# Patient Record
Sex: Male | Born: 1945
Health system: Southern US, Community
[De-identification: ages and names within clinical notes are randomized; demographics above are authoritative.]

## PROBLEM LIST (undated history)

## (undated) DIAGNOSIS — E785 Hyperlipidemia, unspecified: Secondary | ICD-10-CM

## (undated) DIAGNOSIS — I2510 Atherosclerotic heart disease of native coronary artery without angina pectoris: Secondary | ICD-10-CM

## (undated) DIAGNOSIS — I219 Acute myocardial infarction, unspecified: Secondary | ICD-10-CM

## (undated) DIAGNOSIS — I10 Essential (primary) hypertension: Secondary | ICD-10-CM

## (undated) HISTORY — PX: CORONARY ARTERY BYPASS GRAFT: SHX141

---

## 2000-09-10 ENCOUNTER — Encounter: Payer: Self-pay | Admitting: General Surgery

## 2000-09-12 ENCOUNTER — Ambulatory Visit (HOSPITAL_COMMUNITY): Admission: RE | Admit: 2000-09-12 | Discharge: 2000-09-12 | Payer: Self-pay | Admitting: General Surgery

## 2014-06-13 ENCOUNTER — Other Ambulatory Visit: Payer: Self-pay

## 2014-06-13 ENCOUNTER — Other Ambulatory Visit: Payer: Self-pay | Admitting: Surgery

## 2014-06-13 DIAGNOSIS — R222 Localized swelling, mass and lump, trunk: Secondary | ICD-10-CM

## 2014-06-13 DIAGNOSIS — M549 Dorsalgia, unspecified: Secondary | ICD-10-CM

## 2014-06-13 DIAGNOSIS — R1012 Left upper quadrant pain: Secondary | ICD-10-CM

## 2014-06-17 ENCOUNTER — Ambulatory Visit
Admission: RE | Admit: 2014-06-17 | Discharge: 2014-06-17 | Disposition: A | Discharge status: Home / Self Care | Payer: Medicare Other | Source: Ambulatory Visit | Attending: Surgery | Admitting: Surgery

## 2014-06-17 MED ORDER — IOPAMIDOL (ISOVUE-300) INJECTION 61%
100.0000 mL | Freq: Once | INTRAVENOUS | Status: AC | PRN
  Administered 2014-06-17: 100 mL via INTRAVENOUS

## 2014-07-20 ENCOUNTER — Telehealth: Payer: Self-pay

## 2014-07-20 NOTE — Telephone Encounter (Signed)
Rec'd from [place] 6 pages to Historical Provider

## 2014-07-21 ENCOUNTER — Telehealth: Payer: Self-pay | Admitting: Internal Medicine

## 2014-07-21 NOTE — Telephone Encounter (Signed)
Records received from [facility] and placed on Dr. Celesta Aver desk for review

## 2014-08-08 ENCOUNTER — Encounter: Payer: Self-pay | Admitting: Internal Medicine

## 2014-10-13 ENCOUNTER — Ambulatory Visit: Payer: Medicare Other | Admitting: Internal Medicine

## 2015-06-23 DIAGNOSIS — H02823 Cysts of right eye, unspecified eyelid: Secondary | ICD-10-CM | POA: Diagnosis not present

## 2016-08-08 DIAGNOSIS — D2261 Melanocytic nevi of right upper limb, including shoulder: Secondary | ICD-10-CM | POA: Diagnosis not present

## 2016-08-08 DIAGNOSIS — D485 Neoplasm of uncertain behavior of skin: Secondary | ICD-10-CM | POA: Diagnosis not present

## 2016-08-08 DIAGNOSIS — L720 Epidermal cyst: Secondary | ICD-10-CM | POA: Diagnosis not present

## 2016-08-08 DIAGNOSIS — L570 Actinic keratosis: Secondary | ICD-10-CM | POA: Diagnosis not present

## 2016-08-08 DIAGNOSIS — C44319 Basal cell carcinoma of skin of other parts of face: Secondary | ICD-10-CM | POA: Diagnosis not present

## 2016-08-08 DIAGNOSIS — L821 Other seborrheic keratosis: Secondary | ICD-10-CM | POA: Diagnosis not present

## 2016-09-05 DIAGNOSIS — Z85828 Personal history of other malignant neoplasm of skin: Secondary | ICD-10-CM | POA: Diagnosis not present

## 2016-09-05 DIAGNOSIS — C44319 Basal cell carcinoma of skin of other parts of face: Secondary | ICD-10-CM | POA: Diagnosis not present

## 2016-12-16 ENCOUNTER — Inpatient Hospital Stay (HOSPITAL_COMMUNITY)
Admission: EM | Admit: 2016-12-16 | Discharge: 2016-12-19 | DRG: 280 | Disposition: A | Discharge status: Home Health | Payer: Medicare Other | Attending: Cardiology | Admitting: Cardiology

## 2016-12-16 ENCOUNTER — Emergency Department (HOSPITAL_COMMUNITY): Payer: Medicare Other

## 2016-12-16 ENCOUNTER — Encounter (HOSPITAL_COMMUNITY): Payer: Self-pay | Admitting: Pharmacy Technician

## 2016-12-16 ENCOUNTER — Inpatient Hospital Stay (HOSPITAL_COMMUNITY)
Admission: EM | Disposition: A | Discharge status: Home Health | Payer: Self-pay | Source: Home / Self Care | Attending: Cardiology

## 2016-12-16 DIAGNOSIS — I214 Non-ST elevation (NSTEMI) myocardial infarction: Secondary | ICD-10-CM | POA: Diagnosis not present

## 2016-12-16 DIAGNOSIS — R079 Chest pain, unspecified: Secondary | ICD-10-CM | POA: Diagnosis not present

## 2016-12-16 DIAGNOSIS — E782 Mixed hyperlipidemia: Secondary | ICD-10-CM | POA: Diagnosis not present

## 2016-12-16 DIAGNOSIS — I5021 Acute systolic (congestive) heart failure: Secondary | ICD-10-CM | POA: Diagnosis not present

## 2016-12-16 DIAGNOSIS — Z8249 Family history of ischemic heart disease and other diseases of the circulatory system: Secondary | ICD-10-CM | POA: Diagnosis not present

## 2016-12-16 DIAGNOSIS — E785 Hyperlipidemia, unspecified: Secondary | ICD-10-CM | POA: Diagnosis present

## 2016-12-16 DIAGNOSIS — N179 Acute kidney failure, unspecified: Secondary | ICD-10-CM | POA: Diagnosis not present

## 2016-12-16 DIAGNOSIS — I161 Hypertensive emergency: Secondary | ICD-10-CM | POA: Diagnosis not present

## 2016-12-16 DIAGNOSIS — I4729 Other ventricular tachycardia: Secondary | ICD-10-CM

## 2016-12-16 DIAGNOSIS — I160 Hypertensive urgency: Secondary | ICD-10-CM | POA: Diagnosis present

## 2016-12-16 DIAGNOSIS — Z951 Presence of aortocoronary bypass graft: Secondary | ICD-10-CM | POA: Diagnosis not present

## 2016-12-16 DIAGNOSIS — I25110 Atherosclerotic heart disease of native coronary artery with unstable angina pectoris: Secondary | ICD-10-CM | POA: Diagnosis not present

## 2016-12-16 DIAGNOSIS — I340 Nonrheumatic mitral (valve) insufficiency: Secondary | ICD-10-CM | POA: Diagnosis present

## 2016-12-16 DIAGNOSIS — G92 Toxic encephalopathy: Secondary | ICD-10-CM | POA: Diagnosis not present

## 2016-12-16 DIAGNOSIS — I2510 Atherosclerotic heart disease of native coronary artery without angina pectoris: Secondary | ICD-10-CM | POA: Diagnosis not present

## 2016-12-16 DIAGNOSIS — F17210 Nicotine dependence, cigarettes, uncomplicated: Secondary | ICD-10-CM | POA: Diagnosis present

## 2016-12-16 DIAGNOSIS — T4275XA Adverse effect of unspecified antiepileptic and sedative-hypnotic drugs, initial encounter: Secondary | ICD-10-CM | POA: Diagnosis not present

## 2016-12-16 DIAGNOSIS — E1165 Type 2 diabetes mellitus with hyperglycemia: Secondary | ICD-10-CM | POA: Diagnosis present

## 2016-12-16 DIAGNOSIS — I249 Acute ischemic heart disease, unspecified: Secondary | ICD-10-CM

## 2016-12-16 DIAGNOSIS — I252 Old myocardial infarction: Secondary | ICD-10-CM

## 2016-12-16 DIAGNOSIS — I440 Atrioventricular block, first degree: Secondary | ICD-10-CM | POA: Diagnosis present

## 2016-12-16 DIAGNOSIS — Z720 Tobacco use: Secondary | ICD-10-CM | POA: Diagnosis not present

## 2016-12-16 DIAGNOSIS — I472 Ventricular tachycardia, unspecified: Secondary | ICD-10-CM

## 2016-12-16 DIAGNOSIS — I213 ST elevation (STEMI) myocardial infarction of unspecified site: Secondary | ICD-10-CM

## 2016-12-16 DIAGNOSIS — E876 Hypokalemia: Secondary | ICD-10-CM | POA: Diagnosis not present

## 2016-12-16 DIAGNOSIS — N183 Chronic kidney disease, stage 3 (moderate): Secondary | ICD-10-CM | POA: Diagnosis present

## 2016-12-16 DIAGNOSIS — I130 Hypertensive heart and chronic kidney disease with heart failure and stage 1 through stage 4 chronic kidney disease, or unspecified chronic kidney disease: Principal | ICD-10-CM | POA: Diagnosis present

## 2016-12-16 DIAGNOSIS — I25810 Atherosclerosis of coronary artery bypass graft(s) without angina pectoris: Secondary | ICD-10-CM | POA: Diagnosis present

## 2016-12-16 DIAGNOSIS — I493 Ventricular premature depolarization: Secondary | ICD-10-CM | POA: Diagnosis present

## 2016-12-16 DIAGNOSIS — Z978 Presence of other specified devices: Secondary | ICD-10-CM

## 2016-12-16 DIAGNOSIS — Z9119 Patient's noncompliance with other medical treatment and regimen: Secondary | ICD-10-CM

## 2016-12-16 DIAGNOSIS — Z4682 Encounter for fitting and adjustment of non-vascular catheter: Secondary | ICD-10-CM | POA: Diagnosis not present

## 2016-12-16 DIAGNOSIS — J9690 Respiratory failure, unspecified, unspecified whether with hypoxia or hypercapnia: Secondary | ICD-10-CM | POA: Diagnosis present

## 2016-12-16 DIAGNOSIS — E1122 Type 2 diabetes mellitus with diabetic chronic kidney disease: Secondary | ICD-10-CM | POA: Diagnosis present

## 2016-12-16 DIAGNOSIS — J9601 Acute respiratory failure with hypoxia: Secondary | ICD-10-CM | POA: Diagnosis present

## 2016-12-16 DIAGNOSIS — D72829 Elevated white blood cell count, unspecified: Secondary | ICD-10-CM | POA: Diagnosis present

## 2016-12-16 HISTORY — DX: Essential (primary) hypertension: I10

## 2016-12-16 HISTORY — DX: Atherosclerotic heart disease of native coronary artery without angina pectoris: I25.10

## 2016-12-16 HISTORY — DX: Acute myocardial infarction, unspecified: I21.9

## 2016-12-16 HISTORY — DX: Hyperlipidemia, unspecified: E78.5

## 2016-12-16 HISTORY — PX: LEFT HEART CATH AND CORONARY ANGIOGRAPHY: CATH118249

## 2016-12-16 LAB — POCT I-STAT 3, ART BLOOD GAS (G3+)
ACID-BASE DEFICIT: 3 mmol/L — AB (ref 0.0–2.0)
BICARBONATE: 23.3 mmol/L (ref 20.0–28.0)
O2 Saturation: 100 %
PH ART: 7.342 — AB (ref 7.350–7.450)
PO2 ART: 179 mmHg — AB (ref 83.0–108.0)
Patient temperature: 98.1
TCO2: 25 mmol/L (ref 22–32)
pCO2 arterial: 42.9 mmHg (ref 32.0–48.0)

## 2016-12-16 LAB — URINALYSIS, ROUTINE W REFLEX MICROSCOPIC
Bilirubin Urine: NEGATIVE
GLUCOSE, UA: NEGATIVE mg/dL
Hgb urine dipstick: NEGATIVE
KETONES UR: NEGATIVE mg/dL
LEUKOCYTES UA: NEGATIVE
NITRITE: NEGATIVE
PROTEIN: NEGATIVE mg/dL
Specific Gravity, Urine: 1.019 (ref 1.005–1.030)
pH: 5 (ref 5.0–8.0)

## 2016-12-16 LAB — POCT ACTIVATED CLOTTING TIME: Activated Clotting Time: 109 seconds

## 2016-12-16 LAB — LIPID PANEL
Cholesterol: 232 mg/dL — ABNORMAL HIGH (ref 0–200)
HDL: 35 mg/dL — ABNORMAL LOW (ref >40)
LDL Cholesterol: 176 mg/dL — ABNORMAL HIGH (ref 0–99)
TRIGLYCERIDES: 106 mg/dL (ref <150)
Total CHOL/HDL Ratio: 6.6 RATIO
VLDL: 21 mg/dL (ref 0–40)

## 2016-12-16 LAB — CBC WITH DIFFERENTIAL/PLATELET
Basophils Absolute: 0.1 10*3/uL (ref 0.0–0.1)
Basophils Relative: 1 %
EOS ABS: 0.1 10*3/uL (ref 0.0–0.7)
Eosinophils Relative: 0 %
HEMATOCRIT: 55.1 % — AB (ref 39.0–52.0)
HEMOGLOBIN: 18.4 g/dL — AB (ref 13.0–17.0)
LYMPHS ABS: 1.4 10*3/uL (ref 0.7–4.0)
LYMPHS PCT: 12 %
MCH: 32 pg (ref 26.0–34.0)
MCHC: 33.4 g/dL (ref 30.0–36.0)
MCV: 95.8 fL (ref 78.0–100.0)
MONOS PCT: 6 %
Monocytes Absolute: 0.7 10*3/uL (ref 0.1–1.0)
NEUTROS PCT: 81 %
Neutro Abs: 9.4 10*3/uL — ABNORMAL HIGH (ref 1.7–7.7)
Platelets: 153 10*3/uL (ref 150–400)
RBC: 5.75 MIL/uL (ref 4.22–5.81)
RDW: 13.5 % (ref 11.5–15.5)
WBC: 11.6 10*3/uL — AB (ref 4.0–10.5)

## 2016-12-16 LAB — TRIGLYCERIDES: Triglycerides: 105 mg/dL (ref <150)

## 2016-12-16 LAB — GLUCOSE, CAPILLARY
GLUCOSE-CAPILLARY: 219 mg/dL — AB (ref 65–99)
GLUCOSE-CAPILLARY: 150 mg/dL — AB (ref 65–99)

## 2016-12-16 LAB — COMPREHENSIVE METABOLIC PANEL
ALBUMIN: 4.4 g/dL (ref 3.5–5.0)
ALK PHOS: 126 U/L (ref 38–126)
ALT: 33 U/L (ref 17–63)
ANION GAP: 15 (ref 5–15)
AST: 31 U/L (ref 15–41)
BILIRUBIN TOTAL: 1.2 mg/dL (ref 0.3–1.2)
BUN: 9 mg/dL (ref 6–20)
CALCIUM: 9.1 mg/dL (ref 8.9–10.3)
CO2: 19 mmol/L — AB (ref 22–32)
CREATININE: 1.43 mg/dL — AB (ref 0.61–1.24)
Chloride: 101 mmol/L (ref 101–111)
GFR calc Af Amer: 55 mL/min — ABNORMAL LOW (ref >60)
GFR calc non Af Amer: 48 mL/min — ABNORMAL LOW (ref >60)
GLUCOSE: 219 mg/dL — AB (ref 65–99)
Potassium: 4.3 mmol/L (ref 3.5–5.1)
SODIUM: 135 mmol/L (ref 135–145)
TOTAL PROTEIN: 7.6 g/dL (ref 6.5–8.1)

## 2016-12-16 LAB — POCT I-STAT, CHEM 8
BUN: 13 mg/dL (ref 6–20)
CHLORIDE: 103 mmol/L (ref 101–111)
CREATININE: 1.2 mg/dL (ref 0.61–1.24)
Calcium, Ion: 1.14 mmol/L — ABNORMAL LOW (ref 1.15–1.40)
Glucose, Bld: 314 mg/dL — ABNORMAL HIGH (ref 65–99)
HEMATOCRIT: 51 % (ref 39.0–52.0)
Hemoglobin: 17.3 g/dL — ABNORMAL HIGH (ref 13.0–17.0)
Potassium: 4.5 mmol/L (ref 3.5–5.1)
Sodium: 137 mmol/L (ref 135–145)
TCO2: 23 mmol/L (ref 22–32)

## 2016-12-16 LAB — MAGNESIUM: MAGNESIUM: 2.1 mg/dL (ref 1.7–2.4)

## 2016-12-16 LAB — APTT: aPTT: 28 seconds (ref 24–36)

## 2016-12-16 LAB — PROTIME-INR
INR: 1.02
Prothrombin Time: 13.3 seconds (ref 11.4–15.2)

## 2016-12-16 LAB — HEMOGLOBIN A1C
Hgb A1c MFr Bld: 7 % — ABNORMAL HIGH (ref 4.8–5.6)
Mean Plasma Glucose: 154.2 mg/dL

## 2016-12-16 LAB — TROPONIN I: Troponin I: 0.05 ng/mL (ref <0.03)

## 2016-12-16 SURGERY — LEFT HEART CATH AND CORONARY ANGIOGRAPHY
Anesthesia: LOCAL

## 2016-12-16 MED ORDER — PROPOFOL 1000 MG/100ML IV EMUL
5.0000 ug/kg/min | INTRAVENOUS | Status: DC
  Filled 2016-12-16: qty 100

## 2016-12-16 MED ORDER — ACETAMINOPHEN 325 MG PO TABS
650.0000 mg | ORAL_TABLET | ORAL | Status: DC | PRN
  Administered 2016-12-17 – 2016-12-18 (×3): 650 mg via ORAL
  Filled 2016-12-16 (×3): qty 2

## 2016-12-16 MED ORDER — FENTANYL CITRATE (PF) 100 MCG/2ML IJ SOLN: 50.0000 ug | INTRAMUSCULAR | Status: DC | PRN

## 2016-12-16 MED ORDER — SODIUM CHLORIDE 0.9 % IV SOLN: 250.0000 mL | INTRAVENOUS | Status: DC | PRN

## 2016-12-16 MED ORDER — ASPIRIN 81 MG PO CHEW: 324.0000 mg | CHEWABLE_TABLET | Freq: Once | ORAL | Status: DC

## 2016-12-16 MED ORDER — HEPARIN SODIUM (PORCINE) 5000 UNIT/ML IJ SOLN
4000.0000 [IU] | Freq: Once | INTRAMUSCULAR | Status: AC
  Administered 2016-12-16: 4000 [IU] via INTRAVENOUS

## 2016-12-16 MED ORDER — PROPOFOL 1000 MG/100ML IV EMUL
INTRAVENOUS | Status: AC
  Administered 2016-12-16 (×2)
  Filled 2016-12-16: qty 100

## 2016-12-16 MED ORDER — PROPOFOL 1000 MG/100ML IV EMUL
5.0000 ug/kg/min | INTRAVENOUS | Status: DC
  Administered 2016-12-16: 20 ug/kg/min via INTRAVENOUS
  Administered 2016-12-16: 30 ug/kg/min via INTRAVENOUS
  Administered 2016-12-16: 40 ug/kg/min via INTRAVENOUS
  Administered 2016-12-17: 30 ug/kg/min via INTRAVENOUS
  Filled 2016-12-16 (×2): qty 100

## 2016-12-16 MED ORDER — CHLORHEXIDINE GLUCONATE 0.12% ORAL RINSE (MEDLINE KIT)
15.0000 mL | Freq: Two times a day (BID) | OROMUCOSAL | Status: DC
  Administered 2016-12-16 – 2016-12-18 (×3): 15 mL via OROMUCOSAL

## 2016-12-16 MED ORDER — IOPAMIDOL (ISOVUE-370) INJECTION 76%
INTRAVENOUS | Status: DC | PRN
  Administered 2016-12-16: 135 mL via INTRA_ARTERIAL

## 2016-12-16 MED ORDER — NITROGLYCERIN 1 MG/10 ML FOR IR/CATH LAB
INTRA_ARTERIAL | Status: AC
  Filled 2016-12-16: qty 10

## 2016-12-16 MED ORDER — VERAPAMIL HCL 2.5 MG/ML IV SOLN
INTRAVENOUS | Status: AC
  Filled 2016-12-16: qty 2

## 2016-12-16 MED ORDER — IOPAMIDOL (ISOVUE-370) INJECTION 76%
INTRAVENOUS | Status: AC
Start: 2016-12-16 — End: ?
  Filled 2016-12-16: qty 100

## 2016-12-16 MED ORDER — LIDOCAINE HCL (PF) 1 % IJ SOLN
INTRAMUSCULAR | Status: DC | PRN
  Administered 2016-12-16: 15 mL via INTRADERMAL

## 2016-12-16 MED ORDER — SUCCINYLCHOLINE CHLORIDE 20 MG/ML IJ SOLN
INTRAMUSCULAR | Status: AC | PRN
  Administered 2016-12-16: 100 mg via INTRAVENOUS

## 2016-12-16 MED ORDER — SUCCINYLCHOLINE CHLORIDE 20 MG/ML IJ SOLN
100.0000 mg | Freq: Once | INTRAMUSCULAR | Status: AC
  Administered 2016-12-16: 100 mg via INTRAVENOUS

## 2016-12-16 MED ORDER — ETOMIDATE 2 MG/ML IV SOLN
20.0000 mg | Freq: Once | INTRAVENOUS | Status: AC
  Administered 2016-12-16: 20 mg via INTRAVENOUS

## 2016-12-16 MED ORDER — PANTOPRAZOLE SODIUM 40 MG IV SOLR
40.0000 mg | Freq: Every day | INTRAVENOUS | Status: DC
  Administered 2016-12-16: 40 mg via INTRAVENOUS
  Filled 2016-12-16: qty 40

## 2016-12-16 MED ORDER — SODIUM CHLORIDE 0.9 % IV SOLN
INTRAVENOUS | Status: DC
  Administered 2016-12-16 (×2): via INTRAVENOUS
  Administered 2016-12-16: 10 mL/h via INTRAVENOUS

## 2016-12-16 MED ORDER — METOPROLOL TARTRATE 5 MG/5ML IV SOLN
5.0000 mg | Freq: Once | INTRAVENOUS | Status: AC
  Administered 2016-12-16: 5 mg via INTRAVENOUS

## 2016-12-16 MED ORDER — NITROGLYCERIN IN D5W 200-5 MCG/ML-% IV SOLN
INTRAVENOUS | Status: AC
  Filled 2016-12-16: qty 250

## 2016-12-16 MED ORDER — ONDANSETRON HCL 4 MG/2ML IJ SOLN: 4.0000 mg | Freq: Four times a day (QID) | INTRAMUSCULAR | Status: DC | PRN

## 2016-12-16 MED ORDER — BISACODYL 10 MG RE SUPP: 10.0000 mg | Freq: Every day | RECTAL | Status: DC | PRN

## 2016-12-16 MED ORDER — HEPARIN (PORCINE) IN NACL 100-0.45 UNIT/ML-% IJ SOLN
1500.0000 [IU]/h | INTRAMUSCULAR | Status: DC
  Administered 2016-12-17: 1250 [IU]/h via INTRAVENOUS
  Administered 2016-12-17: 1500 [IU]/h via INTRAVENOUS
  Filled 2016-12-16 (×2): qty 250

## 2016-12-16 MED ORDER — HEPARIN (PORCINE) IN NACL 2-0.9 UNIT/ML-% IJ SOLN
INTRAMUSCULAR | Status: AC | PRN
  Administered 2016-12-16: 1500 mL

## 2016-12-16 MED ORDER — SODIUM CHLORIDE 0.9% FLUSH: 3.0000 mL | INTRAVENOUS | Status: DC | PRN

## 2016-12-16 MED ORDER — NITROGLYCERIN IN D5W 200-5 MCG/ML-% IV SOLN
10.0000 ug/min | INTRAVENOUS | Status: DC
  Administered 2016-12-16: 10 ug/min via INTRAVENOUS

## 2016-12-16 MED ORDER — HEPARIN (PORCINE) IN NACL 2-0.9 UNIT/ML-% IJ SOLN
INTRAMUSCULAR | Status: AC
  Filled 2016-12-16: qty 1000

## 2016-12-16 MED ORDER — FUROSEMIDE 10 MG/ML IJ SOLN
40.0000 mg | Freq: Once | INTRAMUSCULAR | Status: AC
  Administered 2016-12-16: 40 mg via INTRAVENOUS

## 2016-12-16 MED ORDER — IPRATROPIUM-ALBUTEROL 0.5-2.5 (3) MG/3ML IN SOLN: 3.0000 mL | RESPIRATORY_TRACT | Status: DC | PRN

## 2016-12-16 MED ORDER — AMIODARONE HCL IN DEXTROSE 360-4.14 MG/200ML-% IV SOLN
30.0000 mg/h | INTRAVENOUS | Status: DC
  Administered 2016-12-17: 30 mg/h via INTRAVENOUS
  Filled 2016-12-16: qty 200

## 2016-12-16 MED ORDER — LIDOCAINE HCL (PF) 1 % IJ SOLN
INTRAMUSCULAR | Status: AC
  Filled 2016-12-16: qty 30

## 2016-12-16 MED ORDER — ATORVASTATIN CALCIUM 80 MG PO TABS
80.0000 mg | ORAL_TABLET | Freq: Every day | ORAL | Status: DC
  Administered 2016-12-17 – 2016-12-18 (×2): 80 mg via ORAL
  Filled 2016-12-16 (×2): qty 1

## 2016-12-16 MED ORDER — AMIODARONE HCL IN DEXTROSE 360-4.14 MG/200ML-% IV SOLN
60.0000 mg/h | INTRAVENOUS | Status: AC
  Administered 2016-12-16 (×2): 60 mg/h via INTRAVENOUS
  Filled 2016-12-16: qty 200

## 2016-12-16 MED ORDER — AMIODARONE LOAD VIA INFUSION
150.0000 mg | Freq: Once | INTRAVENOUS | Status: AC
  Administered 2016-12-16: 150 mg via INTRAVENOUS
  Filled 2016-12-16: qty 83.34

## 2016-12-16 MED ORDER — ASPIRIN EC 81 MG PO TBEC
81.0000 mg | DELAYED_RELEASE_TABLET | Freq: Every day | ORAL | Status: DC
  Administered 2016-12-17 – 2016-12-19 (×3): 81 mg via ORAL
  Filled 2016-12-16 (×3): qty 1

## 2016-12-16 MED ORDER — IOPAMIDOL (ISOVUE-370) INJECTION 76%
INTRAVENOUS | Status: AC
  Filled 2016-12-16: qty 125

## 2016-12-16 MED ORDER — DOCUSATE SODIUM 50 MG/5ML PO LIQD
100.0000 mg | Freq: Two times a day (BID) | ORAL | Status: DC | PRN
  Filled 2016-12-16: qty 10

## 2016-12-16 MED ORDER — FUROSEMIDE 10 MG/ML IJ SOLN
40.0000 mg | Freq: Two times a day (BID) | INTRAMUSCULAR | Status: DC
  Administered 2016-12-16 – 2016-12-17 (×2): 40 mg via INTRAVENOUS
  Filled 2016-12-16 (×2): qty 4

## 2016-12-16 MED ORDER — NITROGLYCERIN 0.4 MG SL SUBL: 0.4000 mg | SUBLINGUAL_TABLET | SUBLINGUAL | Status: DC | PRN

## 2016-12-16 MED ORDER — FUROSEMIDE 10 MG/ML IJ SOLN
INTRAMUSCULAR | Status: AC
  Administered 2016-12-16: 18:00:00
  Filled 2016-12-16: qty 4

## 2016-12-16 MED ORDER — INSULIN ASPART 100 UNIT/ML ~~LOC~~ SOLN
1.0000 [IU] | SUBCUTANEOUS | Status: DC
  Administered 2016-12-16: 3 [IU] via SUBCUTANEOUS
  Administered 2016-12-16: 1 [IU] via SUBCUTANEOUS

## 2016-12-16 MED ORDER — SODIUM CHLORIDE 0.9% FLUSH
3.0000 mL | Freq: Two times a day (BID) | INTRAVENOUS | Status: DC
  Administered 2016-12-16 – 2016-12-19 (×6): 3 mL via INTRAVENOUS

## 2016-12-16 MED ORDER — ORAL CARE MOUTH RINSE
15.0000 mL | Freq: Four times a day (QID) | OROMUCOSAL | Status: DC
  Administered 2016-12-16 – 2016-12-18 (×3): 15 mL via OROMUCOSAL

## 2016-12-16 MED ORDER — ETOMIDATE 2 MG/ML IV SOLN
INTRAVENOUS | Status: AC | PRN
  Administered 2016-12-16: 20 mg via INTRAVENOUS

## 2016-12-16 SURGICAL SUPPLY — 15 items
CATH INFINITI 5 FR RCB (CATHETERS) ×1 IMPLANT
CATH INFINITI 5FR AL1 (CATHETERS) ×1 IMPLANT
CATH INFINITI 5FR ANG PIGTAIL (CATHETERS) ×1 IMPLANT
CATH INFINITI 5FR JL4 (CATHETERS) ×1 IMPLANT
CATH INFINITI JR4 5F (CATHETERS) ×1 IMPLANT
ELECT DEFIB PAD ADLT CADENCE (PAD) ×1 IMPLANT
HOVERMATT SINGLE USE (MISCELLANEOUS) ×1 IMPLANT
KIT ENCORE 26 ADVANTAGE (KITS) ×1 IMPLANT
KIT HEART LEFT (KITS) ×2 IMPLANT
PACK CARDIAC CATHETERIZATION (CUSTOM PROCEDURE TRAY) ×2 IMPLANT
SHEATH PINNACLE 6F 10CM (SHEATH) ×1 IMPLANT
SYR MEDRAD MARK V 150ML (SYRINGE) ×2 IMPLANT
TRANSDUCER W/STOPCOCK (MISCELLANEOUS) ×2 IMPLANT
TUBING CIL FLEX 10 FLL-RA (TUBING) ×2 IMPLANT
WIRE EMERALD 3MM-J .035X150CM (WIRE) ×1 IMPLANT

## 2016-12-16 NOTE — Progress Notes (Signed)
Patient transported from cath lab to 2H01 without any complications.

## 2016-12-16 NOTE — Plan of Care (Signed)
  Progressing Cardiovascular: Ability to achieve and maintain adequate cardiovascular perfusion will improve 12/16/2016 2353 - Progressing by Reinaldo Berber, RN   Not Progressing Activity: Ability to return to baseline activity level will improve 12/16/2016 2353 - Not Progressing by Reinaldo Berber, RN Note Pt is on bedrest as he is intubated tonight   Completed/Met Cardiovascular: Vascular access site(s) Level 0-1 will be maintained 12/16/2016 2353 - Completed/Met by Reinaldo Berber, RN

## 2016-12-16 NOTE — ED Provider Notes (Addendum)
[facility] CATH LAB Provider Note   CSN: [id number] Arrival date & time: 12/16/16  1712     History   Chief Complaint Chief Complaint  Patient presents with  . Abnormal ECG  . Chest Pain    HPI Luis Donaldson is a 71 y.o. male.  Level 5 caveat for urgent need for intervention and acuity of condition.  Patient presents from home with complaints of chest pain and dyspnea.  According to EMS, he was pale, dyspneic and diaphoretic on initial exam.  Additionally, runs of V. tach were noted.  Patient was urgently transported to the emergency department.  Past medical history includes significant cardiovascular disease including MI x4, CABG, CHF, respiratory failure, nonsustained ventricular tachycardia, many others.      Past Medical History:  Diagnosis Date  . Coronary artery disease   . Hyperlipidemia   . Hypertension   . MI (myocardial infarction) [facility])     Patient Active Problem List   Diagnosis Date Noted  . STEMI (ST elevation myocardial infarction) ([place]) 12/16/2016  . NSVT (nonsustained ventricular tachycardia) ([place]) 12/16/2016  . Acute systolic heart failure ([place]) 12/16/2016  . Respiratory failure ([place]) 12/16/2016  . CAD (coronary artery disease) 12/16/2016  . Hx of CABG 12/16/2016  . HLD (hyperlipidemia) 12/16/2016  . Tobacco use 12/16/2016  . Acute systolic CHF (congestive heart failure) ([place]) 12/16/2016    Past Surgical History:  Procedure Laterality Date  . CORONARY ARTERY BYPASS GRAFT         Home Medications    Prior to Admission medications   [name]    Family History Family History  Problem Relation Age of Onset  . Heart disease Mother     Social History Social History   Tobacco Use  . Smoking status: Current Every Day Smoker    Types: Cigarettes  . Smokeless tobacco: Never Used  Substance Use Topics  . Alcohol use: [name]  . Drug use: [name]     Allergies   Patient has no known  allergies.   Review of Systems Review of Systems  Unable to perform ROS: Acuity of condition     Physical Exam Updated Vital Signs BP 129/86   Pulse (!) 57   Temp 97.9 F (36.6 C) (Oral)   Resp 16   Ht 5\' 9"  (1.753 m)   Wt 95.3 kg (210 lb)   SpO2 98%   BMI 31.01 kg/m   Physical Exam  Constitutional: He is oriented to person, place, and time.  Dyspneic, diaphoretic  HENT:  Head: Normocephalic and atraumatic.  Eyes: Conjunctivae are normal.  Neck: Neck supple.  Cardiovascular:  No obvious ST elevation; intermittent runs of ventricular tachycardia.  Pulmonary/Chest: Effort normal and breath sounds normal.  Abdominal: Soft. Bowel sounds are normal.  Musculoskeletal: Normal range of motion.  Neurological: He is alert and oriented to person, place, and time.  Skin: Skin is warm and dry.  Psychiatric: He has a normal mood and affect. His behavior is normal.  Nursing note and vitals reviewed.    ED Treatments / Results  Labs (all labs ordered are listed, but only abnormal results are displayed) Labs Reviewed  CBC WITH DIFFERENTIAL/PLATELET - Abnormal; Notable for the following components:      Result Value   WBC 11.6 (*)    Hemoglobin 18.4 (*)    HCT 55.1 (*)    Neutro Abs 9.4 (*)    All other components within normal limits  COMPREHENSIVE METABOLIC PANEL - Abnormal; Notable for the following components:   CO2 19 (*)    Glucose, Bld 219 (*)    Creatinine, Ser 1.43 (*)    GFR calc non Af Amer 48 (*)    GFR calc Af Amer 55 (*)    All other components within normal limits  TROPONIN I - Abnormal; Notable for the following components:   Troponin I 0.05 (*)    All other components within normal limits  LIPID PANEL - Abnormal; Notable for the following components:   Cholesterol 232 (*)    HDL 35 (*)    LDL Cholesterol 176 (*)    All other components within normal limits  POCT I-STAT, CHEM 8 - Abnormal; Notable for the following components:   Glucose, Bld 314 (*)      Calcium, Ion 1.14 (*)    Hemoglobin 17.3 (*)    All other components within normal limits  PROTIME-INR  APTT  TRIGLYCERIDES  TRIGLYCERIDES  MAGNESIUM  POCT ACTIVATED CLOTTING TIME    EKG  EKG Interpretation None       Radiology Dg Chest Port 1 View  Result Date: 12/16/2016 CLINICAL DATA:  Endotracheal tube placement. EXAM: PORTABLE CHEST 1 VIEW COMPARISON:  None. FINDINGS: Mild cardiomegaly is noted. Endotracheal tube is seen projected over tracheal air shadow with distal tip 7 cm above the carina. Nasogastric tube is seen entering the stomach. No pneumothorax or pleural effusion is noted. Mild central pulmonary vascular congestion and possible perihilar edema is noted. Bony thorax is unremarkable. IMPRESSION: Endotracheal and nasogastric tubes in grossly good position. Mild cardiomegaly with central pulmonary vascular congestion is noted. Possible bilateral perihilar edema. Electronically Signed   By: Marijo Conception, M.D.   On: 12/16/2016 18:24    Procedures......[1] rapid sequence intubation with pre-medication of etomidate 20 mg followed by succinylcholine 100 mg.  This was repeated x1.    [2]    Intubation performed by Dr Tanna Furry Procedure Name: Intubation Date/Time: 12/16/2016 8:13 PM Performed by: Nat Christen, MD Pre-anesthesia Checklist: Patient identified, Patient being monitored, Emergency Drugs available, Timeout performed and Suction available Oxygen Delivery Method: Non-rebreather mask Preoxygenation: Pre-oxygenation with 100% oxygen Induction Type: Rapid sequence Ventilation: Mask ventilation without difficulty Laryngoscope Size: 3 Grade View: Grade III Tube size: 7.5 mm Number of attempts: 2 Airway Equipment and Method: Video-laryngoscopy Placement Confirmation: ETT inserted through vocal cords under direct vision,  CO2 detector and Breath sounds checked- equal and bilateral Tube secured with: ETT holder Dental Injury: Teeth and Oropharynx as per  pre-operative assessment  Difficulty Due To: Difficulty was unanticipated and Difficult Airway- due to anterior larynx      (including critical care time)  Medications Ordered in ED Medications  0.9 %  sodium chloride infusion (10 mL/hr Intravenous New Bag/Given 12/16/16 1748)  aspirin chewable tablet 324 mg ( Oral MAR Hold 12/16/16 1845)  nitroGLYCERIN 50 mg in dextrose 5 % 250 mL (0.2 mg/mL) infusion (5 mcg/min Intravenous Restarted 12/16/16 1927)  metoprolol tartrate (LOPRESSOR) injection 5 mg ( Intravenous MAR Hold 12/16/16 1845)  amiodarone (NEXTERONE PREMIX) 360-4.14 MG/200ML-% (1.8 mg/mL) IV infusion (60 mg/hr Intravenous New Bag/Given 12/16/16 1746)  amiodarone (NEXTERONE PREMIX) 360-4.14 MG/200ML-% (1.8 mg/mL) IV infusion (not administered)  furosemide (LASIX) 10 MG/ML injection (not administered)  propofol (DIPRIVAN) 1000 MG/100ML infusion (30 mcg/kg/min  95.3 kg Intravenous New Bag/Given 12/16/16 1929)  iopamidol (ISOVUE-370) 76 % injection (135 mLs Intra-arterial Given 12/16/16 1922)  lidocaine (PF) (XYLOCAINE) 1 % injection (15 mLs  Intradermal Given 12/16/16 1843)  heparin infusion 2 units/mL in 0.9 % sodium chloride (1,500 mLs Other New Bag/Given 12/16/16 1923)  heparin injection 4,000 Units (4,000 Units Intravenous Given 12/16/16 1741)  furosemide (LASIX) injection 40 mg (40 mg Intravenous Given 12/16/16 1746)  amiodarone (NEXTERONE) 1.8 mg/mL load via infusion 150 mg (150 mg Intravenous Bolus from Bag 12/16/16 1747)  etomidate (AMIDATE) injection 20 mg (20 mg Intravenous Given 12/16/16 1805)  succinylcholine (ANECTINE) injection 100 mg (100 mg Intravenous Given 12/16/16 1806)  etomidate (AMIDATE) injection (20 mg Intravenous Given 12/16/16 1755)  succinylcholine (ANECTINE) injection (100 mg Intravenous Given 12/16/16 1755)  propofol (DIPRIVAN) 1000 MG/100ML infusion (  Bolus from Bag 12/16/16 1909)     Initial Impression / Assessment and Plan / ED Course  I have  reviewed the triage vital signs and the nursing notes.  Pertinent labs & imaging results that were available during my care of the patient were reviewed by me and considered in my medical decision making (see chart for details).  Clinical Course as of Dec 17 2024  Mon Dec 16, 2016  2020 DG Chest [place] 1 View [MJ]    Clinical Course User Index [MJ] Tanna Furry, MD   Patient presents with chest pain and dyspnea.  EKG shows intermittent runs of ventricular tachycardia.  I immediately called a code STEMI.  Cardiologist was in attendance at the bedside.  Aspirin was given prior to arrival.  IV amiodarone, IV heparin and IV Lasix given.  Patient was intubated by Dr. Tanna Furry.  NG tube inserted.  Transfer to Cath Lab.   CRITICAL CARE Performed by: Nat Christen Total critical care time: 50 minutes Critical care time was exclusive of separately billable procedures and treating other patients. Critical care was necessary to treat or prevent imminent or life-threatening deterioration. Critical care was time spent personally by me on the following activities: development of treatment plan with patient and/or surrogate as well as nursing, discussions with consultants, evaluation of patient's response to treatment, examination of patient, obtaining history from patient or surrogate, ordering and performing treatments and interventions, ordering and review of laboratory studies, ordering and review of radiographic studies, pulse oximetry and re-evaluation of patient's condition.   Final Clinical Impressions(s) / ED Diagnoses   Final diagnoses:  Chest pain, unspecified type  Paroxysmal ventricular tachycardia [facility])    ED Discharge Orders    None       Nat Christen, MD 12/16/16 2010    Nat Christen, MD 12/16/16 2012    Nat Christen, MD 12/16/16 2017    Nat Christen, MD 12/17/16 2028

## 2016-12-16 NOTE — ED Provider Notes (Signed)
Procedure note:  Intubation. Indication respiratory failure, unstable cardiac rhythms.  Patient premedicated with succinylcholine 100 mg, etomidate 20 mg. Undergoing bag assisted ventilations by Dr. Lacinda Axon. 92% saturations.  Able to visualize cords using a glide scope. 7.5 ET tube passed on first attempt. Good fogging of the tube. Some blood and secretions were suctioned from the upper airway before placement of the tube. Good fogging the tube. Positive entitle. Saturations go to 97%. Chest x-ray pending.   Tanna Furry, MD 12/16/16 2019

## 2016-12-16 NOTE — Progress Notes (Addendum)
ANTICOAGULATION CONSULT NOTE - Initial Consult  Pharmacy Consult for heparin  Indication: chest pain/ACS  No Known Allergies  Patient Measurements: Height: 5\' 9"  (175.3 cm) Weight: 210 lb (95.3 kg) IBW/kg (Calculated) : 70.7 Heparin Dosing Weight: 90kg  Vital Signs: Temp: 97.9 F (36.6 C) (11/26 1737) Temp Source: Oral (11/26 1737) BP: 129/86 (11/26 1919) Pulse Rate: 57 (11/26 1919)  Labs: Recent Labs    12/16/16 1734 12/16/16 1848  HGB 18.4* 17.3*  HCT 55.1* 51.0  PLT 153  --   APTT 28  --   LABPROT 13.3  --   INR 1.02  --   CREATININE 1.43* 1.20  TROPONINI 0.05*  --     Estimated Creatinine Clearance: 64.3 mL/min (by C-G formula based on SCr of 1.2 mg/dL).   Medical History: Past Medical History:  Diagnosis Date  . Coronary artery disease   . Hyperlipidemia   . Hypertension   . MI (myocardial infarction) ([place])     Medications:  No medications prior to admission.   Scheduled:  . [MAR Hold] aspirin  324 mg Oral Once  . furosemide      . [MAR Hold] metoprolol tartrate  5 mg Intravenous Once    Assessment: 71 yo male s/p STEMI and cath to start heparin 8 hours after sheath removal (removed ~ 7:30pm). -hg= 17.3, plt= 153  Goal of Therapy:  Heparin level 0.3-0.7 units/ml Monitor platelets by anticoagulation protocol: Yes   Plan:  -No heparin bolus -Begin heparin at 1250 units/hr 8 hours after sheath removal -Heparin level in 6 hours and daily wth CBC daily  Hildred Laser, Pharm D 12/16/2016 7:34 PM

## 2016-12-16 NOTE — Consult Note (Signed)
PULMONARY / CRITICAL CARE MEDICINE   Name: Luis Donaldson MRN: [id number] DOB: 1945-08-25    ADMISSION DATE:  12/16/2016 CONSULTATION DATE:  12/16/2016  REFERRING MD:  Dr. [place]  CHIEF COMPLAINT:  STEMI  HISTORY OF PRESENT ILLNESS:  HPI obtained from medical chart review as patient is intubated and sedated.   71 year old male with past medical history of tobacco abuse, CAD, prior MI, and CABG in 2000 and non-compliance with poor medical follow up who presented 11/26 with chest pain.  Patient reported onset of chest pain around 0900 today, intermittent, unrelieved with zantac at home.  His neighbor called 911 this evening, found by EMS in [place] and hypertensive 190/102 with respiratory distress. Treated with lidocaine, aspirin, and lasix for pulmonary edema.    In ER, EKG noted ST elevation in anterior leads and ongoing NSVT.  Placed on amiodarone gtt.  Intubated in the ER and taken emergently for cardiac catheterization.  PCCM consulted for ventilator management.   PAST MEDICAL HISTORY :  He  has a past medical history of Coronary artery disease, Hyperlipidemia, Hypertension, and MI (myocardial infarction) ([place]).  PAST SURGICAL HISTORY: He  has a past surgical history that includes Coronary artery bypass graft.  No Known Allergies  No current facility-administered medications on file prior to encounter.    No current outpatient medications on file prior to encounter.    FAMILY HISTORY:  His indicated that his mother is deceased. He indicated that his father is deceased.   SOCIAL HISTORY: He  reports that he has been smoking cigarettes.  he has never used smokeless tobacco.  REVIEW OF SYSTEMS:   Unable to assess as patient in intubated and sedated.   SUBJECTIVE:  Currently on amiodarone gtt at 60 mg/hr, propofol 30 mcg/kg/min, and nitro gtt at 5 mcg/min   VITAL SIGNS: BP (!) 151/92 (BP Location: Left Arm)   Pulse 70   Temp 98.1 F (36.7 C) (Oral)   Resp 16   Ht 5'  9" (1.753 m)   Wt 210 lb (95.3 kg)   SpO2 99%   BMI 31.01 kg/m   HEMODYNAMICS:    VENTILATOR SETTINGS: Vent Mode: PRVC FiO2 (%):  [100 %] 100 % Set Rate:  [12 bmp-14 bmp] 12 bmp Vt Set:  [570 mL-580 mL] 580 mL PEEP:  [5 cmH20] 5 cmH20 Plateau Pressure:  [21 cmH20-25 cmH20] 21 cmH20  INTAKE / OUTPUT: No intake/output data recorded.  PHYSICAL EXAMINATION: General:  Well nourished male sedated on MV in NAD HEENT: MM pink/moist, pupils 3/=/reactive, ETT at 23 cm at teeth, OGT Neuro: Sedated, opens eyes to voice, MAE CV: ir rrr, distant heart sounds, right groin site dressing CDI, + dp pulses PULM: even/non-labored on MV, lungs bilaterally rhonchi, no wheezes GI: protuberant, soft,  bs active  Extremities: cool/dry, no peripheral edema, left tibial IO Skin: no rashes   LABS:  BMET Recent Labs  Lab 12/16/16 1734 12/16/16 1848  NA 135 137  K 4.3 4.5  CL 101 103  CO2 19*  --   BUN 9 13  CREATININE 1.43* 1.20  GLUCOSE 219* 314*    Electrolytes Recent Labs  Lab 12/16/16 1734  CALCIUM 9.1    CBC Recent Labs  Lab 12/16/16 1734 12/16/16 1848  WBC 11.6*  --   HGB 18.4* 17.3*  HCT 55.1* 51.0  PLT 153  --     Coag's Recent Labs  Lab 12/16/16 1734  APTT 28  INR 1.02    Sepsis Markers  No results for input(s): LATICACIDVEN, PROCALCITON, O2SATVEN in the last 168 hours.  ABG No results for input(s): PHART, PCO2ART, PO2ART in the last 168 hours.  Liver Enzymes Recent Labs  Lab 12/16/16 1734  AST 31  ALT 33  ALKPHOS 126  BILITOT 1.2  ALBUMIN 4.4    Cardiac Enzymes Recent Labs  Lab 12/16/16 1734  TROPONINI 0.05*    Glucose No results for input(s): GLUCAP in the last 168 hours.  Imaging Dg Chest Port 1 View  Result Date: 12/16/2016 CLINICAL DATA:  Endotracheal tube placement. EXAM: PORTABLE CHEST 1 VIEW COMPARISON:  None. FINDINGS: Mild cardiomegaly is noted. Endotracheal tube is seen projected over tracheal air shadow with distal tip 7  cm above the carina. Nasogastric tube is seen entering the stomach. No pneumothorax or pleural effusion is noted. Mild central pulmonary vascular congestion and possible perihilar edema is noted. Bony thorax is unremarkable. IMPRESSION: Endotracheal and nasogastric tubes in grossly good position. Mild cardiomegaly with central pulmonary vascular congestion is noted. Possible bilateral perihilar edema. Electronically Signed   By: Marijo Conception, M.D.   On: 12/16/2016 18:24   STUDIES:  12/16/2016 LHC>> 1.  Severe 3 vessel occlusive CAD 2.  Patent LIMA to the LAD 3.  Patent free radial graft to the OM1 4.  Occluded SVG to the RCA. This appears chronic.  5.  The RCA and first diagonal are filled by collaterals 6.  Severe LV dysfunction. EF estimated at 25-30% 7.  Markedly elevated LVEDP.  CULTURES: MRSA PCR 12/16/2016 >>  ANTIBIOTICS: n/a  SIGNIFICANT EVENTS: 12/16/2016 Admit STEMI, intubated, LHC  LINES/TUBES: PIV x 3 Left tibial IO  ETT 11/26 >> OGT 11/26 >>   DISCUSSION: 89 yoM w/PMH of CAD and prior CABG in 2000 with poor medical compliance presented with onset of CP today found to have anterior STEMI, NSVT, and hypertensive with acute pulmonary edema intubated and taken for emergent cardiac cath.   ASSESSMENT / PLAN:  PULMONARY A: Acute hypoxic respiratory failure secondary to acute pulmonary edema in the setting of STEMI/ hypertensive crisis Tobacco Abuse - CXR with ETT 7 cm above carina, gastric tube in stomach, with pulmonary and bilateral perihilar edema P:   Full MV support 8 cc/kg Wean FiO2/ peep for spo2 > 92 ABG now Trend CXR Lasix as below VAP protocol duonebs prn SBT when stable/ appropiate Tobacco abuse counseling when appropriate   CARDIOVASCULAR A:  Anterior STEMI NSVT Hypertensive Crisis Hx CAD, prior MI, CABG in 2000 - cholesterol 232, LDL 176, HDL 35 - LHC diagnostic only; no culprit lesion found, good collaterals, EF 25-30% P:  Cardiology  Primary Tele monitoring Trend troponin/ EKG BNP pending Amio/ NTG/ heparin gtt per Cards Follow electrolytes  ASA daily  RENAL A:   AKI- unknown baseline sCr P:   S/p lasix 40 mg x 1, and then q 12 hr per Cards Monitor for urinary retention Trend BMP /mag/ phos/ urinary output/ daily weights Replace electrolytes as indicated  GASTROINTESTINAL A:   NPO - LFTs wnl P:   Continue OGT PPI for SUP Bowel regimen with PAD protocol  HEMATOLOGIC A:   Slight leukocytosis Elevated H/H, normal plts P:  Trend CBC for now Systemic heparin as above and SCDs for VTE ppx  INFECTIOUS A:   Leukocytosis- mild, likely reactive P:   Send UA Monitor fever curve/ WBC Consider checking PCT   ENDOCRINE A:   Hyperglycemia P:   CBG q 4 SSI Assess HgbA1c Assess TSH  NEUROLOGIC A:  Acute encephalopathy- related to sedation P:   RASS goal: -1 PAD protocol with propofol and prn fentanyl May need to d/c propofol pending triglycerides  Daily wake up assessment if stable  FAMILY  - Updates: Neighbor of 20+ years at bedside, Darrold Span [phone], reports patient not feeling well for probably a month.  Reports patient previously divorced with no known children.  Does not know of any family or emergency contacts.    - Inter-disciplinary family meet or Palliative Care meeting due by: 12/23/2016   CCT 45 mins  Kennieth Rad, AGACNP-BC [facility] Pgr: [phone] or if no answer [phone] 12/16/2016, 8:37 PM

## 2016-12-16 NOTE — ED Triage Notes (Signed)
Pt arrives via [facility] with reports of cp. Pt reports onset appro 0900, took zantac without relief. Pt given 50mg  amiodarone en route for runs of VTACH. Pt hypertensive with EMS. Pt arrives with IO in place. 40mg  Lidocane given by EMS. 324mg  aspirin given. No nitro given PTA. Hx 4 MI. 190/102, NRB at 98%, A&OX4.

## 2016-12-16 NOTE — H&P (Signed)
Cardiology Admission History and Physical:   Patient ID: Luis Donaldson; MRN: [id number]; DOB: 19-Oct-1945   Admission date: 12/16/2016  Primary Care Provider: Benito Mccreedy, MD Primary Cardiologist: No primary care provider on file. New Dr. Martinique Primary Electrophysiologist:  NA  Chief Complaint:  Chest pain   Patient Profile:   Luis Donaldson is a 71 y.o. male with a history of CABG in 2000, + tobacco doesn't take meds and developed chest pain today around 9 AM.  + STEMI   History of Present Illness:   Luis Donaldson hx of CABG in 2000 and not much follow up due to non compliance.  Today he developed chest pain around 9 AM and pain would come and go.  His neighbor came home and then called 911. He had tried zantac without relief.  EMS found [facility] also hypertensive, for continued bursts of Vtach he was also given lidocaine.  ASA given 324 mg.  BP was 190/102 .  With acute SOB lasix 40 mg given. Pt had to sit straight up to breathe.    Pt then intubated and CXR with ETT and NG tube in grossly good position.  Mild cardiomegaly with central pulmonary vascular congestion - possible bilateral perihilar edema.  Brought emergently to cath lab.  We explained need for intubation, and cardiac cath.  He was agreeable.  Pt has no family and he asked [place] to give information to his neighbor who is here with him.      Luis Donaldson  EKG SR with big couplets, ST elevation in ant leads. Second EKG with runs of NSVT.  I personally reviewed.  Pt to cath lab on amio drip.    Past Medical History:  Diagnosis Date  . Coronary artery disease   . Hyperlipidemia   . Hypertension   . MI (myocardial infarction) [facility])     Past Surgical History:  Procedure Laterality Date  . CORONARY ARTERY BYPASS GRAFT       Medications Prior to Admission: Prior to Admission medications   [name]   NONE  Allergies:   No Known Allergies  Social History:   Social History   Socioeconomic History  . Marital status:  Divorced    Spouse name: [name]  . Number of children: [name]  . Years of education: [name]  . Highest education level: [name]  Social Needs  . Financial resource strain: [name]  . Food insecurity - worry: [name]  . Food insecurity - inability: [name]  . Transportation needs - medical: [name]  . Transportation needs - non-medical: [name]  Occupational History  . [name]  Tobacco Use  . Smoking status: Current Every Day Smoker    Types: Cigarettes  . Smokeless tobacco: Never Used  Substance and Sexual Activity  . Alcohol use: [name]  . Drug use: [name]  . Sexual activity: [name]  Other Topics Concern  . [name]  Social History Narrative  . [name]    Family History:   The patient's family history includes Heart disease in his mother.    ROS:  Please see the history of present illness.  General:no colds or fevers, no weight changes Skin:no rashes or ulcers HEENT:no blurred vision, no congestion CV:see HPI PUL:see HPI GI:no diarrhea constipation or melena, no indigestion GU:no hematuria, no dysuria MS:no joint pain, no claudication Neuro:no syncope,  no lightheadedness Endo:no diabetes, no thyroid disease   Physical Exam/Data:   Vitals:   12/16/16 1744 12/16/16 1745 12/16/16 1758 12/16/16 1810  BP:  (!) 193/93    Pulse: (!) 40 (!) 42  (!) 103  Resp: (!) 32 (!) 28  13  Temp:      TempSrc:      SpO2: 94% 95% 98% 92%  Weight:      Height:    5\' 9"  (1.753 m)   No intake or output data in the 24 hours ending 12/16/16 1849 Filed Weights   12/16/16 1722  Weight: 210 lb (95.3 kg)   Body mass index is 31.01 kg/m.  General:  Well nourished, well developed, in acute distress with SOB and runs of NSVT HEENT: normal Lymph: no adenopathy Neck: no JVD sitting up right Endocrine:  No thryomegaly Vascular: No carotid bruits;difficult to feel pedal pulses Cardiac:  normal S1, S2; RRR; no murmur rub  or clicks but heart sounds muffled Lungs:  Bilateral breath sounds to auscultation bilaterally, no wheezing, rhonchi + rales  Abd: obese,soft, nontender, no hepatomegaly  Ext: no to trace lower ext edema Musculoskeletal:  No deformities, BUE and BLE strength normal and equal Skin: warm and dry to moist Neuro:  Alert and oriented X 3 MAE, follows commands aking appropriate questions, no focal abnormalities noted Psych:  Normal affect      Relevant CV Studies: None noted in Epic  Laboratory Data:  Chemistry Recent Labs  Lab 12/16/16 1734  NA 135  K 4.3  CL 101  CO2 19*  GLUCOSE 219*  BUN 9  CREATININE 1.43*  CALCIUM 9.1  GFRNONAA 48*  GFRAA 55*  ANIONGAP 15    Recent Labs  Lab 12/16/16 1734  PROT 7.6  ALBUMIN 4.4  AST 31  ALT 33  ALKPHOS 126  BILITOT 1.2   Hematology Recent Labs  Lab 12/16/16 1734  WBC 11.6*  RBC 5.75  HGB 18.4*  HCT 55.1*  MCV 95.8  MCH 32.0  MCHC 33.4  RDW 13.5  PLT 153   Cardiac Enzymes Recent Labs  Lab 12/16/16 1734  TROPONINI 0.05*   No results for input(s): TROPIPOC in the last 168 hours.  BNPNo results for input(s): BNP, PROBNP in the last 168 hours.  DDimer No results for input(s): DDIMER in the last 168 hours.  Radiology/Studies:  Dg Chest Port 1 View  Result Date: 12/16/2016 CLINICAL DATA:  Endotracheal tube placement. EXAM: PORTABLE CHEST 1 VIEW COMPARISON:  None. FINDINGS: Mild cardiomegaly is noted. Endotracheal tube is seen projected over tracheal air shadow with distal tip 7 cm above the carina. Nasogastric tube is seen entering the stomach. No pneumothorax or pleural effusion is noted. Mild central pulmonary vascular congestion and possible perihilar edema is noted. Bony thorax is unremarkable. IMPRESSION: Endotracheal and nasogastric tubes in grossly good position. Mild cardiomegaly with central pulmonary vascular congestion is noted. Possible bilateral perihilar edema. Electronically Signed   By: Marijo Conception,  M.D.   On: 12/16/2016 18:24    Assessment and Plan:   1. STEMI ant wall.  Emergently to cath lab.  Then serial troponins post cath orders, high dose statin, BB depending on BP per Dr. Martinique.    2.    Acute Respiratory failure due th heart failure and ischemia.  Intubated and CCM will follow Vent  3.     CAD with hx CABG in 2000 and not much follow up due to non compliance.   4.  HLD begin lipitor 80 check labs.   5.      CKD -3 monitor  6.      Tobacco use, brief discussion to stop. Prior to intubation  7.       NSVT on amiodarone and bolus of lidocaine.  Secondary to ischemia  Severity of Illness: The appropriate patient status for this patient is INPATIENT. Inpatient status is judged to be reasonable and necessary in order to provide the required intensity of service to ensure the patient's safety. The patient's presenting symptoms, physical exam findings, and initial radiographic and laboratory data in the context of their chronic comorbidities is felt to place them at high risk for further clinical deterioration. Furthermore, it is not anticipated that the patient will be medically stable for discharge from the hospital within 2 midnights of admission. The following factors support the patient status of inpatient.   " The patient's presenting symptoms include STEMI, chest pain, Respiratory failure and VTACH. " The worrisome physical exam findings include Heart failure severe SOB. " The initial radiographic and laboratory data are worrisome because of acute MI. " The chronic co-morbidities include CAD, tobacco use, and non compliance.   * I certify that at the point of admission it is my clinical judgment that the patient will require inpatient hospital care spanning beyond 2 midnights from the point of admission due to high intensity of service, high risk for further deterioration and high frequency of surveillance required.*    For questions or updates, please contact [place] Please consult [email or web address] for contact info under Cardiology/STEMI.    Signed, Cecilie Kicks, NP  12/16/2016 6:49 PM &p

## 2016-12-16 NOTE — Progress Notes (Signed)
eLink Physician-Brief Progress Note Patient Name: Luis Donaldson DOB: August 28, 1945 MRN: [id number]   Date of Service  12/16/2016  HPI/Events of Note  Acute respiratory failure. ABG post intubation reviewed.   eICU Interventions  1. Continuing to keep at 8 2. Weaning FiO2 for saturation greater than 94% 3. Continuing on diuresis as ordered by cardiology      Intervention Category Major Interventions: Respiratory failure - evaluation and management  Tera Partridge 12/16/2016, 9:28 PM

## 2016-12-16 NOTE — ED Notes (Signed)
Amiodarone restarted at [facility]

## 2016-12-16 NOTE — Plan of Care (Signed)
  Respiratory: Ability to maintain a clear airway and adequate ventilation will improve 12/16/2016 2356 - Progressing by Reinaldo Berber, RN

## 2016-12-17 ENCOUNTER — Inpatient Hospital Stay (HOSPITAL_COMMUNITY): Payer: Medicare Other

## 2016-12-17 ENCOUNTER — Encounter (HOSPITAL_COMMUNITY): Payer: Self-pay | Admitting: Cardiology

## 2016-12-17 DIAGNOSIS — I340 Nonrheumatic mitral (valve) insufficiency: Secondary | ICD-10-CM

## 2016-12-17 DIAGNOSIS — Z951 Presence of aortocoronary bypass graft: Secondary | ICD-10-CM

## 2016-12-17 DIAGNOSIS — I214 Non-ST elevation (NSTEMI) myocardial infarction: Secondary | ICD-10-CM

## 2016-12-17 DIAGNOSIS — I25110 Atherosclerotic heart disease of native coronary artery with unstable angina pectoris: Secondary | ICD-10-CM

## 2016-12-17 DIAGNOSIS — I161 Hypertensive emergency: Secondary | ICD-10-CM

## 2016-12-17 LAB — CBC WITH DIFFERENTIAL/PLATELET
BASOS ABS: 0 10*3/uL (ref 0.0–0.1)
Basophils Relative: 0 %
Eosinophils Absolute: 0.1 10*3/uL (ref 0.0–0.7)
Eosinophils Relative: 1 %
HCT: 47.1 % (ref 39.0–52.0)
HEMOGLOBIN: 15.9 g/dL (ref 13.0–17.0)
LYMPHS ABS: 2.1 10*3/uL (ref 0.7–4.0)
LYMPHS PCT: 24 %
MCH: 31.9 pg (ref 26.0–34.0)
MCHC: 33.8 g/dL (ref 30.0–36.0)
MCV: 94.6 fL (ref 78.0–100.0)
Monocytes Absolute: 1 10*3/uL (ref 0.1–1.0)
Monocytes Relative: 11 %
NEUTROS ABS: 5.6 10*3/uL (ref 1.7–7.7)
NEUTROS PCT: 64 %
Platelets: 131 10*3/uL — ABNORMAL LOW (ref 150–400)
RBC: 4.98 MIL/uL (ref 4.22–5.81)
RDW: 13.8 % (ref 11.5–15.5)
WBC: 8.9 10*3/uL (ref 4.0–10.5)

## 2016-12-17 LAB — GLUCOSE, CAPILLARY
Glucose-Capillary: 89 mg/dL (ref 65–99)
Glucose-Capillary: 115 mg/dL — ABNORMAL HIGH (ref 65–99)

## 2016-12-17 LAB — BRAIN NATRIURETIC PEPTIDE: B NATRIURETIC PEPTIDE 5: 605.8 pg/mL — AB (ref 0.0–100.0)

## 2016-12-17 LAB — LIPID PANEL
CHOLESTEROL: 202 mg/dL — AB (ref 0–200)
HDL: 31 mg/dL — ABNORMAL LOW (ref >40)
LDL CALC: 125 mg/dL — AB (ref 0–99)
TRIGLYCERIDES: 231 mg/dL — AB (ref <150)
Total CHOL/HDL Ratio: 6.5 RATIO
VLDL: 46 mg/dL — AB (ref 0–40)

## 2016-12-17 LAB — ECHOCARDIOGRAM COMPLETE
HEIGHTINCHES: 69 in
Weight: 3216.95 oz

## 2016-12-17 LAB — RENAL FUNCTION PANEL
Albumin: 3.5 g/dL (ref 3.5–5.0)
Anion gap: 11 (ref 5–15)
BUN: 11 mg/dL (ref 6–20)
CALCIUM: 8.5 mg/dL — AB (ref 8.9–10.3)
CHLORIDE: 100 mmol/L — AB (ref 101–111)
CO2: 26 mmol/L (ref 22–32)
CREATININE: 1.42 mg/dL — AB (ref 0.61–1.24)
GFR, EST AFRICAN AMERICAN: 56 mL/min — AB (ref >60)
GFR, EST NON AFRICAN AMERICAN: 48 mL/min — AB (ref >60)
Glucose, Bld: 116 mg/dL — ABNORMAL HIGH (ref 65–99)
Phosphorus: 4.4 mg/dL (ref 2.5–4.6)
Potassium: 4.1 mmol/L (ref 3.5–5.1)
SODIUM: 137 mmol/L (ref 135–145)

## 2016-12-17 LAB — HEMOGLOBIN A1C
HEMOGLOBIN A1C: 7 % — AB (ref 4.8–5.6)
Mean Plasma Glucose: 154.2 mg/dL

## 2016-12-17 LAB — PHOSPHORUS
PHOSPHORUS: 4 mg/dL (ref 2.5–4.6)
Phosphorus: 3.5 mg/dL (ref 2.5–4.6)

## 2016-12-17 LAB — MAGNESIUM
MAGNESIUM: 2.3 mg/dL (ref 1.7–2.4)
MAGNESIUM: 2.1 mg/dL (ref 1.7–2.4)

## 2016-12-17 LAB — BASIC METABOLIC PANEL
ANION GAP: 12 (ref 5–15)
BUN: 12 mg/dL (ref 6–20)
CALCIUM: 8.8 mg/dL — AB (ref 8.9–10.3)
CO2: 21 mmol/L — AB (ref 22–32)
CREATININE: 1.28 mg/dL — AB (ref 0.61–1.24)
Chloride: 103 mmol/L (ref 101–111)
GFR calc Af Amer: >60 mL/min (ref >60)
GFR, EST NON AFRICAN AMERICAN: 55 mL/min — AB (ref >60)
Glucose, Bld: 151 mg/dL — ABNORMAL HIGH (ref 65–99)
Potassium: 4 mmol/L (ref 3.5–5.1)
Sodium: 136 mmol/L (ref 135–145)

## 2016-12-17 LAB — HEPARIN LEVEL (UNFRACTIONATED)
HEPARIN UNFRACTIONATED: 0.29 [IU]/mL — AB (ref 0.30–0.70)
HEPARIN UNFRACTIONATED: 0.25 [IU]/mL — AB (ref 0.30–0.70)

## 2016-12-17 LAB — TROPONIN I
Troponin I: 4.99 ng/mL (ref <0.03)
Troponin I: 11.89 ng/mL (ref <0.03)
Troponin I: 9.94 ng/mL (ref <0.03)

## 2016-12-17 LAB — T4, FREE: Free T4: 1 ng/dL (ref 0.61–1.12)

## 2016-12-17 LAB — MRSA PCR SCREENING: MRSA BY PCR: NEGATIVE

## 2016-12-17 LAB — TSH: TSH: 1.036 u[IU]/mL (ref 0.350–4.500)

## 2016-12-17 MED ORDER — PERFLUTREN LIPID MICROSPHERE
INTRAVENOUS | Status: AC
  Filled 2016-12-17: qty 10

## 2016-12-17 MED ORDER — PERFLUTREN LIPID MICROSPHERE
1.0000 mL | INTRAVENOUS | Status: AC | PRN
  Administered 2016-12-17: 2 mL via INTRAVENOUS
  Filled 2016-12-17: qty 10

## 2016-12-17 MED ORDER — LISINOPRIL 5 MG PO TABS
5.0000 mg | ORAL_TABLET | Freq: Every day | ORAL | Status: DC
  Administered 2016-12-17 – 2016-12-19 (×3): 5 mg via ORAL
  Filled 2016-12-17 (×3): qty 1

## 2016-12-17 MED ORDER — ATROPINE SULFATE 1 MG/10ML IJ SOSY
PREFILLED_SYRINGE | INTRAMUSCULAR | Status: AC
  Filled 2016-12-17: qty 10

## 2016-12-17 MED FILL — Verapamil HCl IV Soln 2.5 MG/ML: INTRAVENOUS | Qty: 2 | Status: AC

## 2016-12-17 MED FILL — Nitroglycerin IV Soln 100 MCG/ML in D5W: INTRA_ARTERIAL | Qty: 10 | Status: AC

## 2016-12-17 NOTE — Consult Note (Signed)
PULMONARY / CRITICAL CARE MEDICINE   Name: Luis Donaldson MRN: [id number] DOB: 05/14/1945    ADMISSION DATE:  12/16/2016 CONSULTATION DATE:  12/16/2016  REFERRING MD:  Dr. Martinique  CHIEF COMPLAINT:  STEMI  HISTORY OF PRESENT ILLNESS:  HPI obtained from medical chart review as patient is intubated and sedated.   71 year old male with past medical history of tobacco abuse, CAD, prior MI, and CABG in 2000 and non-compliance with poor medical follow up who presented 11/26 with chest pain.  Patient reported onset of chest pain around 0900 today, intermittent, unrelieved with zantac at home.  His neighbor called 911 this evening, found by EMS in [place] and hypertensive 190/102 with respiratory distress. Treated with lidocaine, aspirin, and lasix for pulmonary edema.    In ER, EKG noted ST elevation in anterior leads and ongoing NSVT.  Placed on amiodarone gtt.  Intubated in the ER and taken emergently for cardiac catheterization.  PCCM consulted for ventilator management.    SUBJECTIVE:  Currently on amiodarone gtt at 30 mg/hr, in bigeminy with last VT run of 7 beats 11/27  am. Awake and alert, and writing notes, communicating well and following commands. Propofol off for vent weaning, NTG off through night.   VITAL SIGNS: BP 115/67   Pulse 64   Temp 98.5 F (36.9 C) (Oral)   Resp 14   Ht 5\' 9"  (1.753 m)   Wt 201 lb 1 oz (91.2 kg)   SpO2 100%   BMI 29.69 kg/m   HEMODYNAMICS:    VENTILATOR SETTINGS: Vent Mode: CPAP;PSV FiO2 (%):  [40 %-100 %] 40 % Set Rate:  [12 bmp-14 bmp] 12 bmp Vt Set:  [570 mL-580 mL] 580 mL PEEP:  [5 cmH20-8 cmH20] 5 cmH20 Pressure Support:  [10 cmH20] 10 cmH20 Plateau Pressure:  [21 cmH20-25 cmH20] 21 cmH20  INTAKE / OUTPUT: I/O last 3 completed shifts: In: 745 [I.V.:745] Out: 2075 [Urine:2075]  PHYSICAL EXAMINATION: General:  Well nourished male awake and alert, weaning  40%, 5 PEEP 10 PS, in NAD HEENT: MM pink/moist, pupils 3/=/reactive, ETT at 23  cm at teeth, OG, NCAT Neuro: Awake and alert, following commands, writing notes, , MAE x 4 CV: ir rrr, runs with bigeminy after,  distant heart sounds, right groin site dressing CDI, + dp pulses PULM: even/non-labored on PS , lungs with  bilateral rhonchi, no wheezes, few rales per bases GI: protuberant, soft, non-tender,  bs active  Extremities: cool/dry, no peripheral edema, left tibial IO Skin: no rashes, lesions or tears  LABS:  BMET Recent Labs  Lab 12/16/16 1734 12/16/16 1848  NA 135 137  K 4.3 4.5  CL 101 103  CO2 19*  --   BUN 9 13  CREATININE 1.43* 1.20  GLUCOSE 219* 314*   Electrolytes Recent Labs  Lab 12/16/16 1734 12/16/16 2302 12/17/16 0629  CALCIUM 9.1  --   --   MG 2.1 2.3 2.1   CBC Recent Labs  Lab 12/16/16 1734 12/16/16 1848 12/17/16 0629  WBC 11.6*  --  8.9  HGB 18.4* 17.3* 15.9  HCT 55.1* 51.0 47.1  PLT 153  --  131*   Coag's Recent Labs  Lab 12/16/16 1734  APTT 28  INR 1.02   Sepsis Markers No results for input(s): LATICACIDVEN, PROCALCITON, O2SATVEN in the last 168 hours.  ABG Recent Labs  Lab 12/16/16 2119  PHART 7.342*  PCO2ART 42.9  PO2ART 179.0*   Liver Enzymes Recent Labs  Lab 12/16/16 1734  AST  31  ALT 33  ALKPHOS 126  BILITOT 1.2  ALBUMIN 4.4   Cardiac Enzymes Recent Labs  Lab 12/16/16 1734 12/16/16 2302 12/17/16 0629  TROPONINI 0.05* 4.99* 11.89*   Glucose Recent Labs  Lab 12/16/16 2058 12/16/16 2323 12/17/16 0356 12/17/16 0717  GLUCAP 219* 150* 89 115*   Imaging Dg Chest Port 1 View  Result Date: 12/16/2016 CLINICAL DATA:  Endotracheal tube placement. EXAM: PORTABLE CHEST 1 VIEW COMPARISON:  None. FINDINGS: Mild cardiomegaly is noted. Endotracheal tube is seen projected over tracheal air shadow with distal tip 7 cm above the carina. Nasogastric tube is seen entering the stomach. No pneumothorax or pleural effusion is noted. Mild central pulmonary vascular congestion and possible perihilar edema  is noted. Bony thorax is unremarkable. IMPRESSION: Endotracheal and nasogastric tubes in grossly good position. Mild cardiomegaly with central pulmonary vascular congestion is noted. Possible bilateral perihilar edema. Electronically Signed   By: Marijo Conception, M.D.   On: 12/16/2016 18:24   STUDIES:  12/16/2016 LHC>> 1.  Severe 3 vessel occlusive CAD 2.  Patent LIMA to the LAD 3.  Patent free radial graft to the OM1 4.  Occluded SVG to the RCA. This appears chronic.  5.  The RCA and first diagonal are filled by collaterals 6.  Severe LV dysfunction. EF estimated at 25-30% 7.  Markedly elevated LVEDP.  CULTURES: MRSA PCR 12/16/2016 >> Negative  ANTIBIOTICS: Not clinically indicated  SIGNIFICANT EVENTS: 12/16/2016 Admit STEMI, intubated, LHC  LINES/TUBES: PIV x 3 Left tibial IO  ETT 11/26 >> OGT 11/26 >>   DISCUSSION: 77 yoM w/PMH of CAD and prior CABG in 2000 with poor medical compliance presented with onset of CP today found to have anterior STEMI, NSVT, and hypertensive with acute pulmonary edema intubated and taken for emergent cardiac cath.   ASSESSMENT / PLAN:  PULMONARY A: Acute hypoxic respiratory failure secondary to acute pulmonary edema in the setting of STEMI/ hypertensive crisis Tobacco Abuse  - CXR 11/27: Cardiomegaly with normal pulmonary vascularity. Interim improvement of interstitial prominence suggesting improving CHF. No prominent pleural effusion Weaning well this am Plan: Weaning well >> FiO2/ peep for spo2 > 92 Extubate to [place] with goal of sats> 94% ABG prn Trend CXR daily Lasix per cards VAP protocol duonebs prn SBT when stable/ appropiate Tobacco abuse counseling when appropriate  Qualifies for Lung Cancer Screening  CARDIOVASCULAR A:  Anterior STEMI NSVT Hypertensive Crisis Hx CAD, prior MI, CABG in 2000 - cholesterol 232, LDL 176, HDL 35 - LHC diagnostic only; no culprit lesion found, good collaterals, EF 25-30% - Continued short  runs with bigeminal rhythm - Cath 11/26>> old grafts patent, good  collateral circulation>> plan to manage medically - Troponin continue to rise P:  Cardiology Primary>> plan to treat medically Tele monitoring Trend troponin/ EKG BNP pending Amio/ NTG/ heparin gtt per Cards Follow electrolytes  ASA daily  RENAL A:   AKI- unknown baseline sCr P:   S/p lasix 40 mg x 1, and then q 12 hr per Cards Monitor for urinary retention Trend BMP /mag/ phos/ urinary output/ daily weights Replace electrolytes as indicated Avoid nephrotoxic medications Maintain renal perfusion  GASTROINTESTINAL A:   NPO - LFTs wnl P:   Will need swallow eval 11/27 PPI for SUP Bowel regimen with PAD protocol  HEMATOLOGIC A:   Slight leukocytosis Elevated H/H, normal plts Heparin gtt P:  Trend CBC  Monitor for bleeding Systemic heparin as above and SCDs for VTE ppx  INFECTIOUS A:  Leukocytosis- mild, likely reactive>> resolved P:   Monitor fever curve/ WBC Culture if clinically indicated   ENDOCRINE A:   Hyperglycemia P:   CBG q 4 SSI Assess HgbA1c Assess TSH  NEUROLOGIC A:   Acute encephalopathy- related to sedation>> resolved Awake and alert, following commands P:   RASS goal: -1 Discontinue PAD protocol with propofol and prn fentanyl Daily wake up assessment if stable  FAMILY  - Updates: Neighbor of 20+ years at bedside, Darrold Span [phone], reports patient not feeling well for probably a month.  Reports patient previously divorced with no known children.  Does not know of any family or emergency contacts.  11/27>> No family or friends at bedside. Patient updated.    - Inter-disciplinary family meet or Palliative Care meeting due by: 12/23/2016  Magdalen Spatz, , AGACNP-BC [place] [facility] Pgr: [phone] 12/17/2016, 8:37 AM  Attending Note:  71 year old s/p STEMI that was intubated in the cath lab due to pulmonary edema induced respiratory  failure and PCCM was consulted for vent management.  Patient is off sedation this AM and is alert and interactive with bibasilar crackles.  Weaning well.  I reviewed CXR myself, ETT is in good position and pulmonary edema noted.  Discussed with PCCM-NP and RT.  Will proceed with extubation.  D/C NTG as BP is improved.  Diureses as ordered.  No need for SLP.  Ambulate.  IS and flutter valve.  Strict I/O.  PT/OT evaluation.  PCCM will follow.  The patient is critically ill with multiple organ systems failure and requires high complexity decision making for assessment and support, frequent evaluation and titration of therapies, application of advanced monitoring technologies and extensive interpretation of multiple databases.   Critical Care Time devoted to patient care services described in this note is  35  Minutes. This time reflects time of care of this signee Dr Jennet Maduro. This critical care time does not reflect procedure time, or teaching time or supervisory time of PA/NP/Med student/Med Resident etc but could involve care discussion time.  Rush Farmer, M.D. [facility] Pulmonary/Critical Care Medicine. Pager: [phone]. After hours pager: [phone].

## 2016-12-17 NOTE — Progress Notes (Signed)
ANTICOAGULATION CONSULT NOTE - Follow-Up Consult  Pharmacy Consult for heparin  Indication: chest pain/ACS  No Known Allergies  Patient Measurements: Height: _0  (175.3 cm) Weight: 201 lb 1 oz (91.2 kg) IBW/kg (Calculated) : 70.7 Heparin Dosing Weight: 90kg  Vital Signs: Temp: 98.5 F (36.9 C) (11/27 0713) Temp Source: Oral (11/27 0713) BP: 115/67 (11/27 0715) Pulse Rate: 64 (11/27 0715)  Labs: Recent Labs    12/16/16 1734 12/16/16 1848 12/16/16 2302 12/17/16 0629 12/17/16 0916  HGB 18.4* 17.3*  --  15.9  --   HCT 55.1* 51.0  --  47.1  --   PLT 153  --   --  131*  --   APTT 28  --   --   --   --   LABPROT 13.3  --   --   --   --   INR 1.02  --   --   --   --   HEPARINUNFRC  --   --   --   --  0.29*  CREATININE 1.43* 1.20  --  1.42*  --   TROPONINI 0.05*  --  4.99* 11.89*  --     Estimated Creatinine Clearance: 53.2 mL/min (A) (by C-G formula based on SCr of 1.42 mg/dL (H)).   Medical History: Past Medical History:  Diagnosis Date  . Coronary artery disease   . Hyperlipidemia   . Hypertension   . MI (myocardial infarction) ([place])     Medications:  No medications prior to admission.   Scheduled:  . aspirin  324 mg Oral Once  . aspirin EC  81 mg Oral Daily  . atorvastatin  80 mg Oral q1800  . atropine      . chlorhexidine gluconate (MEDLINE KIT)  15 mL Mouth Rinse BID  . furosemide  40 mg Intravenous Q12H  . insulin aspart  1-3 Units Subcutaneous Q4H  . mouth rinse  15 mL Mouth Rinse QID  . pantoprazole (PROTONIX) IV  40 mg Intravenous Daily  . sodium chloride flush  3 mL Intravenous Q12H    Assessment: 71 yo male s/p STEMI and cath. Heparin restarted 8 hours after sheath removal (removed ~ 7:30pm). Heparin level subtherapeutic at 0.29 on 1250 units/hr with no bolus. CBC low/stable.   Goal of Therapy:  Heparin level 0.3-0.7 units/ml Monitor platelets by anticoagulation protocol: Yes   Plan:  -Increase heparin to 1350 units/hr -Recheck 8 hour  level -Daily heparin level and CBC  Angus Seller, PharmD Pharmacy Resident [phone] 12/17/2016 10:54 AM

## 2016-12-17 NOTE — Progress Notes (Signed)
Inpatient Diabetes Program Recommendations  AACE/ADA: New Consensus Statement on Inpatient Glycemic Control (2015)  Target Ranges:  Prepandial:   less than 140 mg/dL      Peak postprandial:   less than 180 mg/dL (1-2 hours)      Critically ill patients:  140 - 180 mg/dL  Results for Luis Donaldson, Luis Donaldson (MRN [id number]) as of 12/17/2016 07:32  Ref. Range 12/16/2016 20:58 12/16/2016 23:23 12/17/2016 03:56 12/17/2016 07:17  Glucose-Capillary Latest Ref Range: 65 - 99 mg/dL 219 (H) 150 (H) 89 115 (H)   Results for Luis Donaldson, Luis Donaldson (MRN [id number]) as of 12/17/2016 07:32  Ref. Range 12/17/2016 05:42  Hemoglobin A1C Latest Ref Range: 4.8 - 5.6 % 7.0 (H)  Results for Luis Donaldson, Luis Donaldson (MRN [id number]) as of 12/17/2016 07:32  Ref. Range 12/16/2016 17:34 12/16/2016 18:48  Glucose Latest Ref Range: 65 - 99 mg/dL 219 (H) 314 (H)   Review of Glycemic Control  Diabetes history: No Outpatient Diabetes medications: NA Current orders for Inpatient glycemic control: Novolog 1-3 units Q4H (ICU Glycemic control order set; Phase 1)  Inpatient Diabetes Program Recommendations: HgbA1C: A1C 7.0% on 12/17/16. Per ADA, if A1C is 6.5% or greater criteria met to dx with DM. MD, please indicate in note if patient will be newly admitted with DM.  If so, when appropriate, please inform patient and nursing staff so patient can be educated.  Thanks, Barnie Alderman, RN, MSN, CDE Diabetes Coordinator Inpatient Diabetes Program [phone] (Team Pager from 8am to 5pm)

## 2016-12-17 NOTE — Procedures (Signed)
Extubation Procedure Note  Patient Details:   Name: CYREE CHUONG DOB: 01/29/1945 MRN: [id number]   Airway Documentation:     Evaluation  O2 sats: stable throughout Complications: No apparent complications Patient did tolerate procedure well. Bilateral Breath Sounds: Clear, Diminished   Yes  PT was extubated to 3L [place]  PT was able to clear secretions, was able talk. Sats are stable and RT at bedside to monitor   Tereso Unangst, Leonie Douglas 12/17/2016, 9:20 AM

## 2016-12-17 NOTE — Plan of Care (Signed)
Patient currently intubated, so no education attempted.

## 2016-12-17 NOTE — Progress Notes (Signed)
CRITICAL VALUE ALERT  Critical Value:  Trop 4.99  Date & Time Notied:  11/27 0029  Provider Notified: no, value expected  Orders Received/Actions taken: n/a

## 2016-12-17 NOTE — Progress Notes (Signed)
  Echocardiogram 2D Echocardiogram has been performed.  Bobbye Charleston 12/17/2016, 11:40 AM

## 2016-12-17 NOTE — Progress Notes (Signed)
Progress Note  Patient Name: Luis Donaldson Date of Encounter: 12/17/2016  Primary Cardiologist: new, [place]  Subjective   Patients states that over the last year he has had worsening burning chest pain that was exacerbated by exertion and associated with worsening shortness of breath. He states that most times he has had associated palpitations as well. He has had a chronic productive cough during this same period. He reports getting extremely dyspneic even getting his mail. He denies episodes of syncope.   He reports last seeing a cardiologist about years ago, somewhere in [place]. He stopped taking medications years ago by choice. Patient lives by himself and is uninsured. He states that total cost of medication would have to be about $10 per month for him to afford.  Currently, patient endorses occasional milder, burning chest pain. He continued to have a mild productive cough (pink, frothy sputum noted).   Patient was unaware that being intubated was a serious incidence. He perseverates on his tibial  IO which had to be placed in the ambulance due to inability to get IV access.  Inpatient Medications    Scheduled Meds: . aspirin  324 mg Oral Once  . aspirin EC  81 mg Oral Daily  . atorvastatin  80 mg Oral q1800  . atropine      . chlorhexidine gluconate (MEDLINE KIT)  15 mL Mouth Rinse BID  . furosemide  40 mg Intravenous Q12H  . insulin aspart  1-3 Units Subcutaneous Q4H  . mouth rinse  15 mL Mouth Rinse QID  . pantoprazole (PROTONIX) IV  40 mg Intravenous Daily  . sodium chloride flush  3 mL Intravenous Q12H   Continuous Infusions: . sodium chloride 10 mL/hr at 12/16/16 2109  . sodium chloride    . amiodarone 30 mg/hr (12/17/16 0800)  . heparin 1,250 Units/hr (12/17/16 0800)  . nitroGLYCERIN Stopped (12/16/16 2058)   PRN Meds: sodium chloride, acetaminophen, bisacodyl, docusate, ipratropium-albuterol, nitroGLYCERIN, ondansetron (ZOFRAN) IV, sodium chloride flush    Vital Signs    Vitals:   12/17/16 0509 12/17/16 0600 12/17/16 0713 12/17/16 0715  BP: (!) 159/73 121/77 131/72 115/67  Pulse: 65 (!) 59 61 64  Resp: '19 14 18 14  '$ Temp:   98.5 F (36.9 C)   TempSrc:   Oral   SpO2: 100% 100% 100% 100%  Weight:      Height:        Intake/Output Summary (Last 24 hours) at 12/17/2016 1038 Last data filed at 12/17/2016 0800 Gross per 24 hour  Intake 774.15 ml  Output 2075 ml  Net -1300.85 ml   Filed Weights   12/16/16 1722 12/16/16 2015 12/17/16 0452  Weight: 210 lb (95.3 kg) 203 lb 4.2 oz (92.2 kg) 201 lb 1 oz (91.2 kg)    Telemetry    SR, bigeminy, frequent PVC, some runs of NSVT - Personally Reviewed, 12/17/2016  ECG    SR,1st degree AV block, PVC, ST elevation in V2 only - Personally Reviewed, 12/17/2016  Physical Exam   GEN: No acute distress.   Neck: No JVD Cardiac: RRR, no murmurs, rubs, or gallops.  Respiratory: Coarse breath sounds bilaterally. GI: Soft, nontender, non-distended  MS: No edema; left tibial IO site clean, nontender. Neuro:  Nonfocal  Psych: Normal affect   Labs    Chemistry Recent Labs  Lab 12/16/16 1734 12/16/16 1848 12/17/16 0629  NA 135 137 137  K 4.3 4.5 4.1  CL 101 103 100*  CO2 19*  --  26  GLUCOSE 219* 314* 116*  BUN '9 13 11  '$ CREATININE 1.43* 1.20 1.42*  CALCIUM 9.1  --  8.5*  PROT 7.6  --   --   ALBUMIN 4.4  --  3.5  AST 31  --   --   ALT 33  --   --   ALKPHOS 126  --   --   BILITOT 1.2  --   --   GFRNONAA 48*  --  48*  GFRAA 55*  --  56*  ANIONGAP 15  --  11     Hematology Recent Labs  Lab 12/16/16 1734 12/16/16 1848 12/17/16 0629  WBC 11.6*  --  8.9  RBC 5.75  --  4.98  HGB 18.4* 17.3* 15.9  HCT 55.1* 51.0 47.1  MCV 95.8  --  94.6  MCH 32.0  --  31.9  MCHC 33.4  --  33.8  RDW 13.5  --  13.8  PLT 153  --  131*    Cardiac Enzymes Recent Labs  Lab 12/16/16 1734 12/16/16 2302 12/17/16 0629  TROPONINI 0.05* 4.99* 11.89*   No results for input(s): TROPIPOC in  the last 168 hours.   BNP Recent Labs  Lab 12/16/16 2302  BNP 605.8*     DDimer No results for input(s): DDIMER in the last 168 hours.   Radiology    Portable Chest Xray  Result Date: 12/17/2016 CLINICAL DATA:  Intubation. EXAM: PORTABLE CHEST 1 VIEW COMPARISON:  12/16/2016. FINDINGS: Endotracheal tube and NG tube in stable position. Prior median sternotomy. Cardiomegaly with normal pulmonary vascularity. Interim improvement of interstitial prominence suggesting improving CHF. No prominent pleural effusion. Costophrenic angles incompletely imaged. No pneumothorax identified. IMPRESSION: 1.  Endotracheal tube and NG tube in stable position. 2. Prior CABG. Stable cardiomegaly. Interim improvement of interstitial prominence suggesting improving CHF. Electronically Signed   By: Marcello Moores  Register   On: 12/17/2016 08:42   Dg Chest Port 1 View  Result Date: 12/16/2016 CLINICAL DATA:  Endotracheal tube placement. EXAM: PORTABLE CHEST 1 VIEW COMPARISON:  None. FINDINGS: Mild cardiomegaly is noted. Endotracheal tube is seen projected over tracheal air shadow with distal tip 7 cm above the carina. Nasogastric tube is seen entering the stomach. No pneumothorax or pleural effusion is noted. Mild central pulmonary vascular congestion and possible perihilar edema is noted. Bony thorax is unremarkable. IMPRESSION: Endotracheal and nasogastric tubes in grossly good position. Mild cardiomegaly with central pulmonary vascular congestion is noted. Possible bilateral perihilar edema. Electronically Signed   By: Marijo Conception, M.D.   On: 12/16/2016 18:24    Cardiac Studies   LHC 12/16/2016: 1.  Severe 3 vessel occlusive CAD 2.  Patent LIMA to the LAD 3.  Patent free radial graft to the OM1 4.  Occluded SVG to the RCA. This appears chronic.  5.  The RCA and first diagonal are filled by collaterals 6.  Severe LV dysfunction. EF estimated at 25-30% 7.  Markedly elevated LVEDP. Plan: I am unable to identify  any culprit lesion. The LAD and LCx territories are well revascularized. The SVG to the RCA is occluded but this appears chronic and the native RCA also has a chronic occlusion and is well collateralized. The first diagonal is small and also has collaterals. I doubt this was grafted and no other grafts identified. I think his presentation was due to decompensated CHF with arrhythmia and Hypertensive urgency. Will admit to ICU. Consult CCM for management of vent. Continue IV Ntg and diurese with  IV lasix. Will resume IV heparin until cardiac enzymes are cycled. Check Echo.  Patient Profile     71 y.o. male with PMH of CABG in 200 at [facility] presented to [facility] with chest pain, volume overload, hypertensive emergency, persistent runs of NSVT and what was thought to be STEMI; he had not followed up with medical care and been without medicines for some time. He underwent emergent heart cath which did not reveal culprit lesion; it showed 2 patent grafts and chronic RCA graft occlusion with collaterals.   Assessment & Plan    NSTEMI CAD: Cath with no culprit lesion found; event thought to be product of decompensated CHF in combination with hypertensive emergency and VT. Trop up to 12 this AM, next pending. Hgb stable; Cr stable from admission at 1.4. Echo shows apical and mid-distal inferoapical thinning and akinesis suggestive of extensive LAD territory infarct. --asa --on heparin gtt --atorvastatin '80mg'$  daily --can start BB once CHF exacerbation stabilizes if BP and HR tolerate  NSVtach: Currently in sinus rhythm but does have frequent NSVT runs on tele. On amiodarone drip.  Respiratory failure Acute CHF exacerbation: Extubated this AM - appreciate PCCM assistance. CXR with improved edema compared to yesterday. EF 25-30% with elevated biventricular filling pressures. --continue IV lasix '40mg'$  BID --daily weight, ins/outs  Newly diagnosed T2DM: A1c 7. --SSI while inpatient.  Care management  consulted for medication assistance.   For questions or updates, please contact [place] Please consult [email or web address] for contact info under Cardiology/STEMI.  Signed, Alphonzo Grieve, MD  12/17/2016, 10:38 AM    I have examined the patient and reviewed assessment and plan and discussed with patient.  Agree with above as stated.  DOing well post extubation.  Start low dose ACE-I.  Wean NTG.  Stop heparin tomorrow as part of medical therapy for NSTEMI.  Decrease Lasix as well.  Check renal function in AM.   Larae Grooms

## 2016-12-17 NOTE — Progress Notes (Signed)
ANTICOAGULATION CONSULT NOTE - Follow-Up Consult  Pharmacy Consult for heparin  Indication: chest pain/ACS  No Known Allergies  Patient Measurements: Height: '5\' 9"'$  (175.3 cm) Weight: 201 lb 1 oz (91.2 kg) IBW/kg (Calculated) : 70.7 Heparin Dosing Weight: 90kg  Vital Signs: Temp: 98.7 F (37.1 C) (11/27 1500) Temp Source: Oral (11/27 1500) BP: 116/54 (11/27 2000) Pulse Rate: 76 (11/27 2000)  Labs: Recent Labs    12/16/16 1734 12/16/16 1848 12/16/16 2302 12/17/16 0629 12/17/16 0916 12/17/16 1042 12/17/16 1907  HGB 18.4* 17.3*  --  15.9  --   --   --   HCT 55.1* 51.0  --  47.1  --   --   --   PLT 153  --   --  131*  --   --   --   APTT 28  --   --   --   --   --   --   LABPROT 13.3  --   --   --   --   --   --   INR 1.02  --   --   --   --   --   --   HEPARINUNFRC  --   --   --   --  0.29*  --  0.25*  CREATININE 1.43* 1.20  --  1.42*  --  1.28*  --   TROPONINI 0.05*  --  4.99* 11.89* 9.94*  --   --     Estimated Creatinine Clearance: 59.1 mL/min (A) (by C-G formula based on SCr of 1.28 mg/dL (H)).   Medical History: Past Medical History:  Diagnosis Date  . Coronary artery disease   . Hyperlipidemia   . Hypertension   . MI (myocardial infarction) ([place])     Medications:  Medications Prior to Admission  Medication Sig Dispense Refill Last Dose  . ranitidine (ZANTAC) 150 MG tablet Take 150 mg by mouth 2 (two) times daily as needed for heartburn.      Scheduled:  . aspirin EC  81 mg Oral Daily  . atorvastatin  80 mg Oral q1800  . chlorhexidine gluconate (MEDLINE KIT)  15 mL Mouth Rinse BID  . lisinopril  5 mg Oral Daily  . mouth rinse  15 mL Mouth Rinse QID  . pantoprazole (PROTONIX) IV  40 mg Intravenous Daily  . sodium chloride flush  3 mL Intravenous Q12H    Assessment: 71 yo male s/p STEMI and cath. Heparin restarted 8 hours after sheath removal (removed ~ 7:30pm). Heparin level remains subtherapeutic at 0.25 on 1350 units/hr  Goal of Therapy:   Heparin level 0.3-0.7 units/ml Monitor platelets by anticoagulation protocol: Yes   Plan:  -Increase heparin to 1500  units/hr -Daily heparin level and CBC  Hildred Laser, Pharm D 12/17/2016 8:24 PM

## 2016-12-18 ENCOUNTER — Other Ambulatory Visit: Payer: Self-pay

## 2016-12-18 ENCOUNTER — Inpatient Hospital Stay (HOSPITAL_COMMUNITY): Payer: Medicare Other

## 2016-12-18 ENCOUNTER — Telehealth: Payer: Self-pay

## 2016-12-18 DIAGNOSIS — E876 Hypokalemia: Secondary | ICD-10-CM

## 2016-12-18 LAB — GLUCOSE, CAPILLARY
GLUCOSE-CAPILLARY: 181 mg/dL — AB (ref 65–99)
Glucose-Capillary: 136 mg/dL — ABNORMAL HIGH (ref 65–99)
Glucose-Capillary: 157 mg/dL — ABNORMAL HIGH (ref 65–99)

## 2016-12-18 LAB — CBC
HEMATOCRIT: 46.6 % (ref 39.0–52.0)
HEMOGLOBIN: 15.6 g/dL (ref 13.0–17.0)
MCH: 31.4 pg (ref 26.0–34.0)
MCHC: 33.5 g/dL (ref 30.0–36.0)
MCV: 93.8 fL (ref 78.0–100.0)
Platelets: 114 10*3/uL — ABNORMAL LOW (ref 150–400)
RBC: 4.97 MIL/uL (ref 4.22–5.81)
RDW: 13.6 % (ref 11.5–15.5)
WBC: 9.3 10*3/uL (ref 4.0–10.5)

## 2016-12-18 LAB — BASIC METABOLIC PANEL
ANION GAP: 9 (ref 5–15)
BUN: 16 mg/dL (ref 6–20)
CHLORIDE: 101 mmol/L (ref 101–111)
CO2: 28 mmol/L (ref 22–32)
CREATININE: 1.42 mg/dL — AB (ref 0.61–1.24)
Calcium: 8.7 mg/dL — ABNORMAL LOW (ref 8.9–10.3)
GFR calc non Af Amer: 48 mL/min — ABNORMAL LOW (ref >60)
GFR, EST AFRICAN AMERICAN: 56 mL/min — AB (ref >60)
Glucose, Bld: 158 mg/dL — ABNORMAL HIGH (ref 65–99)
Potassium: 3.2 mmol/L — ABNORMAL LOW (ref 3.5–5.1)
Sodium: 138 mmol/L (ref 135–145)

## 2016-12-18 LAB — PHOSPHORUS: Phosphorus: 3.8 mg/dL (ref 2.5–4.6)

## 2016-12-18 LAB — HEPARIN LEVEL (UNFRACTIONATED): HEPARIN UNFRACTIONATED: 0.37 [IU]/mL (ref 0.30–0.70)

## 2016-12-18 MED ORDER — INSULIN ASPART 100 UNIT/ML ~~LOC~~ SOLN
0.0000 [IU] | Freq: Three times a day (TID) | SUBCUTANEOUS | Status: DC
  Administered 2016-12-18 (×2): 2 [IU] via SUBCUTANEOUS
  Administered 2016-12-18 – 2016-12-19 (×2): 1 [IU] via SUBCUTANEOUS
  Administered 2016-12-19: 2 [IU] via SUBCUTANEOUS

## 2016-12-18 MED ORDER — PANTOPRAZOLE SODIUM 40 MG PO TBEC
40.0000 mg | DELAYED_RELEASE_TABLET | Freq: Every day | ORAL | Status: DC
  Filled 2016-12-18: qty 1

## 2016-12-18 MED ORDER — POTASSIUM CHLORIDE CRYS ER 20 MEQ PO TBCR
40.0000 meq | EXTENDED_RELEASE_TABLET | Freq: Once | ORAL | Status: AC
  Administered 2016-12-18: 40 meq via ORAL
  Filled 2016-12-18: qty 2

## 2016-12-18 MED ORDER — LIVING WELL WITH DIABETES BOOK
Freq: Once | Status: AC
  Administered 2016-12-18: 15:00:00
  Filled 2016-12-18 (×3): qty 1

## 2016-12-18 NOTE — Progress Notes (Signed)
ANTICOAGULATION CONSULT NOTE - Follow-Up Consult  Pharmacy Consult for heparin  Indication: chest pain/ACS  No Known Allergies  Patient Measurements: Height: '5\' 9"'$  (175.3 cm) Weight: 196 lb 11.2 oz (89.2 kg) IBW/kg (Calculated) : 70.7 Heparin Dosing Weight: 90kg  Vital Signs: Temp: 98.6 F (37 C) (11/28 0700) Temp Source: Oral (11/28 0700) BP: 125/62 (11/28 0700) Pulse Rate: 63 (11/28 0700)  Labs: Recent Labs    12/16/16 1734 12/16/16 1848 12/16/16 2302 12/17/16 0629 12/17/16 0916 12/17/16 1042 12/17/16 1907 12/18/16 0412  HGB 18.4* 17.3*  --  15.9  --   --   --  15.6  HCT 55.1* 51.0  --  47.1  --   --   --  46.6  PLT 153  --   --  131*  --   --   --  114*  APTT 28  --   --   --   --   --   --   --   LABPROT 13.3  --   --   --   --   --   --   --   INR 1.02  --   --   --   --   --   --   --   HEPARINUNFRC  --   --   --   --  0.29*  --  0.25* 0.37  CREATININE 1.43* 1.20  --  1.42*  --  1.28*  --  1.42*  TROPONINI 0.05*  --  4.99* 11.89* 9.94*  --   --   --     Estimated Creatinine Clearance: 52.7 mL/min (A) (by C-G formula based on SCr of 1.42 mg/dL (H)).   Medical History: Past Medical History:  Diagnosis Date  . Coronary artery disease   . Hyperlipidemia   . Hypertension   . MI (myocardial infarction) ([place])     Medications:  Medications Prior to Admission  Medication Sig Dispense Refill Last Dose  . ranitidine (ZANTAC) 150 MG tablet Take 150 mg by mouth 2 (two) times daily as needed for heartburn.      Scheduled:  . aspirin EC  81 mg Oral Daily  . atorvastatin  80 mg Oral q1800  . chlorhexidine gluconate (MEDLINE KIT)  15 mL Mouth Rinse BID  . insulin aspart  0-9 Units Subcutaneous TID WC  . lisinopril  5 mg Oral Daily  . mouth rinse  15 mL Mouth Rinse QID  . pantoprazole (PROTONIX) IV  40 mg Intravenous Daily  . potassium chloride  40 mEq Oral Once  . sodium chloride flush  3 mL Intravenous Q12H    Assessment: 71 yo male s/p STEMI and cath.  Heparin restarted 8 hours after sheath removal (removed ~ 7:30pm). Heparin level subtherapeutic at 0.25 on 1350 units/hr. Rate was increased to 1500 units/hr and level is now therapeutic at 0.37.  Goal of Therapy:  Heparin level 0.3-0.7 units/ml Monitor platelets by anticoagulation protocol: Yes   Plan:  -Continue heparin at 1500  units/hr -Daily heparin level and [place], PharmD Pharmacy Resident [phone] 12/18/2016 7:52 AM

## 2016-12-18 NOTE — Progress Notes (Signed)
PULMONARY / CRITICAL CARE MEDICINE   Name: Luis Donaldson MRN: [id number] DOB: [birth year]    ADMISSION DATE:  12/16/2016 CONSULTATION DATE:  12/16/2016  REFERRING MD:  Dr. [place]  CHIEF COMPLAINT:  STEMI  HISTORY OF PRESENT ILLNESS:  HPI obtained from medical chart review as patient is intubated and sedated.   71 year old male with past medical history of tobacco abuse, CAD, prior MI, and CABG in 2000 and non-compliance with poor medical follow up who presented 11/26 with chest pain.  Patient reported onset of chest pain around 0900 today, intermittent, unrelieved with zantac at home.  His neighbor called 911 this evening, found by EMS in [place] and hypertensive 190/102 with respiratory distress. Treated with lidocaine, aspirin, and lasix for pulmonary edema.    In ER, EKG noted ST elevation in anterior leads and ongoing NSVT.  Placed on amiodarone gtt.  Intubated in the ER and taken emergently for cardiac catheterization.  PCCM consulted for ventilator management.    SUBJECTIVE:  Extubated on 11/27 and doing well, no new complaints  VITAL SIGNS: BP 127/60 (BP Location: Left Arm)   Pulse (!) 59   Temp 98.6 F (37 C) (Oral)   Resp 11   Ht 5\' 9"  (1.753 m)   Wt 89.2 kg (196 lb 11.2 oz)   SpO2 95%   BMI 29.05 kg/m   HEMODYNAMICS:    VENTILATOR SETTINGS:    INTAKE / OUTPUT: I/O last 3 completed shifts: In: 1663.6 [P.O.:450; I.V.:1213.6] Out: 3920 [Urine:3920]  PHYSICAL EXAMINATION: General:  Well appearing, NAD HEENT: [place]/AT, PERRL, EOM-I and MMM Neuro: Alert and interactive CV: IRIR, Nl S1/S2, -M/R/G. PULM: CTA bilaterally GI: Soft, NT, ND and +BS Extremities: Left leg pain due to IO, no edema Skin: Intact  LABS:  BMET Recent Labs  Lab 12/17/16 0629 12/17/16 1042 12/18/16 0412  NA 137 136 138  K 4.1 4.0 3.2*  CL 100* 103 101  CO2 26 21* 28  BUN 11 12 16   CREATININE 1.42* 1.28* 1.42*  GLUCOSE 116* 151* 158*   Electrolytes Recent Labs  Lab  12/16/16 1734 12/16/16 2302  12/17/16 0629 12/17/16 0916 12/17/16 1042 12/18/16 0412  CALCIUM 9.1  --   --  8.5*  --  8.8* 8.7*  MG 2.1 2.3  --  2.1  --   --   --   PHOS  --   --    < > 4.4 4.0 3.5 3.8   < > = values in this interval not displayed.   CBC Recent Labs  Lab 12/16/16 1734 12/16/16 1848 12/17/16 0629 12/18/16 0412  WBC 11.6*  --  8.9 9.3  HGB 18.4* 17.3* 15.9 15.6  HCT 55.1* 51.0 47.1 46.6  PLT 153  --  131* 114*   Coag's Recent Labs  Lab 12/16/16 1734  APTT 28  INR 1.02   Sepsis Markers No results for input(s): LATICACIDVEN, PROCALCITON, O2SATVEN in the last 168 hours.  ABG Recent Labs  Lab 12/16/16 2119  PHART 7.342*  PCO2ART 42.9  PO2ART 179.0*   Liver Enzymes Recent Labs  Lab 12/16/16 1734 12/17/16 0629  AST 31  --   ALT 33  --   ALKPHOS 126  --   BILITOT 1.2  --   ALBUMIN 4.4 3.5   Cardiac Enzymes Recent Labs  Lab 12/16/16 2302 12/17/16 0629 12/17/16 0916  TROPONINI 4.99* 11.89* 9.94*   Glucose Recent Labs  Lab 12/16/16 2058 12/16/16 2323 12/17/16 0356 12/17/16 0717 12/18/16 0751  GLUCAP  219* 150* 89 115* 136*   Imaging Dg Chest Port 1 View  Result Date: 12/18/2016 CLINICAL DATA:  71 year old male status post STEMI. Status post intubation and cardiac catheterization. EXAM: PORTABLE CHEST 1 VIEW COMPARISON:  12/17/2016 and earlier. FINDINGS: Portable AP semi upright view at 0527 hours. Extubated. Enteric tube removed. Stable lung volumes. Stable cardiac size and mediastinal contours. No pneumothorax. Further decreased pulmonary vascularity improved left lung base ventilation. Mild nonspecific patchy opacity at the right lung base. No definite pleural effusion. IMPRESSION: 1. Extubated and enteric tube removed.  Stable lung volumes. 2. Further regression of pulmonary vascularity, and improved left lung base ventilation. 3. Mild patchy opacity at the right lung base, favor atelectasis. Electronically Signed   By: Genevie Ann M.D.    On: 12/18/2016 08:37   STUDIES:  12/16/2016 LHC>> 1.  Severe 3 vessel occlusive CAD 2.  Patent LIMA to the LAD 3.  Patent free radial graft to the OM1 4.  Occluded SVG to the RCA. This appears chronic.  5.  The RCA and first diagonal are filled by collaterals 6.  Severe LV dysfunction. EF estimated at 25-30% 7.  Markedly elevated LVEDP.  CULTURES: MRSA PCR 12/16/2016 >> Negative  ANTIBIOTICS: Not clinically indicated  SIGNIFICANT EVENTS: 12/16/2016 Admit STEMI, intubated, LHC  LINES/TUBES: PIV x 3 Left tibial IO out ETT 11/26 >>11/27 OGT 11/26 >>11/27  I reviewed CXR myself, no acute disease noted.   DISCUSSION: 4 yoM w/PMH of CAD and prior CABG in 2000 with poor medical compliance presented with onset of CP today found to have anterior STEMI, NSVT, and hypertensive with acute pulmonary edema intubated and taken for emergent cardiac cath.  Discussed with PCCM-NP  ASSESSMENT / PLAN:  PULMONARY A: Acute hypoxic respiratory failure secondary to acute pulmonary edema in the setting of STEMI/ hypertensive crisis Tobacco Abuse  - CXR 11/27: Cardiomegaly with normal pulmonary vascularity. Interim improvement of interstitial prominence suggesting improving CHF. No prominent pleural effusion Weaning well this am Plan: Titrate O2 for sat of 88-92% Tobacco abuse counseling when appropriate  IS Ambulate  CARDIOVASCULAR A:  Anterior STEMI NSVT Hypertensive Crisis Hx CAD, prior MI, CABG in 2000 Cholesterol 232, LDL 176, HDL 35 Continued short runs with bigeminal rhythm P:  Cardiology Primary>> plan to treat medically Tele monitoring Heparin drip per cards ASA daily  RENAL A:   AKI- unknown baseline sCr P:   Hold further diureses for now BMET in AM Replace electrolytes as indicated Avoid nephrotoxic medications  GASTROINTESTINAL A:   NPO - LFTs wnl P:   Diet as ordered PPI for SUP  HEMATOLOGIC A:   Slight leukocytosis Elevated H/H, normal  plts Heparin gtt P:  Trend CBC  Monitor for bleeding Systemic heparin as above and SCDs for VTE ppx Transfuse per ICU protocol  INFECTIOUS A:   Leukocytosis- mild, likely reactive>> resolved P:   Monitor fever curve/ WBC Culture if clinically indicated  ENDOCRINE A:   Hyperglycemia P:   CBG q 4 SSI  NEUROLOGIC A:   Acute encephalopathy- related to sedation>> resolved Awake and alert, following commands P:   RASS goal: -1 D/C all sedation  FAMILY  - Updates: Patient updated bedside  - Inter-disciplinary family meet or Palliative Care meeting due by: 12/23/2016  PCCM will sign off, please call back if needed.  Rush Farmer, M.D. [facility] Pulmonary/Critical Care Medicine. Pager: [phone]. After hours pager: [phone].

## 2016-12-18 NOTE — Progress Notes (Signed)
Progress Note  Patient Name: Luis Donaldson Date of Encounter: 12/18/2016  Primary Cardiologist: new, [place]  Subjective   Patient denies chest pain, shortness of breath. He is still having a productive cough which he states is worse today. He thinks he was able to sleep completely flat. He endorses diffuse muscle aches which he states are from struggling during IO insertion.  Inpatient Medications    Scheduled Meds: . aspirin EC  81 mg Oral Daily  . atorvastatin  80 mg Oral q1800  . chlorhexidine gluconate (MEDLINE KIT)  15 mL Mouth Rinse BID  . lisinopril  5 mg Oral Daily  . mouth rinse  15 mL Mouth Rinse QID  . pantoprazole (PROTONIX) IV  40 mg Intravenous Daily  . potassium chloride  40 mEq Oral Once  . sodium chloride flush  3 mL Intravenous Q12H   Continuous Infusions: . sodium chloride 10 mL/hr at 12/16/16 2109  . sodium chloride    . heparin 1,500 Units/hr (12/17/16 2249)  . nitroGLYCERIN Stopped (12/17/16 1540)   PRN Meds: sodium chloride, acetaminophen, bisacodyl, docusate, ipratropium-albuterol, nitroGLYCERIN, ondansetron (ZOFRAN) IV, sodium chloride flush   Vital Signs    Vitals:   12/18/16 0500 12/18/16 0600 12/18/16 0635 12/18/16 0700  BP: (!) 103/49 109/70  125/62  Pulse: 64 65  63  Resp: _0 Temp: 98.8 F (37.1 C)   98.6 F (37 C)  TempSrc: Oral   Oral  SpO2: 91% 97%  93%  Weight:   196 lb 11.2 oz (89.2 kg)   Height:        Intake/Output Summary (Last 24 hours) at 12/18/2016 0724 Last data filed at 12/18/2016 0600 Gross per 24 hour  Intake 903.65 ml  Output 1845 ml  Net -941.35 ml   Filed Weights   12/16/16 2015 12/17/16 0452 12/18/16 0635  Weight: 203 lb 4.2 oz (92.2 kg) 201 lb 1 oz (91.2 kg) 196 lb 11.2 oz (89.2 kg)    Telemetry    SR, bigeminy, frequent PVC - Personally Reviewed, 12/18/2016  ECG    SR, 1st degree AV block - Personally Reviewed, 12/18/2016  Physical Exam   GEN: No acute distress, sitting in recliner.     Neck: No JVD Cardiac: RRR, no murmurs, rubs, or gallops.  Respiratory: CTAB, no increased work of breathing. GI: Soft, nontender, non-distended  MS: No edema; left tibial IO site clean, nontender. Neuro:  Nonfocal  Psych: Normal affect   Labs    Chemistry Recent Labs  Lab 12/16/16 1734  12/17/16 0629 12/17/16 1042 12/18/16 0412  NA 135   < > 137 136 138  K 4.3   < > 4.1 4.0 3.2*  CL 101   < > 100* 103 101  CO2 19*  --  26 21* 28  GLUCOSE 219*   < > 116* 151* 158*  BUN 9   < > _1 CREATININE 1.43*   < > 1.42* 1.28* 1.42*  CALCIUM 9.1  --  8.5* 8.8* 8.7*  PROT 7.6  --   --   --   --   ALBUMIN 4.4  --  3.5  --   --   AST 31  --   --   --   --   ALT 33  --   --   --   --   ALKPHOS 126  --   --   --   --   BILITOT 1.2  --   --   --   --  GFRNONAA 48*  --  48* 55* 48*  GFRAA 55*  --  56* >60 56*  ANIONGAP 15  --  _0 < > = values in this interval not displayed.     Hematology Recent Labs  Lab 12/16/16 1734 12/16/16 1848 12/17/16 0629 12/18/16 0412  WBC 11.6*  --  8.9 9.3  RBC 5.75  --  4.98 4.97  HGB 18.4* 17.3* 15.9 15.6  HCT 55.1* 51.0 47.1 46.6  MCV 95.8  --  94.6 93.8  MCH 32.0  --  31.9 31.4  MCHC 33.4  --  33.8 33.5  RDW 13.5  --  13.8 13.6  PLT 153  --  131* 114*    Cardiac Enzymes Recent Labs  Lab 12/16/16 1734 12/16/16 2302 12/17/16 0629 12/17/16 0916  TROPONINI 0.05* 4.99* 11.89* 9.94*   No results for input(s): TROPIPOC in the last 168 hours.   BNP Recent Labs  Lab 12/16/16 2302  BNP 605.8*     DDimer No results for input(s): DDIMER in the last 168 hours.   Radiology    Portable Chest Xray  Result Date: 12/17/2016 CLINICAL DATA:  Intubation. EXAM: PORTABLE CHEST 1 VIEW COMPARISON:  12/16/2016. FINDINGS: Endotracheal tube and NG tube in stable position. Prior median sternotomy. Cardiomegaly with normal pulmonary vascularity. Interim improvement of interstitial prominence suggesting improving CHF. No prominent pleural  effusion. Costophrenic angles incompletely imaged. No pneumothorax identified. IMPRESSION: 1.  Endotracheal tube and NG tube in stable position. 2. Prior CABG. Stable cardiomegaly. Interim improvement of interstitial prominence suggesting improving CHF. Electronically Signed   By: Marcello Moores  Register   On: 12/17/2016 08:42   Dg Chest Port 1 View  Result Date: 12/16/2016 CLINICAL DATA:  Endotracheal tube placement. EXAM: PORTABLE CHEST 1 VIEW COMPARISON:  None. FINDINGS: Mild cardiomegaly is noted. Endotracheal tube is seen projected over tracheal air shadow with distal tip 7 cm above the carina. Nasogastric tube is seen entering the stomach. No pneumothorax or pleural effusion is noted. Mild central pulmonary vascular congestion and possible perihilar edema is noted. Bony thorax is unremarkable. IMPRESSION: Endotracheal and nasogastric tubes in grossly good position. Mild cardiomegaly with central pulmonary vascular congestion is noted. Possible bilateral perihilar edema. Electronically Signed   By: Marijo Conception, M.D.   On: 12/16/2016 18:24    Cardiac Studies   LHC 12/16/2016: 1.  Severe 3 vessel occlusive CAD 2.  Patent LIMA to the LAD 3.  Patent free radial graft to the OM1 4.  Occluded SVG to the RCA. This appears chronic.  5.  The RCA and first diagonal are filled by collaterals 6.  Severe LV dysfunction. EF estimated at 25-30% 7.  Markedly elevated LVEDP. Plan: I am unable to identify any culprit lesion. The LAD and LCx territories are well revascularized. The SVG to the RCA is occluded but this appears chronic and the native RCA also has a chronic occlusion and is well collateralized. The first diagonal is small and also has collaterals. I doubt this was grafted and no other grafts identified. I think his presentation was due to decompensated CHF with arrhythmia and Hypertensive urgency. Will admit to ICU. Consult CCM for management of vent. Continue IV Ntg and diurese with IV lasix. Will  resume IV heparin until cardiac enzymes are cycled. Check Echo.  Echo 12/17/2016: - Left ventricle: The cavity size was normal. Systolic function was   severely reduced. The estimated ejection fraction was in the   range of 25% to 30%.  Anterior wall, apical and mid to distal   inferoapical thinning and akinesis suggestive of extensive LAD   territory infarct, basal to mid inferior wall and lateral wall   hypokinesis. Doppler parameters are consistent with abnormal left   ventricular relaxation (grade 1 diastolic dysfunction). LV   filling pressure appears elevated (b-bump noted on mitral   m-mode). - Aortic valve: Sclerosis without stenosis. There was trivial   regurgitation. - Mitral valve: Mildly thickened leaflets . Systolic bowing without   prolapse. There was mild regurgitation. - Left atrium: The atrium was normal in size. - Right ventricle: The cavity size was normal. Mild hypokinesis. - Systemic veins: The IVC measures <2.1 cm, but does not collapse   >50%, suggesting an elevated RA pressure of 8 mmHg.  Patient Profile     71 y.o. male with PMH of CABG in 200 at [facility] presented to [facility] with chest pain, volume overload, hypertensive emergency, persistent runs of NSVT and what was thought to be STEMI; he had not followed up with medical care and been without medicines for some time. He underwent emergent heart cath which did not reveal culprit lesion; it showed 2 patent grafts and chronic RCA graft occlusion with collaterals.   Assessment & Plan    NSTEMI CAD: Cath with no culprit lesion found; event thought to be product of decompensated CHF in combination with hypertensive emergency and VT. Trop peakedat 12. Hgb stable; Cr stable from admission at 1.4. Echo shows apical and mid-distal inferoapical thinning and akinesis suggestive of extensive LAD territory infarct. --asa --on heparin gtt - can stop today --atorvastatin 72m daily --nitro gtt has been weaned off  yesterday --can start BB once CHF exacerbation stabilizes if BP and HR tolerate  NSVtach: Currently in SR with still significant but less frequent PVCs and bigeminy. Amiodarone has been stopped.  Acute CHF exacerbation: EF 25-30% with elevated biventricular filling pressures. Weight down 3kgs since admission. He appears euvolemic today. --daily weight, ins/outs --lisinopril --start BB possibly tomorrow if BP and HR can tolerate - currently HR in 60s mainly.  HTN: Stable today. --lisinopril 512mdaily  Hypokalemia: K 3.2 this AM. Replete.  Newly diagnosed T2DM: A1c 7. --SSI while inpatient.  Care management consulted for medication assistance. PT/OT consulted.   For questions or updates, please contact [facility] consult [email or web address] for contact info under Cardiology/STEMI.  Signed, GoAlphonzo GrieveMD  12/18/2016, 7:24 AM    I have examined the patient and reviewed assessment and plan and discussed with patient.  Agree with above as stated.  Doing well.  Can move to telemetry.  Start low dose beta blocker today.  Wean oxygen.  Ambulate.  Will have to d/c IO access site. Watch renal function as lisinopril was started yesterday.  Recheck electrolytes in AM.  JaLarae Grooms

## 2016-12-18 NOTE — Care Management Note (Addendum)
Case Management Note  Patient Details  Name: Luis Donaldson MRN: [id number] Date of Birth: 10-31-1945  Subjective/Objective:   From home alone, pta indep, he does not use any assistive devices at home.  He presents with STEMI, s/p heart cath, no intervention done, will be medically management, on hep drip.  Most likely home tomorrow. He goes to a urgent care on [address], no particular PCP, will see if can get apt at [facility] clinic, no apts available.  NCM informed patient to call and make apt. NCM also called Opal Sidles with [facility] to see if she could assist.   He states he has no medication coverage, he pays for medications himself.  NCM asked why he does not have medication coverage, he says it is too expensive.  NCM informed him that the enrollment period for Medicare part D ends Dec 7.  He states he will call his insurance person to help him with this, his supplement just helps with covering the gap.   PT seeing him today, Await pt eval for recs.   Per pt eval rec HHPT , he chose [facility] , referral made to [facility] for [place].                  Action/Plan: NCM will follow for dc needs.   Expected Discharge Date:                  Expected Discharge Plan:  Home/Self Care  In-House Referral:     Discharge planning Services     Post Acute Care Choice:    Choice offered to:     DME Arranged:    DME Agency:     HH Arranged:    HH Agency:     Status of Service:  In process, will continue to follow  If discussed at Long Length of Stay Meetings, dates discussed:    Additional Comments:  Zenon Mayo, RN 12/18/2016, 10:59 AM

## 2016-12-18 NOTE — Telephone Encounter (Signed)
Call received from Tomi Bamberger, RN CM requesting a hospital follow up appointment for the patient at [facility].  Informed her that there are no appointments available at this time but the patient can call the clinic on Monday, 12/23/16 to check for availability - he can call the main number or this CM at the clinic.

## 2016-12-18 NOTE — Evaluation (Signed)
Physical Therapy Evaluation Patient Details Name: Luis Donaldson MRN: [id number] DOB: December 11, 1945 Today's Date: 12/18/2016   History of Present Illness  71 y.o. male with PMH of CABG in 61 at [facility] presented to [facility] with chest pain, volume overload, hypertensive emergency, persistent runs of NSVT and what was thought to be STEMI; he had not followed up with medical care and been without medicines for some time. He underwent emergent heart cath which did not reveal culprit lesion; it showed 2 patent grafts and chronic RCA graft occlusion with collaterals.   Clinical Impression  Patient presents with decreased mobility due to pain in L LE and decreased endurance with deconditioning.  Feel he will benefit from skilled PT in the acute setting to allow return home with intermittent assist and follow up HHPT.  States can borrow eqipment from family and will need outpatient cardiac rehab after finished with HH.      Follow Up Recommendations Home health PT;Supervision - Intermittent    Equipment Recommendations  None recommended by PT(plans to borrow equipment)    Recommendations for Other Services       Precautions / Restrictions Precautions Precautions: Fall Restrictions RLE Weight Bearing: Weight bearing as tolerated      Mobility  Bed Mobility               General bed mobility comments: up in chair  Transfers Overall transfer level: Needs assistance Equipment used: Rolling walker (2 wheeled) Transfers: Sit to/from Stand Sit to Stand: Min assist         General transfer comment: cues for technique, increased time  Ambulation/Gait Ambulation/Gait assistance: Min assist;Min guard Ambulation Distance (Feet): 25 Feet Assistive device: Rolling walker (2 wheeled) Gait Pattern/deviations: Step-to pattern;Step-through pattern;Decreased stride length;Antalgic     General Gait Details: Patient fearful of pain on L LE< but able to tolerate weight on leg with some use of  walker, cues for safety with turns and backing up to keep walker close and for increased distance.  Very slow with frequent stops as pt conversing  Financial trader [employer] (Stroke Patients Only)       Balance Overall balance assessment: Needs assistance   Sitting balance-Leahy Scale: Fair     Standing balance support: Bilateral upper extremity supported Standing balance-Leahy Scale: Poor Standing balance comment: UE support for balance                             Pertinent Vitals/Pain Pain Assessment: Faces Faces Pain Scale: Hurts little more Pain Location: R leg at sight of IO Pain Descriptors / Indicators: Sore Pain Intervention(s): Monitored during session;Repositioned    Home Living Family/patient expects to be discharged to:: Private residence Living Arrangements: Alone Available Help at Discharge: Friend(s);Available PRN/intermittently Type of Home: House Home Access: Stairs to enter Entrance Stairs-Rails: Right;Left Entrance Stairs-Number of Steps: 5 & 4 Home Layout: Two level;Able to live on main level with bedroom/bathroom Home Equipment: None      Prior Function Level of Independence: Independent               Hand Dominance        Extremity/Trunk Assessment   Upper Extremity Assessment Upper Extremity Assessment: Overall WFL for tasks assessed    Lower Extremity Assessment Lower Extremity Assessment: LLE deficits/detail LLE Deficits / Details: WFL AAROM,. but painful and strength grossly 3-/5 knee  extension       Communication   Communication: No difficulties  Cognition Arousal/Alertness: Awake/alert Behavior During Therapy: WFL for tasks assessed/performed Overall Cognitive Status: Within Functional Limits for tasks assessed                                        General Comments      Exercises General Exercises - Lower Extremity Ankle Circles/Pumps: AROM;5  reps;Seated Heel Slides: AAROM;Both;Seated;5 reps   Assessment/Plan    PT Assessment Patient needs continued PT services  PT Problem List Decreased strength;Decreased mobility;Pain;Decreased knowledge of use of DME;Decreased balance;Decreased activity tolerance;Decreased range of motion       PT Treatment Interventions DME instruction;Therapeutic activities;Gait training;Therapeutic exercise;Patient/family education;Stair training;Balance training;Functional mobility training    PT Goals (Current goals can be found in the Care Plan section)  Acute Rehab PT Goals Patient Stated Goal: To return to independent PT Goal Formulation: With patient Time For Goal Achievement: 12/25/16 Potential to Achieve Goals: Good    Frequency Min 3X/week   Barriers to discharge Decreased caregiver support lives alone, but will have intermittent help    Co-evaluation               AM-PAC PT "6 Clicks" Daily Activity  Outcome Measure Difficulty turning over in bed (including adjusting bedclothes, sheets and blankets)?: A Little Difficulty moving from lying on back to sitting on the side of the bed? : Unable Difficulty sitting down on and standing up from a chair with arms (e.g., wheelchair, bedside commode, etc,.)?: Unable Help needed moving to and from a bed to chair (including a wheelchair)?: A Little Help needed walking in hospital room?: A Little Help needed climbing 3-5 steps with a railing? : A Lot 6 Click Score: 13    End of Session Equipment Utilized During Treatment: Gait belt Activity Tolerance: Patient tolerated treatment well Patient left: in chair;with call bell/phone within reach;with family/visitor present   PT Visit Diagnosis: Other abnormalities of gait and mobility (R26.89);Pain Pain - Right/Left: Left Pain - part of body: Leg    Time: 1040-1140 PT Time Calculation (min) (ACUTE ONLY): 60 min   Charges:   PT Evaluation $PT Eval Moderate Complexity: 1 Mod PT  Treatments $Gait Training: 23-37 mins $Therapeutic Activity: 8-22 mins   PT G CodesMagda Kiel, [place] [phone] 12/18/2016   Reginia Naas 12/18/2016, 11:58 AM

## 2016-12-18 NOTE — Evaluation (Signed)
Occupational Therapy Evaluation Patient Details Name: Luis Donaldson MRN: [id number] DOB: 04-Aug-1945 Today's Date: 12/18/2016    History of Present Illness 71 y.o. male with PMH of CABG in 2000 at [facility] presented to [facility] with chest pain, volume overload, hypertensive emergency, persistent runs of NSVT and what was thought to be STEMI; he had not followed up with medical care and been without medicines for some time. He underwent emergent heart cath which did not reveal culprit lesion; it showed 2 patent grafts and chronic RCA graft occlusion with collaterals.    Clinical Impression   Pt is typically independent. Presents with generalized pain, decreased balance in standing and some impairment in cognition. Pt is overall functioning at a min to supervision level. His friend will stay with him upon discharge. Will follow acutely.   Follow Up Recommendations  Home health OT;Supervision/Assistance - 24 hour    Equipment Recommendations  None recommended by OT    Recommendations for Other Services       Precautions / Restrictions Precautions Precautions: Fall Restrictions Weight Bearing Restrictions: No      Mobility Bed Mobility Overal bed mobility: Needs Assistance Bed Mobility: Supine to Sit;Sit to Supine     Supine to sit: Min assist Sit to supine: Min guard   General bed mobility comments: assist to raise trunk, increased time  Transfers Overall transfer level: Needs assistance Equipment used: Rolling walker (2 wheeled) Transfers: Sit to/from Stand Sit to Stand: Min guard         General transfer comment: cues for technique, increased time    Balance Overall balance assessment: Needs assistance   Sitting balance-Leahy Scale: Fair     Standing balance support: Bilateral upper extremity supported Standing balance-Leahy Scale: Poor Standing balance comment: UE support for balance                           ADL either performed or assessed with  clinical judgement   ADL Overall ADL's : Needs assistance/impaired Eating/Feeding: Independent;Sitting   Grooming: Wash/dry hands;Standing;Supervision/safety   Upper Body Bathing: Minimal assistance;Sitting   Lower Body Bathing: Minimal assistance;Sit to/from stand   Upper Body Dressing : Set up;Supervision/safety;Sitting   Lower Body Dressing: Minimal assistance;Sit to/from stand   Toilet Transfer: Min guard;Ambulation;RW   Toileting- Water quality scientist and Hygiene: Min guard;Sit to/from stand       Functional mobility during ADLs: Min guard;Rolling walker General ADL Comments: used RW due to pain     Vision Baseline Vision/History: Wears glasses Wears Glasses: Reading only       Perception     Praxis      Pertinent Vitals/Pain Pain Assessment: Faces Faces Pain Scale: Hurts little more Pain Location: generalized  Pain Descriptors / Indicators: Sore Pain Intervention(s): Monitored during session     Hand Dominance Right   Extremity/Trunk Assessment Upper Extremity Assessment Upper Extremity Assessment: Overall WFL for tasks assessed   Lower Extremity Assessment Lower Extremity Assessment: Defer to PT evaluation       Communication Communication Communication: No difficulties   Cognition Arousal/Alertness: Awake/alert Behavior During Therapy: WFL for tasks assessed/performed Overall Cognitive Status: Impaired/Different from baseline Area of Impairment: Attention                   Current Attention Level: Alternating           General Comments: pt with oriented, but tangential with difficulty following conversation    General Comments  Exercises     Shoulder Instructions      Home Living Family/patient expects to be discharged to:: Private residence Living Arrangements: Alone Available Help at Discharge: Friend(s);Available PRN/intermittently Type of Home: House Home Access: Stairs to enter Entrance Stairs-Number of  Steps: 5 & 4 Entrance Stairs-Rails: Right;Left Home Layout: Two level;Able to live on main level with bedroom/bathroom     Bathroom Shower/Tub: Teacher, early years/pre: Standard     Home Equipment: None          Prior Functioning/Environment Level of Independence: Independent                 OT Problem List: Decreased activity tolerance;Impaired balance (sitting and/or standing);Pain;Decreased cognition;Decreased knowledge of use of DME or AE      OT Treatment/Interventions: Self-care/ADL training;DME and/or AE instruction;Cognitive remediation/compensation;Patient/family education;Balance training    OT Goals(Current goals can be found in the care plan section) Acute Rehab OT Goals Patient Stated Goal: To return to independent OT Goal Formulation: With patient Time For Goal Achievement: 01/01/17 ADL Goals Pt Will Perform Grooming: with supervision;standing Pt Will Perform Upper Body Dressing: with supervision;sitting Pt Will Perform Lower Body Dressing: with supervision;sit to/from stand Pt Will Transfer to Toilet: with supervision;ambulating;regular height toilet Pt Will Perform Toileting - Clothing Manipulation and hygiene: with supervision;sit to/from stand Pt Will Perform Tub/Shower Transfer: Tub transfer;with supervision;ambulating;rolling walker  OT Frequency: Min 2X/week   Barriers to D/C:            Co-evaluation              AM-PAC PT "6 Clicks" Daily Activity     Outcome Measure Help from another person eating meals?: None Help from another person taking care of personal grooming?: A Little Help from another person toileting, which includes using toliet, bedpan, or urinal?: A Little Help from another person bathing (including washing, rinsing, drying)?: A Little Help from another person to put on and taking off regular upper body clothing?: None Help from another person to put on and taking off regular lower body clothing?: A  Little 6 Click Score: 20   End of Session Nurse Communication: (ok to give water)  Activity Tolerance: Patient tolerated treatment well Patient left: in bed;with call bell/phone within reach;with family/visitor present;with bed alarm set  OT Visit Diagnosis: Unsteadiness on feet (R26.81);Pain;Other symptoms and signs involving cognitive function                Time: 1324-4010 OT Time Calculation (min): 25 min Charges:  OT General Charges $OT Visit: 1 Visit OT Evaluation $OT Eval Moderate Complexity: 1 Mod OT Treatments $Self Care/Home Management : 8-22 mins G-Codes:     01/17/17 Nestor Lewandowsky, OTR/L Pager: [phone]  Werner Lean, Haze Boyden 01-17-2017, 4:24 PM

## 2016-12-18 NOTE — Progress Notes (Addendum)
Inpatient Diabetes Program Recommendations  AACE/ADA: New Consensus Statement on Inpatient Glycemic Control (2015)  Target Ranges:  Prepandial:   less than 140 mg/dL      Peak postprandial:   less than 180 mg/dL (1-2 hours)      Critically ill patients:  140 - 180 mg/dL   Results for DALLAS, TOROK (MRN [id number]) as of 12/18/2016 08:37  Ref. Range 12/16/2016 20:58 12/16/2016 23:23 12/17/2016 03:56 12/17/2016 07:17 12/18/2016 07:51  Glucose-Capillary Latest Ref Range: 65 - 99 mg/dL 219 (H) 150 (H) 89 115 (H) 136 (H)  Results for JAYMASON, LEDESMA (MRN [id number]) as of 12/18/2016 08:37  Ref. Range 12/17/2016 05:42  Hemoglobin A1C Latest Ref Range: 4.8 - 5.6 % 7.0 (H)   Review of Glycemic Control  Diabetes history: No Outpatient Diabetes medications: NA Current orders for Inpatient glycemic control: Novolog 0-9 units TID with meals  Inpatient Diabetes Program Recommendations: HgbA1C: A1C 7.0% on 12/17/16. Per Dr. Winfred Burn note today patient is being newly dx with DM. Ordered Living Well with Diabetes booklet, patient to watch DM education videos, and patient education by nursing. MD, please inform patient and nursing staff of new DM dx so patient can be educated by bedside nursing.  Thanks, Barnie Alderman, RN, MSN, CDE Diabetes Coordinator Inpatient Diabetes Program [phone] (Team Pager from 8am to 5pm)

## 2016-12-19 DIAGNOSIS — Z720 Tobacco use: Secondary | ICD-10-CM

## 2016-12-19 DIAGNOSIS — E782 Mixed hyperlipidemia: Secondary | ICD-10-CM

## 2016-12-19 LAB — BASIC METABOLIC PANEL
Anion gap: 8 (ref 5–15)
BUN: 16 mg/dL (ref 6–20)
CALCIUM: 8.6 mg/dL — AB (ref 8.9–10.3)
CO2: 26 mmol/L (ref 22–32)
CREATININE: 1.19 mg/dL (ref 0.61–1.24)
Chloride: 102 mmol/L (ref 101–111)
GFR calc Af Amer: >60 mL/min (ref >60)
GFR calc non Af Amer: 60 mL/min — ABNORMAL LOW (ref >60)
GLUCOSE: 135 mg/dL — AB (ref 65–99)
Potassium: 3.6 mmol/L (ref 3.5–5.1)
Sodium: 136 mmol/L (ref 135–145)

## 2016-12-19 LAB — GLUCOSE, CAPILLARY
Glucose-Capillary: 188 mg/dL — ABNORMAL HIGH (ref 65–99)
Glucose-Capillary: 125 mg/dL — ABNORMAL HIGH (ref 65–99)
Glucose-Capillary: 165 mg/dL — ABNORMAL HIGH (ref 65–99)

## 2016-12-19 MED ORDER — FUROSEMIDE 40 MG PO TABS: 40.0000 mg | ORAL_TABLET | Freq: Every day | ORAL | Status: DC

## 2016-12-19 MED ORDER — ASPIRIN 81 MG PO TBEC: 81.0000 mg | DELAYED_RELEASE_TABLET | Freq: Every day | ORAL | 11 refills | Status: AC

## 2016-12-19 MED ORDER — POTASSIUM CHLORIDE ER 20 MEQ PO TBCR: 20.0000 meq | EXTENDED_RELEASE_TABLET | Freq: Every day | ORAL | 6 refills | Status: DC

## 2016-12-19 MED ORDER — FUROSEMIDE 40 MG PO TABS: 40.0000 mg | ORAL_TABLET | Freq: Every day | ORAL | 6 refills | Status: DC

## 2016-12-19 MED ORDER — NITROGLYCERIN 0.4 MG SL SUBL: 0.4000 mg | SUBLINGUAL_TABLET | SUBLINGUAL | 12 refills | Status: DC | PRN

## 2016-12-19 MED ORDER — LISINOPRIL 5 MG PO TABS: 5.0000 mg | ORAL_TABLET | Freq: Every day | ORAL | 6 refills | Status: DC

## 2016-12-19 MED ORDER — METOPROLOL TARTRATE 25 MG PO TABS
25.0000 mg | ORAL_TABLET | Freq: Two times a day (BID) | ORAL | 11 refills | Status: DC
Start: 2016-12-19 — End: 2017-01-08

## 2016-12-19 MED ORDER — ATORVASTATIN CALCIUM 80 MG PO TABS: 80.0000 mg | ORAL_TABLET | Freq: Every day | ORAL | 11 refills | Status: DC

## 2016-12-19 NOTE — Progress Notes (Signed)
  RD consulted for nutrition education regarding diabetes.   Lab Results  Component Value Date   HGBA1C 7.0 (H) 12/17/2016    RD provided "Carbohydrate Counting for People with Diabetes" handout from the Academy of Nutrition and Dietetics. Discussed different food groups and their effects on blood sugar, emphasizing carbohydrate-containing foods. Provided list of carbohydrates and recommended serving sizes of common foods.  Discussed importance of controlled and consistent carbohydrate intake throughout the day. Provided examples of ways to balance meals/snacks and encouraged intake of high-fiber, whole grain complex carbohydrates. Teach back method used.  Expect fair compliance. Patient stated, "I am not comprehending all of this," referring to the education materials that have been provided by multidisciplinary team. He has not had much sleep and is having a hard time focusing on anything. He is just ready to go home.  Body mass index is 29.28 kg/m. Pt meets criteria for overweight based on current BMI.  Current diet order is heart healthy CHO modified, patient is consuming approximately 100% of meals at this time. Labs and medications reviewed. No further nutrition interventions warranted at this time. RD contact information provided. If additional nutrition issues arise, please re-consult RD.  Molli Barrows, RD, LDN, [place] Pager [phone] After Hours Pager [phone]

## 2016-12-19 NOTE — Progress Notes (Signed)
Reviewed discharge paperwork with pt and went over new meds and prescriptions. IV's removed without complaint. No further questions from pt. Traveling home with friend Cyndie Chime.

## 2016-12-19 NOTE — Progress Notes (Signed)
Physical Therapy Treatment Patient Details Name: Luis Donaldson MRN: [id number] DOB: Sep 04, 1945 Today's Date: 12/19/2016    History of Present Illness 71 y.o. male with PMH of CABG in 2000 at [facility] presented to [facility] with chest pain, volume overload, hypertensive emergency, persistent runs of NSVT and what was thought to be STEMI; he had not followed up with medical care and been without medicines for some time. He underwent emergent heart cath which did not reveal culprit lesion; it showed 2 patent grafts and chronic RCA graft occlusion with collaterals.     PT Comments    Progressing well.  Still a little unsteady.  Pt will benefit from [facility] inpatient for the education reinforcement, HHPT after d/c to prepare for CRP2.   Follow Up Recommendations  Other (comment)(transition to CRP2 as appropriate)     Equipment Recommendations  None recommended by PT    Recommendations for Other Services       Precautions / Restrictions Precautions Precautions: Fall Restrictions RLE Weight Bearing: Weight bearing as tolerated    Mobility  Bed Mobility Overal bed mobility: Modified Independent                Transfers Overall transfer level: Needs assistance   Transfers: Sit to/from Stand Sit to Stand: Supervision         General transfer comment: cues for technique, increased time  Ambulation/Gait Ambulation/Gait assistance: Min guard;Supervision Ambulation Distance (Feet): 400 Feet   Gait Pattern/deviations: Step-through pattern Gait velocity: preferred slower, but could noticeably increase speed Gait velocity interpretation: at or above normal speed for age/gender General Gait Details: short, increasingly antalgic steps; guarded though generally steady with mild deviation with scanning or abrupt turns.   Stairs Stairs: Yes   Stair Management: One rail Right;Step to pattern;Forwards Number of Stairs: 3 General stair comments: cues for less painful technique.  safe  with use of the rail  Wheelchair Mobility    Modified Rankin (Stroke Patients Only)       Balance     Sitting balance-Leahy Scale: Good       Standing balance-Leahy Scale: Good                   Standardized Balance Assessment Standardized Balance Assessment : Dynamic Gait Index   Dynamic Gait Index Level Surface: Normal Change in Gait Speed: Mild Impairment Gait with Horizontal Head Turns: Mild Impairment Gait with Vertical Head Turns: Normal Gait and Pivot Turn: Normal Step Over Obstacle: Mild Impairment Step Around Obstacles: Mild Impairment Steps: Mild Impairment Total Score: 19      Cognition Arousal/Alertness: Awake/alert Behavior During Therapy: WFL for tasks assessed/performed Overall Cognitive Status: Within Functional Limits for tasks assessed(distractable)                                        Exercises      General Comments        Pertinent Vitals/Pain Faces Pain Scale: Hurts little more Pain Location: L i/o site Pain Descriptors / Indicators: Sore    Home Living                      Prior Function            PT Goals (current goals can now be found in the care plan section) Acute Rehab PT Goals Patient Stated Goal: To return to independent PT Goal Formulation: With  patient Time For Goal Achievement: 12/25/16 Potential to Achieve Goals: Good Progress towards PT goals: Progressing toward goals    Frequency    Min 3X/week      PT Plan Current plan remains appropriate    Co-evaluation              AM-PAC PT "6 Clicks" Daily Activity  Outcome Measure  Difficulty turning over in bed (including adjusting bedclothes, sheets and blankets)?: None Difficulty moving from lying on back to sitting on the side of the bed? : None Difficulty sitting down on and standing up from a chair with arms (e.g., wheelchair, bedside commode, etc,.)?: None Help needed moving to and from a bed to chair  (including a wheelchair)?: A Little Help needed walking in hospital room?: A Little Help needed climbing 3-5 steps with a railing? : A Little 6 Click Score: 21    End of Session   Activity Tolerance: Patient tolerated treatment well Patient left: in bed;with call bell/phone within reach Nurse Communication: Mobility status PT Visit Diagnosis: Unsteadiness on feet (R26.81) Pain - Right/Left: Left Pain - part of body: Leg     Time: 6213-0865 PT Time Calculation (min) (ACUTE ONLY): 19 min  Charges:  $Gait Training: 8-22 mins                    G Codes:       12/24/2016  Luis Donaldson, PT [phone]  (pager)   Luis Donaldson 12-24-16, 12:27 PM

## 2016-12-19 NOTE — Care Management (Signed)
1023 12-19-16 Pt will be able to utilize the [facility] for one time fill. Pt will need to get established with the clinic thereafter. Pt will need paper Rx or e-scribed to clinic. No further needs from CM at this time. Bethena Roys, RN,BSN [phone]

## 2016-12-19 NOTE — Progress Notes (Signed)
Inpatient Diabetes Program Recommendations  AACE/ADA: New Consensus Statement on Inpatient Glycemic Control (2015)  Target Ranges:  Prepandial:   less than 140 mg/dL      Peak postprandial:   less than 180 mg/dL (1-2 hours)      Critically ill patients:  140 - 180 mg/dL   Results for STEED, KANAAN (MRN [id number]) as of 12/19/2016 11:35  Ref. Range 12/16/2016 23:02 12/17/2016 05:42  Hemoglobin A1C Latest Ref Range: 4.8 - 5.6 % 7.0 (H) 7.0 (H)   Review of Glycemic Control  Diabetes history: No Outpatient Diabetes medications: None Current orders for Inpatient glycemic control: Novolog 0-9 units TID with meals  Inpatient Diabetes Program Recommendations:  HgbA1C: A1C 7.0% on 12/17/16 and MD, is dx patient with new DM. Patient will need to follow up regarding DM control.  NOTE: Spoke with patient over the phone (DM coordinator working from [employer] today) about new diabetes diagnosis.  Discussed A1C results (7.0%) and explained what an A1C is and informed patient that his current A1C indicates an average glucose of 154 mg/dl over the past 2-3 months. Talked with patient briefly about DM, importance of control, goal A1C and glucose ranges.  Patient states that staff and doctors have already been talking with him about DM and he has Living Well with Diabetes booklet at bedside. Patient states that he has not looked at book yet but he will when he gets home and after he can get some rest. Patient stated that he is going to be discharged and has someone there to take him home and also RD just came in the room. Asked patient to let RD know he was talking with DM Coordinator and see if they would come back shortly. Patient stated that he really did not have time to talk with me because he had people waiting for him. Explained importance of DM education and patient stated he plans to make changes with his diet.  Inquired about follow up and patient stated he did not know what he was going to do  for follow up. Explained that CM had a note in the chart that they would see if they could get him an appointment at the [facility] and [facility]. Patient states that he is not aware of that as he has been "out of it" since he has been here at the hospital.  Stressed to patient he will  need to establish follow up care and follow up regarding DM control.   Patient again stated he needed to get off the phone now. Encouraged patient again to be sure to read the Living Well with Diabetes booklet, to ask clinic about outpatient DM education, and follow up with a doctor. Patient verbalized understanding of information discussed.   Thanks, Barnie Alderman, RN, MSN, CDE Diabetes Coordinator Inpatient Diabetes Program [phone] (Team Pager from 8am to 5pm)

## 2016-12-19 NOTE — Progress Notes (Signed)
  CARDIAC REHAB PHASE I   Pt just ambulated with PT, awaiting discharge. Completed MI education with pt and friends at bedside.  Reviewed risk factors, tobacco cessation (gave pt fake cigarette), MI book, activity restrictions, ntg, exercise, heart healthy and diabetes diet handouts, low sodium diet, CHF booklet and zone tool, daily weights and phase 2 cardiac rehab. Pt verbalized understanding. Pt agrees to phase 2 cardiac rehab referral, will send to [facility]. Pt up ad lib, awaiting discharge.    [id number] Lenna Sciara, RN, BSN 12/19/2016 11:35 AM

## 2016-12-19 NOTE — Progress Notes (Signed)
Pt demonstrated ability to perform ADL and ADL mobility modified independently. Cognition WFL for ADL.   12/19/16 1200  OT Visit Information  Last OT Received On 12/19/16  Assistance Needed +1  History of Present Illness 71 y.o. male with PMH of CABG in 2000 at [facility] presented to [facility] with chest pain, volume overload, hypertensive emergency, persistent runs of NSVT and what was thought to be STEMI; he had not followed up with medical care and been without medicines for some time. He underwent emergent heart cath which did not reveal culprit lesion; it showed 2 patent grafts and chronic RCA graft occlusion with collaterals.   Precautions  Precautions Fall  Pain Assessment  Pain Assessment No/denies pain  Cognition  Arousal/Alertness Awake/alert  Behavior During Therapy WFL for tasks assessed/performed  Overall Cognitive Status Within Functional Limits for tasks assessed (distractable)  ADL  Overall ADL's  Modified independent  Bed Mobility  Overal bed mobility Modified Independent  Transfers  Overall transfer level Modified independent  OT - End of Session  Activity Tolerance Patient tolerated treatment well  Patient left in bed;with call bell/phone within reach  OT Assessment/Plan  OT Plan Discharge plan needs to be updated  OT Visit Diagnosis Other abnormalities of gait and mobility (R26.89)  Follow Up Recommendations No OT follow up  OT Equipment None recommended by OT  AM-PAC OT "6 Clicks" Daily Activity Outcome Measure  Help from another person eating meals? 4  Help from another person taking care of personal grooming? 4  Help from another person toileting, which includes using toliet, bedpan, or urinal? 4  Help from another person bathing (including washing, rinsing, drying)? 4  Help from another person to put on and taking off regular upper body clothing? 4  Help from another person to put on and taking off regular lower body clothing? 4  6 Click Score 24  ADL G Code  Conversion CH  OT Goal Progression  Progress towards OT goals Goals met/education completed, patient discharged from OT  Acute Rehab OT Goals  Patient Stated Goal To return to independent  OT Time Calculation  OT Start Time (ACUTE ONLY) 1156  OT Stop Time (ACUTE ONLY) 1230  OT Time Calculation (min) 34 min  OT General Charges  $OT Visit 1 Visit  OT Treatments  $Self Care/Home Management  23-37 mins  12/19/2016 Nestor Lewandowsky, OTR/L Pager: [phone]

## 2016-12-19 NOTE — Progress Notes (Signed)
Progress Note  Patient Name: Luis Donaldson Date of Encounter: 12/19/2016  Primary Cardiologist: new, [place]  Subjective   Patient states he still feels overall weak; he denies chest pain, shortness of breath, palpitations, dizziness; he was able to ambulate with PT yesterday without symptoms.   Inpatient Medications    Scheduled Meds: . aspirin EC  81 mg Oral Daily  . atorvastatin  80 mg Oral q1800  . insulin aspart  0-9 Units Subcutaneous TID WC  . lisinopril  5 mg Oral Daily  . sodium chloride flush  3 mL Intravenous Q12H   Continuous Infusions: . sodium chloride 10 mL/hr at 12/16/16 2109  . sodium chloride     PRN Meds: sodium chloride, acetaminophen, ipratropium-albuterol, nitroGLYCERIN, ondansetron (ZOFRAN) IV, sodium chloride flush   Vital Signs    Vitals:   12/18/16 1400 12/18/16 2118 12/19/16 0445 12/19/16 0818  BP: (!) 118/46 (!) 90/50 (!) 146/69 (!) 150/90  Pulse: (!) 35 (!) 38 (!) 34   Resp: (!) 21 19 20    Temp:  98.4 F (36.9 C) 98.1 F (36.7 C)   TempSrc:  Oral Oral   SpO2: 90% 96% 95%   Weight:   198 lb 4.8 oz (89.9 kg)   Height:        Intake/Output Summary (Last 24 hours) at 12/19/2016 0840 Last data filed at 12/19/2016 0453 Gross per 24 hour  Intake 975 ml  Output 870 ml  Net 105 ml   Filed Weights   12/17/16 0452 12/18/16 0635 12/19/16 0445  Weight: 201 lb 1 oz (91.2 kg) 196 lb 11.2 oz (89.2 kg) 198 lb 4.8 oz (89.9 kg)    Telemetry    SR, vent bigeminy, a couple of runs of NSVT last night - Personally Reviewed, 12/19/2016  ECG    SR, 1st degree AV block, q wave in III, avF; poor R wave progression - Personally Reviewed, 12/19/2016  Physical Exam   GEN: No acute distress, sitting in recliner.   Neck: No JVD Cardiac: RRR, no murmurs, rubs, or gallops.  Respiratory: CTAB, no increased work of breathing. GI: Soft, nontender, non-distended  MS: No edema. Neuro:  Nonfocal  Psych: Normal affect   Labs    Chemistry Recent Labs    Lab 12/16/16 1734  12/17/16 0629 12/17/16 1042 12/18/16 0412 12/19/16 0308  NA 135   < > 137 136 138 136  K 4.3   < > 4.1 4.0 3.2* 3.6  CL 101   < > 100* 103 101 102  CO2 19*  --  26 21* 28 26  GLUCOSE 219*   < > 116* 151* 158* 135*  BUN 9   < > 11 12 16 16   CREATININE 1.43*   < > 1.42* 1.28* 1.42* 1.19  CALCIUM 9.1  --  8.5* 8.8* 8.7* 8.6*  PROT 7.6  --   --   --   --   --   ALBUMIN 4.4  --  3.5  --   --   --   AST 31  --   --   --   --   --   ALT 33  --   --   --   --   --   ALKPHOS 126  --   --   --   --   --   BILITOT 1.2  --   --   --   --   --   GFRNONAA 48*  --  48* 55*  48* 60*  GFRAA 55*  --  56* >60 56* >60  ANIONGAP 15  --  11 12 9 8    < > = values in this interval not displayed.     Hematology Recent Labs  Lab 12/16/16 1734 12/16/16 1848 12/17/16 0629 12/18/16 0412  WBC 11.6*  --  8.9 9.3  RBC 5.75  --  4.98 4.97  HGB 18.4* 17.3* 15.9 15.6  HCT 55.1* 51.0 47.1 46.6  MCV 95.8  --  94.6 93.8  MCH 32.0  --  31.9 31.4  MCHC 33.4  --  33.8 33.5  RDW 13.5  --  13.8 13.6  PLT 153  --  131* 114*    Cardiac Enzymes Recent Labs  Lab 12/16/16 1734 12/16/16 2302 12/17/16 0629 12/17/16 0916  TROPONINI 0.05* 4.99* 11.89* 9.94*   No results for input(s): TROPIPOC in the last 168 hours.   BNP Recent Labs  Lab 12/16/16 2302  BNP 605.8*     DDimer No results for input(s): DDIMER in the last 168 hours.   Radiology    Dg Chest Port 1 View  Result Date: 12/18/2016 CLINICAL DATA:  71 year old male status post STEMI. Status post intubation and cardiac catheterization. EXAM: PORTABLE CHEST 1 VIEW COMPARISON:  12/17/2016 and earlier. FINDINGS: Portable AP semi upright view at 0527 hours. Extubated. Enteric tube removed. Stable lung volumes. Stable cardiac size and mediastinal contours. No pneumothorax. Further decreased pulmonary vascularity improved left lung base ventilation. Mild nonspecific patchy opacity at the right lung base. No definite pleural  effusion. IMPRESSION: 1. Extubated and enteric tube removed.  Stable lung volumes. 2. Further regression of pulmonary vascularity, and improved left lung base ventilation. 3. Mild patchy opacity at the right lung base, favor atelectasis. Electronically Signed   By: Genevie Ann M.D.   On: 12/18/2016 08:37    Cardiac Studies   LHC 12/16/2016: 1.  Severe 3 vessel occlusive CAD 2.  Patent LIMA to the LAD 3.  Patent free radial graft to the OM1 4.  Occluded SVG to the RCA. This appears chronic.  5.  The RCA and first diagonal are filled by collaterals 6.  Severe LV dysfunction. EF estimated at 25-30% 7.  Markedly elevated LVEDP. Plan: I am unable to identify any culprit lesion. The LAD and LCx territories are well revascularized. The SVG to the RCA is occluded but this appears chronic and the native RCA also has a chronic occlusion and is well collateralized. The first diagonal is small and also has collaterals. I doubt this was grafted and no other grafts identified. I think his presentation was due to decompensated CHF with arrhythmia and Hypertensive urgency. Will admit to ICU. Consult CCM for management of vent. Continue IV Ntg and diurese with IV lasix. Will resume IV heparin until cardiac enzymes are cycled. Check Echo.  Echo 12/17/2016: - Left ventricle: The cavity size was normal. Systolic function was   severely reduced. The estimated ejection fraction was in the   range of 25% to 30%. Anterior wall, apical and mid to distal   inferoapical thinning and akinesis suggestive of extensive LAD   territory infarct, basal to mid inferior wall and lateral wall   hypokinesis. Doppler parameters are consistent with abnormal left   ventricular relaxation (grade 1 diastolic dysfunction). LV   filling pressure appears elevated (b-bump noted on mitral   m-mode). - Aortic valve: Sclerosis without stenosis. There was trivial   regurgitation. - Mitral valve: Mildly thickened leaflets . Systolic bowing  without   prolapse. There was mild regurgitation. - Left atrium: The atrium was normal in size. - Right ventricle: The cavity size was normal. Mild hypokinesis. - Systemic veins: The IVC measures <2.1 cm, but does not collapse   >50%, suggesting an elevated RA pressure of 8 mmHg.  Patient Profile     71 y.o. male with PMH of CABG in 200 at [facility] presented to [facility] with chest pain, volume overload, hypertensive emergency, persistent runs of NSVT and what was thought to be STEMI; he had not followed up with medical care and been without medicines for some time. He underwent emergent heart cath which did not reveal culprit lesion; it showed 2 patent grafts and chronic RCA graft occlusion with collaterals.   Assessment & Plan    NSTEMI CAD: Cath with no culprit lesion found; event thought to be product of decompensated CHF in combination with hypertensive emergency and VT. Trop peaked at 12. Echo shows apical and mid-distal inferoapical thinning and akinesis suggestive of extensive LAD territory infarct. --asa --atorvastatin 80mg  daily --HR limiting starting BB at this time  NSVtach: Currently in SR with still significant but less frequent PVCs and bigeminy. He is asymptomatic.  Acute CHF exacerbation: EF 25-30% with elevated biventricular filling pressures. Weight down 3kgs since admission. He appears euvolemic today. --daily weight, ins/outs --lisinopril --HR limiting starting BB  HTN: HTNsive this AM but has soft BPs yesterday afternoon and evening; will hold increasing antihypertensive until it appears BPs have stabilized. --lisinopril 5mg  daily; Cr 1.19 today.  Newly diagnosed T2DM: A1c 7. --SSI while inpatient.  Care management consulted for medication assistance, patient to go to [facility]. PT/OT rec HH. Appreciate CM assistance.   For questions or updates, please contact [place] Please consult [email or web address] for contact info under Cardiology/STEMI.  Signed, Alphonzo Grieve, MD  12/19/2016, 8:40 AM    I have examined the patient and reviewed assessment and plan and discussed with patient.  Agree with above as stated.  Patient feels well and wants to go home. HR was slow while asleep but high while awake.  Given PVCs and short runs of NSVT, will add metoprolol 25 mg BID.  Discharge on lasix 40 mg daily.  Will also use lisinopril 5 mg daily to help with LV dysfunction.  Once he is established in the clinic, could consider Entresto.  He has had compliance issues documented in the past with other MDs.   He will need f/u in the office   [facility]

## 2016-12-19 NOTE — Discharge Summary (Signed)
Discharge Summary    Patient ID: Luis Donaldson,  MRN: [id number], DOB/AGE: 07-18-1945 71 y.o.  Admit date: 12/16/2016 Discharge date: 12/19/2016  Primary Care Provider: Benito Mccreedy Primary Cardiologist: New to Dr. Martinique   Discharge Diagnoses    Principal Problem:   STEMI (ST elevation myocardial infarction) [facility]) Active Problems:   NSVT (nonsustained ventricular tachycardia) (HCC)   Acute systolic heart failure (HCC)   Respiratory failure ([place])   CAD (coronary artery disease)   Hx of CABG   HLD (hyperlipidemia)   Tobacco use   Acute systolic CHF (congestive heart failure) (HCC)   Paroxysmal ventricular tachycardia (HCC)   Allergies No Known Allergies  Diagnostic Studies/Procedures    LHC 12/16/2016: 1. Severe 3 vessel occlusive CAD 2. Patent LIMA to the LAD 3. Patent free radial graft to the OM1 4. Occluded SVG to the RCA. This appears chronic.  5. The RCA and first diagonal are filled by collaterals 6. Severe LV dysfunction. EF estimated at 25-30% 7. Markedly elevated LVEDP. Plan: I am unable to identify any culprit lesion. The LAD and LCx territories are well revascularized. The SVG to the RCA is occluded but this appears chronic and the native RCA also has a chronic occlusion and is well collateralized. The first diagonal is small and also has collaterals. I doubt this was grafted and no other grafts identified. I think his presentation was due to decompensated CHF with arrhythmia and Hypertensive urgency. Will admit to ICU. Consult CCM for management of vent. Continue IV Ntg and diurese with IV lasix. Will resume IV heparin until cardiac enzymes are cycled. Check Echo.  - Peter Martinique, MD  Echo 12/17/2016: - Left ventricle: The cavity size was normal. Systolic function was severely reduced. The estimated ejection fraction was in the range of 25% to 30%. Anterior wall, apical and mid to distal inferoapical thinning and akinesis suggestive  of extensive LAD territory infarct, basal to mid inferior wall and lateral wall hypokinesis. Doppler parameters are consistent with abnormal left ventricular relaxation (grade 1 diastolic dysfunction). LV filling pressure appears elevated (b-bump noted on mitral m-mode). - Aortic valve: Sclerosis without stenosis. There was trivial regurgitation. - Mitral valve: Mildly thickened leaflets . Systolic bowing without prolapse. There was mild regurgitation. - Left atrium: The atrium was normal in size. - Right ventricle: The cavity size was normal. Mild hypokinesis. - Systemic veins: The IVC measures <2.1 cm, but does not collapse >50%, suggesting an elevated RA pressure of 8 mmHg.   History of Present Illness     71 y.o. male with PMH of CABG in 2000 at [facility] presented to [facility] with chest pain, volume overload, hypertensive emergency, persistent runs of NSVT and what was thought to be STEMI; he had not followed up with medical care and been without medicines for some time. History of tobacco abuse, HTN, HLD all untreated. No other medical history known.  Presents to ED today with complaint of chest pain and acute dyspnea. Found to be severely hypertensive. Having repetitive and incessant runs of ventricular tachycardia. Ecg difficult to interpret due to ventricular ectopy but there is possible mild ST elevation in leads V1-2 with ST depression in lateral leads. In ED patient's respiratory status decompensated and he required intubation with mechanical ventilation. With unstable status and uncertainty concerning possible ACS urgent cardiac cath was recommended. No family available.   He underwent emergent heart cath which did not reveal culprit lesion; it showed 2 patent grafts and chronic RCA graft occlusion  with collaterals.   Hospital Course     Consultants: PCCM  NSTEMI/CAD: - He underwent emergent heart cath which did not reveal culprit lesion; it showed 2 patent grafts  and chronic RCA graft occlusion with collaterals. Event thought to be product of decompensated CHF in combination with hypertensive emergency and VT. Trop peaked at 12. Echo shows apical and mid-distal inferoapical thinning and akinesis suggestive of extensive LAD territory infarct.--> plan to manage medically.  - Continue ASA and statin.   NSVtach: - Currently in SR with still significant but less frequent PVCs and bigeminy. He is asymptomatic. HR was slow while asleep but high while awake.  Given PVCs and short runs of NSVT, will add metoprolol 25 mg BID.    Acute Combined CHF exacerbation: EF 25-30% with elevated biventricular filling pressures. Net I & O negative 1.9L. Weight down 5 lb (20300>198lb). He appears euvolemic. ADD lasix and supplemental K.  - Continue ACE and BB.  - Once he is established in the clinic, could consider Entresto.     HTN: -- Initially hypertensive then had soft blood pressure; will hold increasing antihypertensive until it appears BPs have stabilized. Continue ACE. Add BB at discharge. Follow closely.   Newly diagnosed T2DM: A1c 7. --SSI while inpatient. -- Follow up in [place] --12/17/2016: Cholesterol 202; HDL 31; LDL Cholesterol 125; Triglycerides 231; VLDL 46  -- Continue Lipitor 80mg . Consider OP f/u labs 6-8 weeks given statin initiation this admission.  Care management consulted for medication assistance, patient to go to [facility]. PT/OT rec HH.    The patient has been seen by Dr. Irish Lack today and deemed ready for discharge home. All follow-up appointments have been scheduled. Discharge medications are listed below.    Discharge Vitals Blood pressure (!) 150/90, pulse (!) 34, temperature 98.1 F (36.7 C), temperature source Oral, resp. rate 20, height 5\' 9"  (1.753 m), weight 198 lb 4.8 oz (89.9 kg), SpO2 95 %.  Filed Weights   12/17/16 0452 12/18/16 0635 12/19/16 0445  Weight: 201 lb 1 oz (91.2 kg) 196 lb 11.2 oz (89.2 kg) 198  lb 4.8 oz (89.9 kg)    Labs & Radiologic Studies     CBC Recent Labs    12/16/16 1734  12/17/16 0629 12/18/16 0412  WBC 11.6*  --  8.9 9.3  NEUTROABS 9.4*  --  5.6  --   HGB 18.4*   < > 15.9 15.6  HCT 55.1*   < > 47.1 46.6  MCV 95.8  --  94.6 93.8  PLT 153  --  131* 114*   < > = values in this interval not displayed.   Basic Metabolic Panel Recent Labs    12/16/16 2302 12/17/16 0629  12/17/16 1042 12/18/16 0412 12/19/16 0308  NA  --  137  --  136 138 136  K  --  4.1  --  4.0 3.2* 3.6  CL  --  100*  --  103 101 102  CO2  --  26  --  21* 28 26  GLUCOSE  --  116*  --  151* 158* 135*  BUN  --  11  --  12 16 16   CREATININE  --  1.42*  --  1.28* 1.42* 1.19  CALCIUM  --  8.5*  --  8.8* 8.7* 8.6*  MG 2.3 2.1  --   --   --   --   PHOS  --  4.4   < > 3.5 3.8  --    < > =  values in this interval not displayed.   Liver Function Tests Recent Labs    12/16/16 1734 12/17/16 0629  AST 31  --   ALT 33  --   ALKPHOS 126  --   BILITOT 1.2  --   PROT 7.6  --   ALBUMIN 4.4 3.5   No results for input(s): LIPASE, AMYLASE in the last 72 hours. Cardiac Enzymes Recent Labs    12/16/16 2302 12/17/16 0629 12/17/16 0916  TROPONINI 4.99* 11.89* 9.94*   Hemoglobin A1C Recent Labs    12/17/16 0542  HGBA1C 7.0*   Fasting Lipid Panel Recent Labs    12/17/16 0629  CHOL 202*  HDL 31*  LDLCALC 125*  TRIG 231*  CHOLHDL 6.5   Thyroid Function Tests Recent Labs    12/16/16 2311  TSH 1.036    Dg Chest Port 1 View  Result Date: 12/18/2016 CLINICAL DATA:  71 year old male status post STEMI. Status post intubation and cardiac catheterization. EXAM: PORTABLE CHEST 1 VIEW COMPARISON:  12/17/2016 and earlier. FINDINGS: Portable AP semi upright view at 0527 hours. Extubated. Enteric tube removed. Stable lung volumes. Stable cardiac size and mediastinal contours. No pneumothorax. Further decreased pulmonary vascularity improved left lung base ventilation. Mild nonspecific patchy  opacity at the right lung base. No definite pleural effusion. IMPRESSION: 1. Extubated and enteric tube removed.  Stable lung volumes. 2. Further regression of pulmonary vascularity, and improved left lung base ventilation. 3. Mild patchy opacity at the right lung base, favor atelectasis. Electronically Signed   By: Genevie Ann M.D.   On: 12/18/2016 08:37   Portable Chest Xray  Result Date: 12/17/2016 CLINICAL DATA:  Intubation. EXAM: PORTABLE CHEST 1 VIEW COMPARISON:  12/16/2016. FINDINGS: Endotracheal tube and NG tube in stable position. Prior median sternotomy. Cardiomegaly with normal pulmonary vascularity. Interim improvement of interstitial prominence suggesting improving CHF. No prominent pleural effusion. Costophrenic angles incompletely imaged. No pneumothorax identified. IMPRESSION: 1.  Endotracheal tube and NG tube in stable position. 2. Prior CABG. Stable cardiomegaly. Interim improvement of interstitial prominence suggesting improving CHF. Electronically Signed   By: Marcello Moores  Register   On: 12/17/2016 08:42   Dg Chest Port 1 View  Result Date: 12/16/2016 CLINICAL DATA:  Endotracheal tube placement. EXAM: PORTABLE CHEST 1 VIEW COMPARISON:  None. FINDINGS: Mild cardiomegaly is noted. Endotracheal tube is seen projected over tracheal air shadow with distal tip 7 cm above the carina. Nasogastric tube is seen entering the stomach. No pneumothorax or pleural effusion is noted. Mild central pulmonary vascular congestion and possible perihilar edema is noted. Bony thorax is unremarkable. IMPRESSION: Endotracheal and nasogastric tubes in grossly good position. Mild cardiomegaly with central pulmonary vascular congestion is noted. Possible bilateral perihilar edema. Electronically Signed   By: Marijo Conception, M.D.   On: 12/16/2016 18:24    Disposition   Pt is being discharged home today in good condition.  Follow-up Plans & Appointments    Follow-up Information    [place] Follow up.   Why:  call on Monday at 8:30 am to make hospital follow up apt- Please utilize this location for Rx assistance with Medications 12-19-16 Contact information: [place] [id number] [phone]       [facility] Follow up.   Contact information: [place] [id number] [place] Follow up.   Contact information: [place]  [place] [address]       Erlene Quan, PA-C. Go on 12/25/2016.   Specialties:  Cardiology, Radiology Why:  @9  am for TCM hospital follow up - Cardiology Contact information: [address] [place] [address] [phone]          Discharge Instructions    Diet - low sodium heart healthy   Complete by:  As directed    Discharge instructions   Complete by:  As directed    NO HEAVY LIFTING (>10lbs) X 2 WEEKS. NO SEXUAL ACTIVITY X 2 WEEKS. NO DRIVING X 1 WEEK. NO SOAKING BATHS, HOT TUBS, POOLS, ETC., X 7 DAYS.  No return to work until seen in [facility] for follow up.   Increase activity slowly   Complete by:  As directed       Discharge Medications      Aspirin prescribed at discharge?  Yes High Intensity Statin Prescribed? (Lipitor 40-80mg  or Crestor 20-40mg ): Yes Beta Blocker Prescribed? Yes For EF 45% or less, Was ACEI/ARB Prescribed? Yes ADP Receptor Inhibitor Prescribed? (i.e. Plavix etc.-Includes Medically Managed Patients): N/A - no PCI For EF <40%, Aldosterone Inhibitor Prescribed? May consider as outpatient  Was EF assessed during THIS hospitalization? Yes Was Cardiac Rehab II ordered? (Included Medically managed Patients): N/A   Outstanding Labs/Studies   Consider OP f/u labs 6-8 weeks given statin initiation this admission. BMET during follow up  Duration of Discharge Encounter   Greater than 30 minutes including physician  time.  Signed, Crista Luria Bhagat PA-C 12/19/2016, 10:26 AM   I have examined the patient and reviewed assessment and plan and discussed with patient.  Agree with above as stated.  Patient feels well and wants to go home. HR was slow while asleep but high while awake.  Given PVCs and short runs of NSVT, will add metoprolol 25 mg BID.  Discharge on lasix 40 mg daily.  Will also use lisinopril 5 mg daily to help with LV dysfunction.  Once he is established in the clinic, could consider Entresto.  He has had compliance issues documented in the past with other MDs.   He will need f/u in the office   [facility]

## 2016-12-20 MED FILL — NITROSTAT 0.4 MG TABLET SL: 0.4 | 25 days supply | Qty: 25 | Fill #0 | Status: TO

## 2016-12-20 MED FILL — POTASSIUM CL ER 20 MEQ TAB: 20 | 30 days supply | Qty: 30 | Fill #0 | Status: TO

## 2016-12-20 MED FILL — ?FUROSEMIDE 40 MG TABLET: 40 | 30 days supply | Qty: 30 | Fill #0 | Status: TO

## 2016-12-20 MED FILL — METOPROLOL TARTRATE 25 MG T: 25 | 30 days supply | Qty: 60 | Fill #0 | Status: TO

## 2016-12-20 MED FILL — ATORVASTATIN 80 MG TABLET: 80 | 30 days supply | Qty: 30 | Fill #0 | Status: TO

## 2016-12-23 ENCOUNTER — Telehealth (HOSPITAL_COMMUNITY): Payer: Self-pay

## 2016-12-23 NOTE — Telephone Encounter (Signed)
Patient's insurance is active and benefits verified through Medicare Part A & B - No co-pay, deductible amount of $183.00/$183.00 has been met, no out of pocket, 20% co-insurance, and no pre-authorization is required. Passport/reference [id number]  Patient will be contacted and scheduled after their follow up appt with the Cardiologist office upon review by the RN Navigator.

## 2016-12-24 ENCOUNTER — Telehealth: Payer: Self-pay

## 2016-12-24 NOTE — Telephone Encounter (Signed)
Call placed to the patient to inform him that there is a hospital follow up appointment available at [facility] tomorrow.  He stated that he has an appointment tomorrow with cardiovascular clinic. He is not sure about scheduling anything else at this time. He said that he does not have a PCP at this time and he would prefer to find someone who is closer to where he lives so he would not have to come to [facility].  Informed him that he can call this clinic at anytime if he changes his mind. Stressed with him the importance of keeping his cardiovascular appointment tomorrow.  He also noted that he called [facility] because he did not hear from them about home therapy. He said that he is waiting for a call back.  Encouraged him to call [facility] again if he doesn't hear back.

## 2016-12-25 ENCOUNTER — Ambulatory Visit (INDEPENDENT_AMBULATORY_CARE_PROVIDER_SITE_OTHER): Payer: Medicare Other | Admitting: Cardiology

## 2016-12-25 ENCOUNTER — Encounter: Payer: Self-pay | Admitting: Cardiology

## 2016-12-25 VITALS — BP 122/68 | HR 89 | Ht 69.0 in | Wt 203.0 lb

## 2016-12-25 DIAGNOSIS — I472 Ventricular tachycardia: Secondary | ICD-10-CM

## 2016-12-25 DIAGNOSIS — I4729 Other ventricular tachycardia: Secondary | ICD-10-CM

## 2016-12-25 DIAGNOSIS — I213 ST elevation (STEMI) myocardial infarction of unspecified site: Secondary | ICD-10-CM | POA: Diagnosis not present

## 2016-12-25 DIAGNOSIS — I5021 Acute systolic (congestive) heart failure: Secondary | ICD-10-CM

## 2016-12-25 DIAGNOSIS — E782 Mixed hyperlipidemia: Secondary | ICD-10-CM

## 2016-12-25 DIAGNOSIS — I25110 Atherosclerotic heart disease of native coronary artery with unstable angina pectoris: Secondary | ICD-10-CM | POA: Diagnosis not present

## 2016-12-25 LAB — BASIC METABOLIC PANEL
BUN/Creatinine Ratio: 20 (ref 10–24)
BUN: 24 mg/dL (ref 8–27)
CO2: 20 mmol/L (ref 20–29)
Calcium: 8.8 mg/dL (ref 8.6–10.2)
Chloride: 103 mmol/L (ref 96–106)
Creatinine, Ser: 1.23 mg/dL (ref 0.76–1.27)
GFR calc Af Amer: 68 mL/min/{1.73_m2} (ref >59)
GFR calc non Af Amer: 59 mL/min/{1.73_m2} — ABNORMAL LOW (ref >59)
Glucose: 161 mg/dL — ABNORMAL HIGH (ref 65–99)
Potassium: 4.9 mmol/L (ref 3.5–5.2)
Sodium: 137 mmol/L (ref 134–144)

## 2016-12-25 NOTE — Assessment & Plan Note (Signed)
Presented 12/16/16 with chest pain with ST elevation V1-V2, respiratory failure,accelerated HTN, and NSTVT

## 2016-12-25 NOTE — Assessment & Plan Note (Signed)
PVCs today- he is on beta blocker

## 2016-12-25 NOTE — Progress Notes (Signed)
Bmp

## 2016-12-25 NOTE — Assessment & Plan Note (Addendum)
Remote PCI in the 90's, CABG inn 2000 at [facility]. - occluded SVG to RCA with L-R collaterals, patent free radial to OM with 60% distal OM1,  Patent LIMA to LAD. Plan is for medical Rx (no PCI done)

## 2016-12-25 NOTE — Assessment & Plan Note (Signed)
High dose statin Rx added 

## 2016-12-25 NOTE — Assessment & Plan Note (Signed)
Pt was in acute CHF with respiratory distress on admission- intubated

## 2016-12-25 NOTE — Patient Instructions (Signed)
Bmet today     Your physician recommends that you schedule a follow-up appointment with Dr.Jordan Thursday  01/30/17 at 10:00 am

## 2016-12-25 NOTE — Progress Notes (Signed)
12/25/2016 Luis Donaldson   05-16-1945  [id number]  Primary Physician Benito Mccreedy, MD Primary Cardiologist: Dr Martinique  HPI:  71 y.o. male with PMH of remote PCI in the 80's at [facility] then CABG in 2000 at [facility]. He was not seeing any provider and had stooped all his medication for at least a year. His explanation was a combination of doing well, cost, and side effects. He presented to [facility] 12/16/16 with chest pain, respiratory failure, hypertensive emergency, persistent runs of NSVT, and what was thought to be STEMI with ST elevation in V1-V2.  He was intubated and an IO line was placed. He underwent emergent heart cath which did not reveal culprit lesion; it showed 2 patent grafts and chronic RCA graft occlusion with collaterals.  His EF was 25-30% with grade 1 DD. Medical rx was resume. He was discharged 12/19/16 and is seen today in f/u. His main complaint is generalized weakness. No chest pain or SOB.     Current Outpatient Medications  Medication Sig Dispense Refill  . aspirin EC 81 MG EC tablet Take 1 tablet (81 mg total) by mouth daily. 30 tablet 11  . atorvastatin (LIPITOR) 80 MG tablet Take 1 tablet (80 mg total) by mouth daily at 6 PM. 30 tablet 11  . furosemide (LASIX) 40 MG tablet Take 1 tablet (40 mg total) by mouth daily. 30 tablet 6  . lisinopril (PRINIVIL,ZESTRIL) 5 MG tablet Take 1 tablet (5 mg total) by mouth daily. 30 tablet 6  . metoprolol tartrate (LOPRESSOR) 25 MG tablet Take 1 tablet (25 mg total) by mouth 2 (two) times daily. 60 tablet 11  . nitroGLYCERIN (NITROSTAT) 0.4 MG SL tablet Place 1 tablet (0.4 mg total) under the tongue every 5 (five) minutes x 3 doses as needed for chest pain. 25 tablet 12  . potassium chloride 20 MEQ TBCR Take 20 mEq by mouth daily. 30 tablet 6  . ranitidine (ZANTAC) 150 MG tablet Take 150 mg by mouth 2 (two) times daily as needed for heartburn.     No current facility-administered medications for this visit.     No Known  Allergies  Past Medical History:  Diagnosis Date  . Coronary artery disease   . Hyperlipidemia   . Hypertension   . MI (myocardial infarction) [facility])     Social History   Socioeconomic History  . Marital status: Divorced    Spouse name: [name]  . Number of children: [name]  . Years of education: [name]  . Highest education level: [name]  Social Needs  . Financial resource strain: [name]  . Food insecurity - worry: [name]  . Food insecurity - inability: [name]  . Transportation needs - medical: [name]  . Transportation needs - non-medical: [name]  Occupational History  . [name]  Tobacco Use  . Smoking status: Former Smoker    Types: Cigarettes    Last attempt to quit: 12/11/2016    Years since quitting: 0.0  . Smokeless tobacco: Never Used  Substance and Sexual Activity  . Alcohol use: [name]  . Drug use: [name]  . Sexual activity: [name]  Other Topics Concern  . [name]  Social History Narrative  . [name]     Family History  Problem Relation Age of Onset  . Heart disease Mother      Review  of Systems: General: negative for chills, fever, night sweats or weight changes.  Cardiovascular: negative for chest pain, dyspnea on exertion, edema, orthopnea, palpitations, paroxysmal nocturnal dyspnea or shortness of breath Dermatological: negative for rash Respiratory: negative for cough or wheezing Urologic: negative for hematuria Abdominal: negative for nausea, vomiting, diarrhea, bright red blood per rectum, melena, or hematemesis Neurologic: negative for visual changes, syncope, or dizziness All other systems reviewed and are otherwise negative except as noted above.    Blood pressure 122/68, pulse 89, height 5\' 9"  (1.753 m), weight 203 lb (92.1 kg).  General appearance: alert, cooperative and no distress Neck: no carotid bruit and no JVD Lungs: clear to auscultation bilaterally Heart:  regular rate and rhythm Extremities: extremities normal, atraumatic, no cyanosis or edema and pain at IO site but no sign of infection or hematoma Skin: Skin color, texture, turgor normal. No rashes or lesions Neurologic: Grossly normal  EKG NSR, PVCs, bigeminy   ASSESSMENT AND PLAN:   STEMI (ST elevation myocardial infarction) ([place]) Presented 12/16/16 with chest pain with ST elevation V1-V2, respiratory failure,accelerated HTN, and NSTVT  CAD (coronary artery disease) Remote PCI in the 90's, CABG inn 2000 at [facility]. - occluded SVG to RCA with L-R collaterals, patent free radial to OM with 60% distal OM1,  Patent LIMA to LAD. Plan is for medical Rx (no PCI done)  HLD (hyperlipidemia) High dose statin Rx added  NSVT (nonsustained ventricular tachycardia) (HCC) PVCs today- he is on beta blocker  Acute systolic CHF (congestive heart failure) ([place]) Pt was in acute CHF with respiratory distress on admission- intubated   PLAN  I did not change his medications. We discussed the importance of compliance with this. I ordered a BMP. If he has any tachycardia he will call- he may need an EP consult.  He'll need an echo in 3 months (consider transition to [facility] if his EF is still depressed and he can demonstrate compliance). Check lipids and LFTs in 6 weeks- f/u with Dr Martinique in 4-6 weeks.   Kerin Ransom PA-C 12/25/2016 10:16 AM

## 2016-12-26 ENCOUNTER — Telehealth (HOSPITAL_COMMUNITY): Payer: Self-pay

## 2016-12-26 DIAGNOSIS — I472 Ventricular tachycardia: Secondary | ICD-10-CM | POA: Diagnosis not present

## 2016-12-26 DIAGNOSIS — Z7982 Long term (current) use of aspirin: Secondary | ICD-10-CM | POA: Diagnosis not present

## 2016-12-26 DIAGNOSIS — J9690 Respiratory failure, unspecified, unspecified whether with hypoxia or hypercapnia: Secondary | ICD-10-CM | POA: Diagnosis not present

## 2016-12-26 DIAGNOSIS — I2510 Atherosclerotic heart disease of native coronary artery without angina pectoris: Secondary | ICD-10-CM | POA: Diagnosis not present

## 2016-12-26 DIAGNOSIS — I2109 ST elevation (STEMI) myocardial infarction involving other coronary artery of anterior wall: Secondary | ICD-10-CM | POA: Diagnosis not present

## 2016-12-26 DIAGNOSIS — Z720 Tobacco use: Secondary | ICD-10-CM | POA: Diagnosis not present

## 2016-12-26 DIAGNOSIS — I5021 Acute systolic (congestive) heart failure: Secondary | ICD-10-CM | POA: Diagnosis not present

## 2016-12-26 DIAGNOSIS — I252 Old myocardial infarction: Secondary | ICD-10-CM | POA: Diagnosis not present

## 2016-12-26 DIAGNOSIS — Z951 Presence of aortocoronary bypass graft: Secondary | ICD-10-CM | POA: Diagnosis not present

## 2016-12-26 DIAGNOSIS — E119 Type 2 diabetes mellitus without complications: Secondary | ICD-10-CM | POA: Diagnosis not present

## 2016-12-26 DIAGNOSIS — I110 Hypertensive heart disease with heart failure: Secondary | ICD-10-CM | POA: Diagnosis not present

## 2016-12-26 DIAGNOSIS — E785 Hyperlipidemia, unspecified: Secondary | ICD-10-CM | POA: Diagnosis not present

## 2016-12-26 NOTE — Telephone Encounter (Signed)
Attempted to call patient in regards to Cardiac Rehab - Lm on vm °

## 2017-01-01 ENCOUNTER — Other Ambulatory Visit: Payer: Self-pay

## 2017-01-01 ENCOUNTER — Telehealth: Payer: Self-pay | Admitting: Cardiology

## 2017-01-01 ENCOUNTER — Emergency Department (HOSPITAL_COMMUNITY): Payer: Medicare Other

## 2017-01-01 ENCOUNTER — Encounter (HOSPITAL_COMMUNITY): Payer: Self-pay | Admitting: Emergency Medicine

## 2017-01-01 ENCOUNTER — Inpatient Hospital Stay (HOSPITAL_COMMUNITY)
Admission: EM | Admit: 2017-01-01 | Discharge: 2017-01-04 | DRG: 308 | Disposition: A | Discharge status: Home / Self Care | Payer: Medicare Other | Attending: Cardiology | Admitting: Cardiology

## 2017-01-01 DIAGNOSIS — I10 Essential (primary) hypertension: Secondary | ICD-10-CM

## 2017-01-01 DIAGNOSIS — I252 Old myocardial infarction: Secondary | ICD-10-CM | POA: Diagnosis not present

## 2017-01-01 DIAGNOSIS — I5023 Acute on chronic systolic (congestive) heart failure: Secondary | ICD-10-CM | POA: Diagnosis present

## 2017-01-01 DIAGNOSIS — I255 Ischemic cardiomyopathy: Secondary | ICD-10-CM | POA: Diagnosis not present

## 2017-01-01 DIAGNOSIS — Z7982 Long term (current) use of aspirin: Secondary | ICD-10-CM | POA: Diagnosis not present

## 2017-01-01 DIAGNOSIS — I472 Ventricular tachycardia: Secondary | ICD-10-CM | POA: Diagnosis not present

## 2017-01-01 DIAGNOSIS — I2583 Coronary atherosclerosis due to lipid rich plaque: Secondary | ICD-10-CM

## 2017-01-01 DIAGNOSIS — I5043 Acute on chronic combined systolic (congestive) and diastolic (congestive) heart failure: Secondary | ICD-10-CM

## 2017-01-01 DIAGNOSIS — Z951 Presence of aortocoronary bypass graft: Secondary | ICD-10-CM | POA: Diagnosis not present

## 2017-01-01 DIAGNOSIS — E785 Hyperlipidemia, unspecified: Secondary | ICD-10-CM | POA: Diagnosis present

## 2017-01-01 DIAGNOSIS — I25708 Atherosclerosis of coronary artery bypass graft(s), unspecified, with other forms of angina pectoris: Secondary | ICD-10-CM | POA: Diagnosis not present

## 2017-01-01 DIAGNOSIS — I2109 ST elevation (STEMI) myocardial infarction involving other coronary artery of anterior wall: Secondary | ICD-10-CM | POA: Diagnosis not present

## 2017-01-01 DIAGNOSIS — E119 Type 2 diabetes mellitus without complications: Secondary | ICD-10-CM | POA: Diagnosis not present

## 2017-01-01 DIAGNOSIS — I2510 Atherosclerotic heart disease of native coronary artery without angina pectoris: Secondary | ICD-10-CM | POA: Diagnosis not present

## 2017-01-01 DIAGNOSIS — R0600 Dyspnea, unspecified: Secondary | ICD-10-CM

## 2017-01-01 DIAGNOSIS — Z87891 Personal history of nicotine dependence: Secondary | ICD-10-CM

## 2017-01-01 DIAGNOSIS — R300 Dysuria: Secondary | ICD-10-CM | POA: Diagnosis present

## 2017-01-01 DIAGNOSIS — I5021 Acute systolic (congestive) heart failure: Secondary | ICD-10-CM | POA: Diagnosis not present

## 2017-01-01 DIAGNOSIS — Z79899 Other long term (current) drug therapy: Secondary | ICD-10-CM

## 2017-01-01 DIAGNOSIS — E782 Mixed hyperlipidemia: Secondary | ICD-10-CM | POA: Diagnosis not present

## 2017-01-01 DIAGNOSIS — R001 Bradycardia, unspecified: Secondary | ICD-10-CM | POA: Diagnosis not present

## 2017-01-01 DIAGNOSIS — I5042 Chronic combined systolic (congestive) and diastolic (congestive) heart failure: Secondary | ICD-10-CM | POA: Diagnosis not present

## 2017-01-01 DIAGNOSIS — I110 Hypertensive heart disease with heart failure: Secondary | ICD-10-CM | POA: Diagnosis present

## 2017-01-01 DIAGNOSIS — R197 Diarrhea, unspecified: Secondary | ICD-10-CM | POA: Diagnosis present

## 2017-01-01 DIAGNOSIS — I4729 Other ventricular tachycardia: Secondary | ICD-10-CM

## 2017-01-01 LAB — CBC
HCT: 44.6 % (ref 39.0–52.0)
HEMATOCRIT: 42.3 % (ref 39.0–52.0)
HEMOGLOBIN: 14.5 g/dL (ref 13.0–17.0)
Hemoglobin: 15 g/dL (ref 13.0–17.0)
MCH: 31.6 pg (ref 26.0–34.0)
MCH: 32.4 pg (ref 26.0–34.0)
MCHC: 33.6 g/dL (ref 30.0–36.0)
MCHC: 34.3 g/dL (ref 30.0–36.0)
MCV: 93.9 fL (ref 78.0–100.0)
MCV: 94.4 fL (ref 78.0–100.0)
Platelets: 198 10*3/uL (ref 150–400)
Platelets: 164 10*3/uL (ref 150–400)
RBC: 4.75 MIL/uL (ref 4.22–5.81)
RBC: 4.48 MIL/uL (ref 4.22–5.81)
RDW: 13.2 % (ref 11.5–15.5)
RDW: 13.6 % (ref 11.5–15.5)
WBC: 9.7 10*3/uL (ref 4.0–10.5)
WBC: 10.4 10*3/uL (ref 4.0–10.5)

## 2017-01-01 LAB — BASIC METABOLIC PANEL
Anion gap: 9 (ref 5–15)
BUN: 21 mg/dL — ABNORMAL HIGH (ref 6–20)
CO2: 23 mmol/L (ref 22–32)
Calcium: 8.8 mg/dL — ABNORMAL LOW (ref 8.9–10.3)
Chloride: 104 mmol/L (ref 101–111)
Creatinine, Ser: 1.26 mg/dL — ABNORMAL HIGH (ref 0.61–1.24)
GFR calc Af Amer: >60 mL/min (ref >60)
GFR calc non Af Amer: 56 mL/min — ABNORMAL LOW (ref >60)
Glucose, Bld: 209 mg/dL — ABNORMAL HIGH (ref 65–99)
Potassium: 4.8 mmol/L (ref 3.5–5.1)
Sodium: 136 mmol/L (ref 135–145)

## 2017-01-01 LAB — I-STAT TROPONIN, ED: Troponin i, poc: 0.05 ng/mL (ref 0.00–0.08)

## 2017-01-01 LAB — TROPONIN I: Troponin I: 0.03 ng/mL (ref <0.03)

## 2017-01-01 LAB — MAGNESIUM: Magnesium: 2.1 mg/dL (ref 1.7–2.4)

## 2017-01-01 MED ORDER — ASPIRIN 81 MG PO CHEW
324.0000 mg | CHEWABLE_TABLET | ORAL | Status: AC
  Administered 2017-01-01: 324 mg via ORAL
  Filled 2017-01-01: qty 4

## 2017-01-01 MED ORDER — FUROSEMIDE 40 MG PO TABS
40.0000 mg | ORAL_TABLET | Freq: Every day | ORAL | Status: DC
  Administered 2017-01-02 – 2017-01-04 (×3): 40 mg via ORAL
  Filled 2017-01-01: qty 1
  Filled 2017-01-01: qty 2
  Filled 2017-01-01: qty 1

## 2017-01-01 MED ORDER — ATORVASTATIN CALCIUM 80 MG PO TABS
80.0000 mg | ORAL_TABLET | Freq: Every day | ORAL | Status: DC
  Administered 2017-01-01 – 2017-01-03 (×3): 80 mg via ORAL
  Filled 2017-01-01 (×4): qty 1

## 2017-01-01 MED ORDER — HEPARIN SODIUM (PORCINE) 5000 UNIT/ML IJ SOLN
5000.0000 [IU] | Freq: Three times a day (TID) | INTRAMUSCULAR | Status: DC
  Administered 2017-01-02 – 2017-01-04 (×6): 5000 [IU] via SUBCUTANEOUS
  Filled 2017-01-01 (×6): qty 1

## 2017-01-01 MED ORDER — ONDANSETRON HCL 4 MG/2ML IJ SOLN: 4.0000 mg | Freq: Four times a day (QID) | INTRAMUSCULAR | Status: DC | PRN

## 2017-01-01 MED ORDER — ASPIRIN EC 81 MG PO TBEC
81.0000 mg | DELAYED_RELEASE_TABLET | Freq: Every day | ORAL | Status: DC
  Administered 2017-01-02 – 2017-01-04 (×3): 81 mg via ORAL
  Filled 2017-01-01 (×3): qty 1

## 2017-01-01 MED ORDER — POTASSIUM CHLORIDE CRYS ER 10 MEQ PO TBCR
20.0000 meq | EXTENDED_RELEASE_TABLET | Freq: Every day | ORAL | Status: DC
Start: 2017-01-03 — End: 2017-01-04
  Administered 2017-01-03 – 2017-01-04 (×2): 20 meq via ORAL
  Filled 2017-01-01 (×2): qty 2

## 2017-01-01 MED ORDER — NITROGLYCERIN 0.4 MG SL SUBL: 0.4000 mg | SUBLINGUAL_TABLET | SUBLINGUAL | Status: DC | PRN

## 2017-01-01 MED ORDER — LISINOPRIL 5 MG PO TABS
5.0000 mg | ORAL_TABLET | Freq: Every day | ORAL | Status: DC
  Administered 2017-01-02 – 2017-01-03 (×2): 5 mg via ORAL
  Filled 2017-01-01 (×2): qty 1

## 2017-01-01 MED ORDER — AMIODARONE LOAD VIA INFUSION
150.0000 mg | Freq: Once | INTRAVENOUS | Status: AC
  Administered 2017-01-01: 150 mg via INTRAVENOUS
  Filled 2017-01-01: qty 83.34

## 2017-01-01 MED ORDER — AMIODARONE HCL IN DEXTROSE 360-4.14 MG/200ML-% IV SOLN
60.0000 mg/h | INTRAVENOUS | Status: AC
  Administered 2017-01-01: 60 mg/h via INTRAVENOUS
  Filled 2017-01-01 (×2): qty 200

## 2017-01-01 MED ORDER — METOPROLOL TARTRATE 25 MG PO TABS
25.0000 mg | ORAL_TABLET | Freq: Two times a day (BID) | ORAL | Status: DC
  Administered 2017-01-01 – 2017-01-04 (×4): 25 mg via ORAL
  Filled 2017-01-01 (×7): qty 1

## 2017-01-01 MED ORDER — AMIODARONE HCL IN DEXTROSE 360-4.14 MG/200ML-% IV SOLN
30.0000 mg/h | INTRAVENOUS | Status: DC
  Administered 2017-01-02: 30 mg/h via INTRAVENOUS
  Filled 2017-01-01: qty 200

## 2017-01-01 MED ORDER — MIRTAZAPINE 15 MG PO TBDP
7.5000 mg | ORAL_TABLET | Freq: Every day | ORAL | Status: DC
  Administered 2017-01-01 – 2017-01-02 (×2): 7.5 mg via ORAL
  Filled 2017-01-01 (×3): qty 0.5

## 2017-01-01 MED ORDER — ASPIRIN EC 81 MG PO TBEC: 81.0000 mg | DELAYED_RELEASE_TABLET | Freq: Every day | ORAL | Status: DC

## 2017-01-01 MED ORDER — ACETAMINOPHEN 325 MG PO TABS: 650.0000 mg | ORAL_TABLET | ORAL | Status: DC | PRN

## 2017-01-01 MED ORDER — ASPIRIN 300 MG RE SUPP: 300.0000 mg | RECTAL | Status: AC

## 2017-01-01 NOTE — ED Notes (Signed)
Patient having more runs of VT - strips printed off and showed to MD. No new orders at this time. Zoll pads remain on patient. Pt denies any symptoms when this occurs.

## 2017-01-01 NOTE — ED Notes (Signed)
Cardiology at bedside.

## 2017-01-01 NOTE — Telephone Encounter (Signed)
[facility] Physical therapist states that pt's HR is 30 BPM and she is sending him to the ER right now [place] notified

## 2017-01-01 NOTE — ED Notes (Signed)
Ordered dinner tray.  

## 2017-01-01 NOTE — ED Triage Notes (Signed)
Per Pt. NO currently complaints. Pt was visited from at home nurse and told his pulse was low in the 30's. Neighbor brought Pt to ED.  PA notified. No pain. Cardiac Hx.

## 2017-01-01 NOTE — ED Provider Notes (Signed)
[place] EMERGENCY DEPARTMENT Provider Note   CSN: [id number] Arrival date & time: 01/01/17  1550     History   Chief Complaint Chief Complaint  Patient presents with  . Chest Pain    HPI Luis Donaldson is a 71 y.o. male.  HPI   71yM with bradycardia generalized weakness and dyspnea.  Recent admission with chest pain, respiratory failure requiring intubation, hypertensive emergency and frequent non-sustained v-tach.  He has a past history of remote PCI and CABG.  He had a catheterization during this past hospitalization which did not show a culprit vessel.  EF was 25-30% with grade 1 diastolic dysfunction.  He was discharged on aspirin, Lipitor, Lasix, lisinopril, potassium, nitroglycerin as needed and metoprolol 25 mg twice daily.  Today a home health aide came and noted that he had a heart rate in the 30s.  Patient states that he has not felt well since discharge.  Primarily just generalized weakness but also dyspnea which is worse with mild exertion.  He reports compliance with his medication.  He is additionally complaining of dysuria and diarrhea.  No fevers or chills.  No cough.  No unusual swelling.  Past Medical History:  Diagnosis Date  . Coronary artery disease   . Hyperlipidemia   . Hypertension   . MI (myocardial infarction) [facility])     Patient Active Problem List   Diagnosis Date Noted  . STEMI (ST elevation myocardial infarction) ([place]) 12/16/2016  . NSVT (nonsustained ventricular tachycardia) ([place]) 12/16/2016  . Respiratory failure ([place]) 12/16/2016  . CAD (coronary artery disease) 12/16/2016  . Hx of CABG 12/16/2016  . HLD (hyperlipidemia) 12/16/2016  . Tobacco use 12/16/2016  . Acute systolic CHF (congestive heart failure) ([place]) 12/16/2016  . Paroxysmal ventricular tachycardia ([place])     Past Surgical History:  Procedure Laterality Date  . CORONARY ARTERY BYPASS GRAFT    . LEFT HEART CATH AND CORONARY ANGIOGRAPHY N/A 12/16/2016   Procedure: LEFT HEART CATH AND CORONARY ANGIOGRAPHY;  Surgeon: Martinique, Peter M, MD;  Location: [place] CV LAB;  Service: Cardiovascular;  Laterality: N/A;       Home Medications    Prior to Admission medications   Medication Sig Start Date End Date Taking? Authorizing Provider  aspirin EC 81 MG EC tablet Take 1 tablet (81 mg total) by mouth daily. 12/20/16   Leanor Kail, PA  atorvastatin (LIPITOR) 80 MG tablet Take 1 tablet (80 mg total) by mouth daily at 6 PM. 12/19/16   Bhagat, Bhavinkumar, PA  furosemide (LASIX) 40 MG tablet Take 1 tablet (40 mg total) by mouth daily. 12/20/16   Bhagat, Crista Luria, PA  lisinopril (PRINIVIL,ZESTRIL) 5 MG tablet Take 1 tablet (5 mg total) by mouth daily. 12/20/16   Bhagat, Crista Luria, PA  metoprolol tartrate (LOPRESSOR) 25 MG tablet Take 1 tablet (25 mg total) by mouth 2 (two) times daily. 12/19/16 12/19/17  Leanor Kail, PA  nitroGLYCERIN (NITROSTAT) 0.4 MG SL tablet Place 1 tablet (0.4 mg total) under the tongue every 5 (five) minutes x 3 doses as needed for chest pain. 12/19/16   Bhagat, Crista Luria, PA  potassium chloride 20 MEQ TBCR Take 20 mEq by mouth daily. 12/19/16   Bhagat, Crista Luria, PA  ranitidine (ZANTAC) 150 MG tablet Take 150 mg by mouth 2 (two) times daily as needed for heartburn.    [provider]    Family History Family History  Problem Relation Age of Onset  . Heart disease Mother     Social  History Social History   Tobacco Use  . Smoking status: Former Smoker    Types: Cigarettes    Last attempt to quit: 12/11/2016    Years since quitting: 0.0  . Smokeless tobacco: Never Used  Substance Use Topics  . Alcohol use: No    Frequency: Never  . Drug use: No     Allergies   Patient has no known allergies.   Review of Systems Review of Systems  All systems reviewed and negative, other than as noted in HPI.   Physical Exam Updated Vital Signs BP (!) 165/112 (BP Location: Right Arm)    Pulse (!) 32   Temp 98.1 F (36.7 C) (Oral)   Resp 17   Ht 5\' 8"  (1.727 m)   Wt 92.1 kg (203 lb)   SpO2 100%   BMI 30.87 kg/m   Physical Exam  Constitutional: He appears well-developed and well-nourished. No distress.  Laying in bed. NAD.   HENT:  Head: Normocephalic and atraumatic.  Eyes: Conjunctivae are normal. Right eye exhibits no discharge. Left eye exhibits no discharge.  Neck: Neck supple.  Cardiovascular: Regular rhythm. Exam reveals no gallop and no friction rub.  No murmur heard. bradycardic  Pulmonary/Chest: Effort normal and breath sounds normal. No respiratory distress.  Abdominal: Soft. He exhibits no distension. There is no tenderness.  Musculoskeletal: He exhibits no edema or tenderness.  Neurological: He is alert.  Skin: Skin is warm and dry.  Psychiatric: He has a normal mood and affect. His behavior is normal. Thought content normal.  Nursing note and vitals reviewed.    ED Treatments / Results  Labs (all labs ordered are listed, but only abnormal results are displayed) Labs Reviewed  BASIC METABOLIC PANEL - Abnormal; Notable for the following components:      Result Value   Glucose, Bld 209 (*)    BUN 21 (*)    Creatinine, Ser 1.26 (*)    Calcium 8.8 (*)    GFR calc non Af Amer 56 (*)    All other components within normal limits  TROPONIN I - Abnormal; Notable for the following components:   Troponin I 0.03 (*)    All other components within normal limits  CBC  MAGNESIUM  URINALYSIS, ROUTINE W REFLEX MICROSCOPIC  I-STAT TROPONIN, ED    EKG  EKG Interpretation  Date/Time:  Wednesday January 01 2017 15:55:32 EST Ventricular Rate:  99 PR Interval:    QRS Duration: 96 QT Interval:  354 QTC Calculation: 454 R Axis:   59 Text Interpretation:  sinus rythm with frequent ectopy/non-sustained v-tach Confirmed by Virgel Manifold [phone]) on 01/01/2017 4:22:32 PM       Radiology No results found.  Procedures Procedures (including critical  care time)  Medications Ordered in ED Medications - No data to display   Initial Impression / Assessment and Plan / ED Course  I have reviewed the triage vital signs and the nursing notes.  Pertinent labs & imaging results that were available during my care of the patient were reviewed by me and considered in my medical decision making (see chart for details).     71yM with generalized weakness, dyspnea and bradycardia.  He denies having any chest pain recently.  Rhythm is primarily ventricular bigeminy.  Occasional sinus but with a lot of ectopy and short runs of nonsustained V. tach.  His pressure is good.  He does not appear ill. He reports compliance with his meds. Will check labs. Will discuss with cardiology.  Final Clinical Impressions(s) / ED Diagnoses   Final diagnoses:  Dyspnea, unspecified type  NSVT (nonsustained ventricular tachycardia) [facility])    ED Discharge Orders    None       Virgel Manifold, MD 01/07/17 1112

## 2017-01-01 NOTE — H&P (Addendum)
Patient ID: Luis Donaldson MRN: [id number], DOB/AGE: Jul 10, 1945   Admit date: 01/01/2017   Primary Physician: Benito Mccreedy, MD Primary Cardiologist: Dr Martinique  Pt. Profile:  Bradycardia, nsVTs  Problem List  Past Medical History:  Diagnosis Date  . Coronary artery disease   . Hyperlipidemia   . Hypertension   . MI (myocardial infarction) [facility])     Past Surgical History:  Procedure Laterality Date  . CORONARY ARTERY BYPASS GRAFT    . LEFT HEART CATH AND CORONARY ANGIOGRAPHY N/A 12/16/2016   Procedure: LEFT HEART CATH AND CORONARY ANGIOGRAPHY;  Surgeon: Martinique, Peter M, MD;  Location: [place] CV LAB;  Service: Cardiovascular;  Laterality: N/A;     Allergies  No Known Allergies  HPI  Patient is a 71 y.o. male with a PMHx of remote PCI in the 35's at [facility] then CABG in 2000, who came to [facility] ER today 01/01/2017 for evaluation of bradycardia.  71 y.o.malewith PMH of remote PCI in the 90's at [facility] then CABG in 2000 at [facility]. He was not seeing any provider and had stooped all his medication for at least a year. His explanation was a combination of doing well, cost, and side effects. [name] 12/16/16 with chest pain, respiratory failure, hypertensive emergency, persistent runs of NSVT, and what was thought to be STEMI with ST elevation in V1-V2.  He was intubated and an IO line was placed. He underwent emergent heart cath which did not reveal culprit lesion; it showed 2 patent grafts and chronic RCA graft occlusion with collaterals.  His EF was 25-30% with grade 1 DD. Medical rx was resume. He was discharged 12/19/16 and is seen in f/u by Kerin Ransom on 12/25/2016. His main complaint was generalized weakness. He denied chest pain or SOB. He was doing ok, his medications were not changed.   He presented today after he called the clinic and complained of frequent urination, orthopnea, SOB at rest. He was advised to go to the ER. We were consulted at the request of  ER physician Dr Virgel Manifold for concern of bradycardia. The patient denies any chest pain, SOB, no dizziness, palpitations, no syncope.  Home Medications  Prior to Admission medications   Medication Sig Start Date End Date Taking? Authorizing Provider  aspirin EC 81 MG EC tablet Take 1 tablet (81 mg total) by mouth daily. 12/20/16  Yes Bhagat, Bhavinkumar, PA  atorvastatin (LIPITOR) 80 MG tablet Take 1 tablet (80 mg total) by mouth daily at 6 PM. 12/19/16  Yes Bhagat, Bhavinkumar, PA  furosemide (LASIX) 40 MG tablet Take 1 tablet (40 mg total) by mouth daily. 12/20/16  Yes Bhagat, Bhavinkumar, PA  lisinopril (PRINIVIL,ZESTRIL) 5 MG tablet Take 1 tablet (5 mg total) by mouth daily. 12/20/16  Yes Bhagat, Bhavinkumar, PA  metoprolol tartrate (LOPRESSOR) 25 MG tablet Take 1 tablet (25 mg total) by mouth 2 (two) times daily. 12/19/16 12/19/17 Yes Bhagat, Bhavinkumar, PA  nitroGLYCERIN (NITROSTAT) 0.4 MG SL tablet Place 1 tablet (0.4 mg total) under the tongue every 5 (five) minutes x 3 doses as needed for chest pain. 12/19/16  Yes Bhagat, Bhavinkumar, PA  potassium chloride 20 MEQ TBCR Take 20 mEq by mouth daily. 12/19/16  Yes Bhagat, Bhavinkumar, PA  ranitidine (ZANTAC) 150 MG tablet Take 150 mg by mouth 2 (two) times daily as needed for heartburn. Uses [employer] brand   Yes [provider]    Family History  Family History  Problem Relation Age of  Onset  . Heart disease Mother     Social History  Social History   Socioeconomic History  . Marital status: Divorced    Spouse name: [name]  . Number of children: [name]  . Years of education: [name]  . Highest education level: [name]  Social Needs  . Financial resource strain: [name]  . Food insecurity - worry: [name]  . Food insecurity - inability: [name]  . Transportation needs - medical: [name]  . Transportation needs - non-medical: [name]  Occupational History  . [name]    Tobacco Use  . Smoking status: Former Smoker    Types: Cigarettes    Last attempt to quit: 12/11/2016    Years since quitting: 0.0  . Smokeless tobacco: Never Used  Substance and Sexual Activity  . Alcohol use: No    Frequency: Never  . Drug use: No  . Sexual activity: [name]  Other Topics Concern  . [name]  Social History Narrative  . [name]     Review of Systems General:  No chills, fever, night sweats or weight changes.  Cardiovascular:  No chest pain, dyspnea on exertion, edema, orthopnea, palpitations, paroxysmal nocturnal dyspnea. Dermatological: No rash, lesions/masses Respiratory: No cough, dyspnea Urologic: No hematuria, dysuria Abdominal:   No nausea, vomiting, diarrhea, bright red blood per rectum, melena, or hematemesis Neurologic:  No visual changes, wkns, changes in mental status. All other systems reviewed and are otherwise negative except as noted above.  Physical Exam  Blood pressure (!) 147/94, pulse (!) 30, temperature 98.1 F (36.7 C), temperature source Oral, resp. rate 20, height 5\' 8"  (1.727 m), weight 203 lb (92.1 kg), SpO2 96 %.  General: Pleasant, NAD Psych: Normal affect. Neuro: Alert and oriented X 3. Moves all extremities spontaneously. HEENT: Normal  Neck: Supple without bruits or JVD. Lungs:  Resp regular and unlabored, CTA. Heart: RRR no s3, s4, or murmurs. Abdomen: Soft, non-tender, non-distended, BS + x 4.  Extremities: No clubbing, cyanosis or edema. DP/PT/Radials 2+ and equal bilaterally.  Labs  No results for input(s): CKTOTAL, CKMB, TROPONINI in the last 72 hours. Lab Results  Component Value Date   WBC 9.7 01/01/2017   HGB 15.0 01/01/2017   HCT 44.6 01/01/2017   MCV 93.9 01/01/2017   PLT 198 01/01/2017   No results for input(s): NA, K, CL, CO2, BUN, CREATININE, CALCIUM, PROT, BILITOT, ALKPHOS, ALT, AST, GLUCOSE in the last 168 hours.  Invalid input(s): LABALBU Lab Results  Component Value Date   CHOL 202  (H) 12/17/2016   HDL 31 (L) 12/17/2016   LDLCALC 125 (H) 12/17/2016   TRIG 231 (H) 12/17/2016   No results found for: DDIMER Invalid input(s): POCBNP   Radiology/Studies  Dg Chest Portable 1 View  Result Date: 01/01/2017 CLINICAL DATA:  71 year old male with bradycardia. EXAM: PORTABLE CHEST 1 VIEW COMPARISON:  12/18/2016 and earlier. FINDINGS: Portable AP semi upright view at at 1656 hours. Resuscitation or pacing pads project over the central and left chest. Stable cardiomegaly and mediastinal contours. Stable lung volumes. Allowing for portable technique the lungs are clear. Visualized tracheal air column is within normal limits. No acute osseous abnormality identified. Prior sternotomy. IMPRESSION: No acute cardiopulmonary abnormality. Electronically Signed   By: Genevie Ann M.D.   On: 01/01/2017 17:24   Dg Chest Port 1 View  Result Date: 12/18/2016 CLINICAL DATA:  71 year old  male status post STEMI. Status post intubation and cardiac catheterization. EXAM: PORTABLE CHEST 1 VIEW COMPARISON:  12/17/2016 and earlier. FINDINGS: Portable AP semi upright view at 0527 hours. Extubated. Enteric tube removed. Stable lung volumes. Stable cardiac size and mediastinal contours. No pneumothorax. Further decreased pulmonary vascularity improved left lung base ventilation. Mild nonspecific patchy opacity at the right lung base. No definite pleural effusion. IMPRESSION: 1. Extubated and enteric tube removed.  Stable lung volumes. 2. Further regression of pulmonary vascularity, and improved left lung base ventilation. 3. Mild patchy opacity at the right lung base, favor atelectasis. Electronically Signed   By: Genevie Ann M.D.   On: 12/18/2016 08:37   Portable Chest Xray  Result Date: 12/17/2016 CLINICAL DATA:  Intubation. EXAM: PORTABLE CHEST 1 VIEW COMPARISON:  12/16/2016. FINDINGS: Endotracheal tube and NG tube in stable position. Prior median sternotomy. Cardiomegaly with normal pulmonary vascularity. Interim  improvement of interstitial prominence suggesting improving CHF. No prominent pleural effusion. Costophrenic angles incompletely imaged. No pneumothorax identified. IMPRESSION: 1.  Endotracheal tube and NG tube in stable position. 2. Prior CABG. Stable cardiomegaly. Interim improvement of interstitial prominence suggesting improving CHF. Electronically Signed   By: Marcello Moores  Register   On: 12/17/2016 08:42   Dg Chest Port 1 View  Result Date: 12/16/2016 CLINICAL DATA:  Endotracheal tube placement. EXAM: PORTABLE CHEST 1 VIEW COMPARISON:  None. FINDINGS: Mild cardiomegaly is noted. Endotracheal tube is seen projected over tracheal air shadow with distal tip 7 cm above the carina. Nasogastric tube is seen entering the stomach. No pneumothorax or pleural effusion is noted. Mild central pulmonary vascular congestion and possible perihilar edema is noted. Bony thorax is unremarkable. IMPRESSION: Endotracheal and nasogastric tubes in grossly good position. Mild cardiomegaly with central pulmonary vascular congestion is noted. Possible bilateral perihilar edema. Electronically Signed   By: Marijo Conception, M.D.   On: 12/16/2016 18:24   Echocardiogram 12/17/2016 - Left ventricle: The cavity size was normal. Systolic function was   severely reduced. The estimated ejection fraction was in the   range of 25% to 30%. Anterior wall, apical and mid to distal   inferoapical thinning and akinesis suggestive of extensive LAD   territory infarct, basal to mid inferior wall and lateral wall   hypokinesis. Doppler parameters are consistent with abnormal left   ventricular relaxation (grade 1 diastolic dysfunction). LV   filling pressure appears elevated (b-bump noted on mitral   m-mode). - Aortic valve: Sclerosis without stenosis. There was trivial   regurgitation. - Mitral valve: Mildly thickened leaflets . Systolic bowing without   prolapse. There was mild regurgitation. - Left atrium: The atrium was normal in  size. - Right ventricle: The cavity size was normal. Mild hypokinesis. - Systemic veins: The IVC measures <2.1 cm, but does not collapse   >50%, suggesting an elevated RA pressure of 8 mmHg.  Impressions:  - LVEF 25-30%, moderate LVH with anterior wall thinning and   enhanced echogenicity suggestive of infarct, there is anterior,   apical and mid to distal inferoapical akinesis suggestive of   extensive LAD territroy infarct, inferior and lateral wall   hypokinesis also noted. grade 1 DD, biventricular filling   pressures are elevated.  ECG: SR, very frequent nsVTs, up to 4 beats, personally reviewed    ASSESSMENT AND PLAN  nsVT  Telemetry shows underlying sinus bradycardia to sinus rhythm with ery frequent nsVTs lasting 3-7 beats, sometimes almost after every single sinus eat. The patient is asymptomatic, no dizziness or syncope and BP  is normal - start amiodarone drip - EP consult in the am - continue low dose carvedilol 3.125 mg PO BID  CAD (coronary artery disease) Remote PCI in the 90's, CABG inn 2000 at [facility]. - occluded SVG to RCA with L-R collaterals, patent free radial to OM with 60% distal OM1,  Patent LIMA to LAD. Plan is for medical Rx (no PCI done)  HLD (hyperlipidemia) High dose statin Rx added  Acute systolic CHF (congestive heart failure) ([place]) Pt appears euvolemic  Dysuria, Polyuria - send UA and urine cultures  DVT PPX - Lovenox sq   Signed, Ena Dawley, MD, [facility] 01/01/2017, 6:06 PM

## 2017-01-01 NOTE — Telephone Encounter (Signed)
Pt called, several complaints since I saw him 12/6. He is urinating "every hour" since starting his medications. He also complains of burning when he urinates, and being SOB- "can't lay down". I suggested he come to the ED for further evaluation.  Kerin Ransom PA-C 01/01/2017 9:05 AM

## 2017-01-02 ENCOUNTER — Other Ambulatory Visit: Payer: Self-pay

## 2017-01-02 DIAGNOSIS — E782 Mixed hyperlipidemia: Secondary | ICD-10-CM

## 2017-01-02 DIAGNOSIS — I5042 Chronic combined systolic (congestive) and diastolic (congestive) heart failure: Secondary | ICD-10-CM

## 2017-01-02 LAB — BASIC METABOLIC PANEL
Anion gap: 9 (ref 5–15)
BUN: 19 mg/dL (ref 6–20)
CO2: 21 mmol/L — ABNORMAL LOW (ref 22–32)
Calcium: 8.4 mg/dL — ABNORMAL LOW (ref 8.9–10.3)
Chloride: 107 mmol/L (ref 101–111)
Creatinine, Ser: 1.14 mg/dL (ref 0.61–1.24)
GFR calc Af Amer: >60 mL/min (ref >60)
GFR calc non Af Amer: >60 mL/min (ref >60)
Glucose, Bld: 180 mg/dL — ABNORMAL HIGH (ref 65–99)
Potassium: 4 mmol/L (ref 3.5–5.1)
Sodium: 137 mmol/L (ref 135–145)

## 2017-01-02 LAB — HEPATIC FUNCTION PANEL
ALBUMIN: 3 g/dL — AB (ref 3.5–5.0)
ALK PHOS: 117 U/L (ref 38–126)
ALT: 30 U/L (ref 17–63)
AST: 22 U/L (ref 15–41)
BILIRUBIN DIRECT: 0.2 mg/dL (ref 0.1–0.5)
BILIRUBIN TOTAL: 1.1 mg/dL (ref 0.3–1.2)
Indirect Bilirubin: 0.9 mg/dL (ref 0.3–0.9)
Total Protein: 4.6 g/dL — ABNORMAL LOW (ref 6.5–8.1)

## 2017-01-02 LAB — MAGNESIUM: Magnesium: 2.1 mg/dL (ref 1.7–2.4)

## 2017-01-02 LAB — URINALYSIS, ROUTINE W REFLEX MICROSCOPIC
Bilirubin Urine: NEGATIVE
Glucose, UA: NEGATIVE mg/dL
Hgb urine dipstick: NEGATIVE
Ketones, ur: NEGATIVE mg/dL
Leukocytes, UA: NEGATIVE
Nitrite: NEGATIVE
Protein, ur: NEGATIVE mg/dL
Specific Gravity, Urine: 1.016 (ref 1.005–1.030)
pH: 5 (ref 5.0–8.0)

## 2017-01-02 LAB — TSH: TSH: 3.479 u[IU]/mL (ref 0.350–4.500)

## 2017-01-02 LAB — CREATININE, SERUM
CREATININE: 1.28 mg/dL — AB (ref 0.61–1.24)
GFR calc Af Amer: >60 mL/min (ref >60)
GFR calc non Af Amer: 55 mL/min — ABNORMAL LOW (ref >60)

## 2017-01-02 LAB — TROPONIN I
Troponin I: <0.03 ng/mL (ref <0.03)
Troponin I: 0.03 ng/mL (ref <0.03)

## 2017-01-02 LAB — T4, FREE: FREE T4: 0.99 ng/dL (ref 0.61–1.12)

## 2017-01-02 NOTE — Plan of Care (Signed)
Pt on Amio drip; no SOB; resting in room; vitals WDL or trending towards WDL; denies pain; A & O x4; will continue to monitor.  [place]  Annelisa Ryback, RN

## 2017-01-02 NOTE — ED Notes (Signed)
Heart Healthy diet lunch tray ordered @ 0945.  

## 2017-01-02 NOTE — Consult Note (Signed)
ELECTROPHYSIOLOGY CONSULT NOTE    Patient ID: Luis Donaldson MRN: [id number], DOB/AGE: 1945/04/21 71 y.o.  Admit date: 01/01/2017 Date of Consult: 01/02/2017  Primary Physician: Benito Mccreedy, MD Primary Cardiologist: Martinique  Electrophysiologist: Curt Bears (new this admission)  Patient Profile: Luis Donaldson is a 71 y.o. male with a history of CAD s/p CABG, ICM, chronic systolic heart failure who is being seen today for the evaluation of NSVT at the request of Dr Meda Coffee.  HPI:  Luis Donaldson is a 71 y.o. male with the above past medical history.  He underwent CABG in 2000 at [facility] and did not follow up with cardiology after that. He was admitted 11/2016 with respiratory failure and underwent repeat cath with no targets for revascularization. He was started on guideline directed therapy with plans to be evaluated for ICD if EF did not improve. Yesterday, he had frequent urination with burning shortness of breath at rest, and orthopnea. He states he has not been able to sleep since discharge in November. He also complains of leg pain and diarrhea.  He reports compliance with medications to date but would like to stop them.  He has had some orthostatic intolerance but no syncope or palpitations.  On arrival to the ER, he was found to have frequent runs of NSVT and IV amiodarone was started with improvement.    Echo 11/2016 demonstrated EF 25-30%, mild MR, extensive LAD territory infarct.    Past Medical History:  Diagnosis Date  . Coronary artery disease   . Hyperlipidemia   . Hypertension   . MI (myocardial infarction) [facility])      Surgical History:  Past Surgical History:  Procedure Laterality Date  . CORONARY ARTERY BYPASS GRAFT    . LEFT HEART CATH AND CORONARY ANGIOGRAPHY N/A 12/16/2016   Procedure: LEFT HEART CATH AND CORONARY ANGIOGRAPHY;  Surgeon: Martinique, Peter M, MD;  Location: [place] [facility];  Service: Cardiovascular;  Laterality: N/A;      Current  Facility-Administered Medications:  .  acetaminophen (TYLENOL) tablet 650 mg, 650 mg, Oral, Q4H PRN, Isaiah Serge, NP .  [COMPLETED] amiodarone (NEXTERONE) 1.8 mg/mL load via infusion 150 mg, 150 mg, Intravenous, Once, 150 mg at 01/01/17 1951 **FOLLOWED BY** [EXPIRED] amiodarone (NEXTERONE PREMIX) 360-4.14 MG/200ML-% (1.8 mg/mL) IV infusion, 60 mg/hr, Intravenous, Continuous, Stopped at 01/02/17 0128 **FOLLOWED BY** amiodarone (NEXTERONE PREMIX) 360-4.14 MG/200ML-% (1.8 mg/mL) IV infusion, 30 mg/hr, Intravenous, Continuous, Dorothy Spark, MD, Last Rate: 16.7 mL/hr at 01/02/17 0132, 30 mg/hr at 01/02/17 0132 .  aspirin EC tablet 81 mg, 81 mg, Oral, Daily, Cecilie Kicks R, NP .  atorvastatin (LIPITOR) tablet 80 mg, 80 mg, Oral, q1800, Isaiah Serge, NP, 80 mg at 01/01/17 2349 .  furosemide (LASIX) tablet 40 mg, 40 mg, Oral, Daily, Cecilie Kicks R, NP .  heparin injection 5,000 Units, 5,000 Units, Subcutaneous, Q8H, Isaiah Serge, NP, 5,000 Units at 01/02/17 [phone] .  lisinopril (PRINIVIL,ZESTRIL) tablet 5 mg, 5 mg, Oral, Daily, Cecilie Kicks R, NP .  metoprolol tartrate (LOPRESSOR) tablet 25 mg, 25 mg, Oral, BID, Isaiah Serge, NP, 25 mg at 01/01/17 2300 .  mirtazapine (REMERON SOL-TAB) disintegrating tablet 7.5 mg, 7.5 mg, Oral, QHS, Chakravartti, Jaidip, MD, 7.5 mg at 01/01/17 2349 .  nitroGLYCERIN (NITROSTAT) SL tablet 0.4 mg, 0.4 mg, Sublingual, Q5 Min x 3 PRN, Cecilie Kicks R, NP .  nitroGLYCERIN (NITROSTAT) SL tablet 0.4 mg, 0.4 mg, Sublingual, Q5 Min x 3 PRN, Isaiah Serge, NP .  ondansetron (ZOFRAN) injection 4 mg, 4 mg, Intravenous, Q6H PRN, Isaiah Serge, NP .  Derrill Memo ON 01/03/2017] potassium chloride (K-DUR,KLOR-CON) CR tablet 20 mEq, 20 mEq, Oral, Daily, Isaiah Serge, NP  Current Outpatient Medications:  .  aspirin EC 81 MG EC tablet, Take 1 tablet (81 mg total) by mouth daily., Disp: 30 tablet, Rfl: 11 .  atorvastatin (LIPITOR) 80 MG tablet, Take 1 tablet (80 mg total)  by mouth daily at 6 PM., Disp: 30 tablet, Rfl: 11 .  furosemide (LASIX) 40 MG tablet, Take 1 tablet (40 mg total) by mouth daily., Disp: 30 tablet, Rfl: 6 .  lisinopril (PRINIVIL,ZESTRIL) 5 MG tablet, Take 1 tablet (5 mg total) by mouth daily., Disp: 30 tablet, Rfl: 6 .  metoprolol tartrate (LOPRESSOR) 25 MG tablet, Take 1 tablet (25 mg total) by mouth 2 (two) times daily., Disp: 60 tablet, Rfl: 11 .  nitroGLYCERIN (NITROSTAT) 0.4 MG SL tablet, Place 1 tablet (0.4 mg total) under the tongue every 5 (five) minutes x 3 doses as needed for chest pain., Disp: 25 tablet, Rfl: 12 .  potassium chloride 20 MEQ TBCR, Take 20 mEq by mouth daily., Disp: 30 tablet, Rfl: 6 .  ranitidine (ZANTAC) 150 MG tablet, Take 150 mg by mouth 2 (two) times daily as needed for heartburn. Uses [employer] brand, Disp: , Rfl:    Allergies: No Known Allergies  Social History   Socioeconomic History  . Marital status: Divorced    Spouse name: [name]  . Number of children: [name]  . Years of education: [name]  . Highest education level: [name]  Social Needs  . Financial resource strain: [name]  . Food insecurity - worry: [name]  . Food insecurity - inability: [name]  . Transportation needs - medical: [name]  . Transportation needs - non-medical: [name]  Occupational History  . [name]  Tobacco Use  . Smoking status: Former Smoker    Types: Cigarettes    Last attempt to quit: 12/11/2016    Years since quitting: 0.0  . Smokeless tobacco: Never Used  Substance and Sexual Activity  . Alcohol use: No    Frequency: Never  . Drug use: No  . Sexual activity: [name]  Other Topics Concern  . [name]  Social History Narrative  . [name]     Family History  Problem Relation Age of Onset  . Heart disease Mother      Review of Systems: All other systems reviewed and are otherwise negative except as noted above.  Physical Exam: Vitals:   01/02/17 0300  01/02/17 0400 01/02/17 0600 01/02/17 0700  BP: 108/65 (!) 104/44 (!) 117/45 134/63  Pulse: 71  74 (!) 49  Resp: 14 (!) 8 (!) 23 14  Temp:      TempSrc:      SpO2: 93%  (!) 86% 94%  Weight:      Height:        GEN- The patient is chronically ill appearing, alert and oriented x 3 today, sleeping but arouses.   HEENT: normocephalic, atraumatic; sclera clear, conjunctiva pink; hearing intact; oropharynx clear; neck supple Lungs- Clear to ausculation bilaterally, normal work of breathing.  No wheezes, rales, rhonchi Heart- Regular rate and rhythm, frequent ectopy GI- soft, non-tender, non-distended, bowel sounds present Extremities- no clubbing, cyanosis, or edema MS- no significant deformity or atrophy Skin- warm and  dry, no rash or lesion Psych- euthymic mood, full affect Neuro- strength and sensation are intact  Labs:  Lab Results  Component Value Date   WBC 10.4 01/01/2017   HGB 14.5 01/01/2017   HCT 42.3 01/01/2017   MCV 94.4 01/01/2017   PLT 164 01/01/2017   Recent Labs  Lab 01/01/17 1607 01/01/17 2306  NA 136  --   K 4.8  --   CL 104  --   CO2 23  --   BUN 21*  --   CREATININE 1.26* 1.28*  CALCIUM 8.8*  --   PROT  --  4.6*  BILITOT  --  1.1  ALKPHOS  --  117  ALT  --  30  AST  --  22  GLUCOSE 209*  --       Radiology/Studies: Dg Chest Portable 1 View  Result Date: 01/01/2017 CLINICAL DATA:  71 year old male with bradycardia. EXAM: PORTABLE CHEST 1 VIEW COMPARISON:  12/18/2016 and earlier. FINDINGS: Portable AP semi upright view at at 1656 hours. Resuscitation or pacing pads project over the central and left chest. Stable cardiomegaly and mediastinal contours. Stable lung volumes. Allowing for portable technique the lungs are clear. Visualized tracheal air column is within normal limits. No acute osseous abnormality identified. Prior sternotomy. IMPRESSION: No acute cardiopulmonary abnormality. Electronically Signed   By: Genevie Ann M.D.   On: 01/01/2017 17:24    Dg Chest Port 1 View  Result Date: 12/18/2016 CLINICAL DATA:  71 year old male status post STEMI. Status post intubation and cardiac catheterization. EXAM: PORTABLE CHEST 1 VIEW COMPARISON:  12/17/2016 and earlier. FINDINGS: Portable AP semi upright view at 0527 hours. Extubated. Enteric tube removed. Stable lung volumes. Stable cardiac size and mediastinal contours. No pneumothorax. Further decreased pulmonary vascularity improved left lung base ventilation. Mild nonspecific patchy opacity at the right lung base. No definite pleural effusion. IMPRESSION: 1. Extubated and enteric tube removed.  Stable lung volumes. 2. Further regression of pulmonary vascularity, and improved left lung base ventilation. 3. Mild patchy opacity at the right lung base, favor atelectasis. Electronically Signed   By: Genevie Ann M.D.   On: 12/18/2016 08:37   Portable Chest Xray  Result Date: 12/17/2016 CLINICAL DATA:  Intubation. EXAM: PORTABLE CHEST 1 VIEW COMPARISON:  12/16/2016. FINDINGS: Endotracheal tube and NG tube in stable position. Prior median sternotomy. Cardiomegaly with normal pulmonary vascularity. Interim improvement of interstitial prominence suggesting improving CHF. No prominent pleural effusion. Costophrenic angles incompletely imaged. No pneumothorax identified. IMPRESSION: 1.  Endotracheal tube and NG tube in stable position. 2. Prior CABG. Stable cardiomegaly. Interim improvement of interstitial prominence suggesting improving CHF. Electronically Signed   By: Marcello Moores  Register   On: 12/17/2016 08:42   Dg Chest Port 1 View  Result Date: 12/16/2016 CLINICAL DATA:  Endotracheal tube placement. EXAM: PORTABLE CHEST 1 VIEW COMPARISON:  None. FINDINGS: Mild cardiomegaly is noted. Endotracheal tube is seen projected over tracheal air shadow with distal tip 7 cm above the carina. Nasogastric tube is seen entering the stomach. No pneumothorax or pleural effusion is noted. Mild central pulmonary vascular congestion  and possible perihilar edema is noted. Bony thorax is unremarkable. IMPRESSION: Endotracheal and nasogastric tubes in grossly good position. Mild cardiomegaly with central pulmonary vascular congestion is noted. Possible bilateral perihilar edema. Electronically Signed   By: Marijo Conception, M.D.   On: 12/16/2016 18:24    DGL:OVFIE rhythm, runs NSVT (personally reviewed)  TELEMETRY: sinus rhythm, PVC's (personally reviewed)  Assessment/Plan: 1.  PVC's/NSVT Asymptomatic Improved  on IV amiodarone Would continue for today and change to po tomorrow Continue BB Keep K>3.9, Mg >1.8  2.  ICM/CAD/Chronic systolic heart failure Euvolemic on exam Continue current therapy Optimize meds as able Repeat echo 3 months, if EF remains depressed, consider primary prevention ICD  3.  Burning with urination Seems to be patient's biggest concern Urine culture pending  Dr Curt Bears to see later today   Signed, Chanetta Marshall 01/02/2017 9:44 AM  I have seen and examined this patient with Chanetta Marshall.  Agree with above, note added to reflect my findings.  On exam, RRR, no murmurs, lungs clear.   The hospital initially with weakness and fatigue.  Found to have multiple PVCs and nonsustained ventricular tachycardia.  Does have a history of an ischemic cardiomyopathy and coronary disease.  He has had a CABG in the past.  He was started on amiodarone IV last night which has significantly decreased his PVC and VT burden.  We will plan to continue the IV amiodarone for 24 more hours and switch him to p.o. at that time.  Would have him follow-up in EP clinic in 3 months with an echo just prior for evaluation of an ICD.  At this time, as he is not had syncope with his nonsustained VT, would hold off on ICD implantation.  Will M. Camnitz MD 01/02/2017 10:32 AM

## 2017-01-02 NOTE — Progress Notes (Signed)
Progress Note  Patient Name: Luis Donaldson Date of Encounter: 01/02/2017  Primary Cardiologist: No primary care provider on file.   Subjective   He feels best in a long time since he slept well, denies chest pain or SOB, no palpitations, no dizziness.   Inpatient Medications    Scheduled Meds: . aspirin EC  81 mg Oral Daily  . atorvastatin  80 mg Oral q1800  . furosemide  40 mg Oral Daily  . heparin  5,000 Units Subcutaneous Q8H  . lisinopril  5 mg Oral Daily  . metoprolol tartrate  25 mg Oral BID  . mirtazapine  7.5 mg Oral QHS  . [START ON 01/03/2017] potassium chloride  20 mEq Oral Daily   Continuous Infusions: . amiodarone 30 mg/hr (01/02/17 0954)   PRN Meds: acetaminophen, nitroGLYCERIN, nitroGLYCERIN, ondansetron (ZOFRAN) IV   Vital Signs    Vitals:   01/02/17 0930 01/02/17 0945 01/02/17 1000 01/02/17 1002  BP: (!) 130/45  (!) 153/64 (!) 153/64  Pulse:  (!) 51    Resp:  11    Temp:      TempSrc:      SpO2:  95%    Weight:      Height:       No intake or output data in the 24 hours ending 01/02/17 1022 Filed Weights   01/01/17 1601  Weight: 203 lb (92.1 kg)    Telemetry    Baseline rhrhtm sinus bradycardia with HR 46-58 BPM, frequent PVCs - Personally Reviewed  Physical Exam   GEN: No acute distress.   Neck: No JVD Cardiac: RRR, no murmurs, rubs, or gallops.  Respiratory: Clear to auscultation bilaterally. GI: Soft, nontender, non-distended  MS: No edema; No deformity. Neuro:  Nonfocal  Psych: Normal affect   Labs    Chemistry Recent Labs  Lab 01/01/17 1607 01/01/17 2306  NA 136  --   K 4.8  --   CL 104  --   CO2 23  --   GLUCOSE 209*  --   BUN 21*  --   CREATININE 1.26* 1.28*  CALCIUM 8.8*  --   PROT  --  4.6*  ALBUMIN  --  3.0*  AST  --  22  ALT  --  30  ALKPHOS  --  117  BILITOT  --  1.1  GFRNONAA 56* 55*  GFRAA >60 >60  ANIONGAP 9  --      Hematology Recent Labs  Lab 01/01/17 1607 01/01/17 2306  WBC 9.7 10.4    RBC 4.75 4.48  HGB 15.0 14.5  HCT 44.6 42.3  MCV 93.9 94.4  MCH 31.6 32.4  MCHC 33.6 34.3  RDW 13.2 13.6  PLT 198 164    Cardiac Enzymes Recent Labs  Lab 01/01/17 1607 01/01/17 2306 01/02/17 0452  TROPONINI 0.03* <0.03 0.03*    Recent Labs  Lab 01/01/17 1616  TROPIPOC 0.05     BNPNo results for input(s): BNP, PROBNP in the last 168 hours.   DDimer No results for input(s): DDIMER in the last 168 hours.   Radiology    Dg Chest Portable 1 View  Result Date: 01/01/2017 CLINICAL DATA:  71 year old male with bradycardia. EXAM: PORTABLE CHEST 1 VIEW COMPARISON:  12/18/2016 and earlier. FINDINGS: Portable AP semi upright view at at 1656 hours. Resuscitation or pacing pads project over the central and left chest. Stable cardiomegaly and mediastinal contours. Stable lung volumes. Allowing for portable technique the lungs are clear. Visualized tracheal air column  is within normal limits. No acute osseous abnormality identified. Prior sternotomy. IMPRESSION: No acute cardiopulmonary abnormality. Electronically Signed   By: Genevie Ann M.D.   On: 01/01/2017 17:24    Cardiac Studies   TTE: 12/17/16  - LVEF 25-30%, moderate LVH with anterior wall thinning and   enhanced echogenicity suggestive of infarct, there is anterior,   apical and mid to distal inferoapical akinesis suggestive of   extensive LAD territroy infarct, inferior and lateral wall   hypokinesis also noted. grade 1 DD, biventricular filling   pressures are elevated.   Patient Profile     71 y.o. male   Assessment & Plan    nsVT  Telemetry shows improvement of sinus rhythm with ery frequent nsVTs lasting 3-7 beats, after starting iv amiodarone then SR and frequent PVCs in a pattern of bigeminy, to SR with less frequent PVCs. Baseline rhrhtm sinus bradycardia with HR 46-58 BPM - he is asymptomatic - continue amiodarone drip, timing of switching to PO amiodarone per EP - continue low dose carvedilol 3.125 mg PO  BID - EP consult today  CAD (coronary artery disease) Remote PCI in the 90's, CABG in 2000 at [facility]. - occluded SVG to RCA with L-R collaterals, patent free radial to OM with 60% distal OM1, Patent LIMA to LAD. Plan is for medical Rx (no PCI done)  HLD (hyperlipidemia) High dose statin Rx added  Acute systolic CHF (congestive heart failure) (HCC) Pt appears euvolemic  Dysuria, Polyuria - U/A no UTI,  urine cultures are pending  DVT PPX - Lovenox sq  For questions or updates, please contact [facility] Please consult [email or web address] for contact info under Cardiology/STEMI.      Signed, Ena Dawley, MD  01/02/2017, 10:22 AM

## 2017-01-02 NOTE — ED Notes (Signed)
Given diet coke  

## 2017-01-03 ENCOUNTER — Encounter: Payer: Self-pay | Admitting: *Deleted

## 2017-01-03 DIAGNOSIS — Z951 Presence of aortocoronary bypass graft: Secondary | ICD-10-CM

## 2017-01-03 DIAGNOSIS — I25708 Atherosclerosis of coronary artery bypass graft(s), unspecified, with other forms of angina pectoris: Secondary | ICD-10-CM

## 2017-01-03 DIAGNOSIS — I255 Ischemic cardiomyopathy: Secondary | ICD-10-CM

## 2017-01-03 LAB — URINE CULTURE: Culture: <10000 — AB

## 2017-01-03 MED ORDER — AMIODARONE HCL 200 MG PO TABS: 200.0000 mg | ORAL_TABLET | Freq: Every day | ORAL | Status: DC

## 2017-01-03 MED ORDER — AMIODARONE HCL 200 MG PO TABS
400.0000 mg | ORAL_TABLET | Freq: Two times a day (BID) | ORAL | Status: DC
  Administered 2017-01-03 – 2017-01-04 (×3): 400 mg via ORAL
  Filled 2017-01-03 (×3): qty 2

## 2017-01-03 MED ORDER — LOSARTAN POTASSIUM 50 MG PO TABS
50.0000 mg | ORAL_TABLET | Freq: Every day | ORAL | Status: DC
  Administered 2017-01-04: 50 mg via ORAL
  Filled 2017-01-03: qty 1

## 2017-01-03 MED ORDER — AMIODARONE HCL 200 MG PO TABS: 200.0000 mg | ORAL_TABLET | Freq: Two times a day (BID) | ORAL | Status: DC

## 2017-01-03 NOTE — Progress Notes (Signed)
Progress Note  Patient Name: Luis Donaldson Date of Encounter: 01/03/2017  Primary Cardiologist: No primary care provider on file.   Subjective   He feels great, denies chest pain or SOB, no palpitations, no dizziness. Also dysuria and diarrhea have resolved.  Inpatient Medications    Scheduled Meds: . amiodarone  400 mg Oral BID   Followed by  . [START ON 01/06/2017] amiodarone  200 mg Oral BID   Followed by  . [START ON 01/09/2017] amiodarone  200 mg Oral Daily  . aspirin EC  81 mg Oral Daily  . atorvastatin  80 mg Oral q1800  . furosemide  40 mg Oral Daily  . heparin  5,000 Units Subcutaneous Q8H  . lisinopril  5 mg Oral Daily  . metoprolol tartrate  25 mg Oral BID  . mirtazapine  7.5 mg Oral QHS  . potassium chloride  20 mEq Oral Daily   Continuous Infusions:  PRN Meds: acetaminophen, nitroGLYCERIN, ondansetron (ZOFRAN) IV   Vital Signs    Vitals:   01/03/17 0417 01/03/17 0442 01/03/17 0808 01/03/17 1001  BP: (!) 145/44   (!) 159/63  Pulse: (!) 29  (!) 55 (!) 58  Resp: 20  15   Temp: (!) 97.5 F (36.4 C)  98.3 F (36.8 C)   TempSrc: Oral  Oral   SpO2: 97%  94%   Weight: 206 lb 2.1 oz (93.5 kg) 203 lb 4.2 oz (92.2 kg)    Height:        Intake/Output Summary (Last 24 hours) at 01/03/2017 1148 Last data filed at 01/03/2017 0306 Gross per 24 hour  Intake 431.62 ml  Output 950 ml  Net -518.38 ml   Filed Weights   01/02/17 1535 01/03/17 0417 01/03/17 0442  Weight: 203 lb 14.8 oz (92.5 kg) 206 lb 2.1 oz (93.5 kg) 203 lb 4.2 oz (92.2 kg)    Telemetry    Baseline rhrhtm sinus bradycardia with HR 46-58 BPM, frequent PVCs - Personally Reviewed  Physical Exam   GEN: No acute distress.   Neck: No JVD Cardiac: RRR, no murmurs, rubs, or gallops.  Respiratory: Clear to auscultation bilaterally. GI: Soft, nontender, non-distended  MS: No edema; No deformity. Neuro:  Nonfocal  Psych: Normal affect   Labs    Chemistry Recent Labs  Lab 01/01/17 1607  01/01/17 2306 01/02/17 1008  NA 136  --  137  K 4.8  --  4.0  CL 104  --  107  CO2 23  --  21*  GLUCOSE 209*  --  180*  BUN 21*  --  19  CREATININE 1.26* 1.28* 1.14  CALCIUM 8.8*  --  8.4*  PROT  --  4.6*  --   ALBUMIN  --  3.0*  --   AST  --  22  --   ALT  --  30  --   ALKPHOS  --  117  --   BILITOT  --  1.1  --   GFRNONAA 56* 55* >60  GFRAA >60 >60 >60  ANIONGAP 9  --  9     Hematology Recent Labs  Lab 01/01/17 1607 01/01/17 2306  WBC 9.7 10.4  RBC 4.75 4.48  HGB 15.0 14.5  HCT 44.6 42.3  MCV 93.9 94.4  MCH 31.6 32.4  MCHC 33.6 34.3  RDW 13.2 13.6  PLT 198 164    Cardiac Enzymes Recent Labs  Lab 01/01/17 1607 01/01/17 2306 01/02/17 0452 01/02/17 1008  TROPONINI 0.03* <0.03 0.03* <  0.03    Recent Labs  Lab 01/01/17 1616  TROPIPOC 0.05     BNPNo results for input(s): BNP, PROBNP in the last 168 hours.   DDimer No results for input(s): DDIMER in the last 168 hours.   Radiology    Dg Chest Portable 1 View  Result Date: 01/01/2017 CLINICAL DATA:  71 year old male with bradycardia. EXAM: PORTABLE CHEST 1 VIEW COMPARISON:  12/18/2016 and earlier. FINDINGS: Portable AP semi upright view at at 1656 hours. Resuscitation or pacing pads project over the central and left chest. Stable cardiomegaly and mediastinal contours. Stable lung volumes. Allowing for portable technique the lungs are clear. Visualized tracheal air column is within normal limits. No acute osseous abnormality identified. Prior sternotomy. IMPRESSION: No acute cardiopulmonary abnormality. Electronically Signed   By: Genevie Ann M.D.   On: 01/01/2017 17:24    Cardiac Studies   TTE: 12/17/16  - LVEF 25-30%, moderate LVH with anterior wall thinning and   enhanced echogenicity suggestive of infarct, there is anterior,   apical and mid to distal inferoapical akinesis suggestive of   extensive LAD territroy infarct, inferior and lateral wall   hypokinesis also noted. grade 1 DD, biventricular  filling   pressures are elevated.   Patient Profile     71 y.o. male   Assessment & Plan    nsVT  Telemetry shows improvement of sinus rhythm with less frequent episodes of bigeminy, no more runs of nsVTs, alternating with sinus bradycardia only. - we will continue loading with amiodarone, switch to PO today -Baseline rhythtm sinus bradycardia with HR in 50' BPM - continue low dose carvedilol 3.125 mg PO BID  Ischemic cardiomyopathy with LVEF 25-30% - he is hypertensive, switch lisinopril to losartan 50 mg po daily starting tomorrow with a plan to switching to [facility], possibly in the outpatient settings   CAD (coronary artery disease) Remote PCI in the 90's, CABG in 2000 at [facility]. - occluded SVG to RCA with L-R collaterals, patent free radial to OM with 60% distal OM1, Patent LIMA to LAD. Plan is for medical Rx (no PCI done)  HLD (hyperlipidemia) High dose statin Rx added  Acute systolic CHF (congestive heart failure) (HCC) Pt appears euvolemic  Dysuria, Polyuria - U/A no UTI,  urine cultures are pending  DVT PPX - Lovenox sq  Plan: switch to PO amiodarone, transfer to telemetry, mobilize, anticipated discharge tomorrow. Schedule an early outpatient follow up.  For questions or updates, please contact [place] Please consult [email or web address] for contact info under Cardiology/STEMI.      Signed, Ena Dawley, MD  01/03/2017, 11:48 AM

## 2017-01-03 NOTE — Progress Notes (Signed)
[facility]  Patient Status: Active (receiving services up to time of hospitalization)  [facility] is providing the following services: PT and OT  Would benefit from RN at home. Please order if you agree.  If patient discharges after hours, please call [phone].   Luis Donaldson 01/03/2017, 10:41 AM

## 2017-01-03 NOTE — Clinical Social Work Note (Signed)
CSW acknowledges consult "Trouble obtaining home meds regularly bc of financial reasons." Will notify RNCM in morning progression.  CSW signing off. Consult again if any social work needs arise.  Dayton Scrape, [place]

## 2017-01-04 MED ORDER — AMIODARONE HCL 200 MG PO TABS: ORAL_TABLET | ORAL | 0 refills | Status: DC

## 2017-01-04 MED ORDER — LOSARTAN POTASSIUM 50 MG PO TABS: 50.0000 mg | ORAL_TABLET | Freq: Every day | ORAL | 6 refills | Status: DC

## 2017-01-04 NOTE — Care Management (Signed)
Resumption orders placed for [facility] PT and OT.  Previously with [facility].  [facility] aware.  Pt's new medications on [facility] $4 list.  Pt has used [facility] in the past but not open on weekend.  Pt agrees to use [facility].  RN advised to have scripts sent to [facility] on [address] per pt request.

## 2017-01-04 NOTE — Discharge Summary (Signed)
Discharge Summary    Patient ID: Luis Donaldson,  MRN: [id number], DOB/AGE: 71-Mar-1947 71 y.o.  Admit date: 01/01/2017 Discharge date: 01/04/2017  Primary Care Provider: Benito Mccreedy Primary Cardiologist: Dr. Martinique  EP: Dr. Curt Bears  Discharge Diagnoses    Active Problems:   NSVT (nonsustained ventricular tachycardia) (HCC)   S/P CABG (coronary artery bypass graft)   Ischemic cardiomyopathy  CAD  HLD  Acute systolic CHF Dysuria, polyuria  Allergies No Known Allergies  Diagnostic Studies/Procedures    None this admission   History of Present Illness     Luis Donaldson is a 71 y.o. male with a history of CAD remote PCI in the 66's at [facility] thenCABG in 2000at [facility], [place], chronic systolic heart failure  presented to [facility] ER 12/12 with bradycardia.   He was not seeing any provider and had stooped all his medication for at least a year. His explanation was a combination of doing well, cost, and side effects. Hepresented to [facility] 11/26/18with chest pain,respiratory failure,hypertensive emergency, persistent runs of NSVT,and what was thought to be STEMI with ST elevation in V1-V2.He was intubated and an IO line was placed. Heunderwent emergent heart cath which did not reveal culprit lesion; it showed 2 patent grafts and chronic RCA graft occlusion with collaterals. His EF was 25-30% with grade 1 DD. Medical rx was resume. He was discharged 12/19/16 and is seen in f/u by Kerin Ransom on 12/25/2016. His main complaint was generalized weakness. He denied chest pain or SOB.He was doing ok, his medications were not changed.   He presented today after he called the clinic and complained of frequent urination, orthopnea, SOB at rest. He was advised to go to the ER. Noted bradycardia. The patient is asymptomatic, no dizziness or syncope and BP is normal.   Hospital Course     Consultants: EP  1. NSVT - Telemetry showed underlying sinus bradycardia to sinus rhythm with very  frequent NSVTs lasting 3-7 beats, sometimes almost after every single sinus eat.  Started amiodarone drip and continued low dose carvedilol 3.125 mg PO BID. Seen by EP and recommend continue amiodarone given decreased in PVC and VT burden.  Normal thyroid function test. Would discharge on 400 mg twice a day for 2 weeks followed by 200 mg twice a day for 2 weeks followed by 200 mg a day.  He Luis Donaldson likely need a defibrillator in the future, but this can be discussed as an outpatient.    2. Ischemic cardiomyopathy with LVEF 86-76%/HMCNO systolic CHF - Net I & O negative 1.7L. Weigh down 3 lb (203-->200lb). Currently on losartan.  We Luis Donaldson likely switch to [facility] as an outpatient.  He Luis Donaldson likely need an ICD which Luis Donaldson be discussed in the outpatient setting. Continue BB.   3. CAD (coronary artery disease) CABG in 2000 with remote PCI in the 90s.  No current chest pain.  Continue medical management.    4. HLD (hyperlipidemia) Continue statin  5. Dysuria, Polyuria Urine culture is negative.  The patient has been seen by Dr. Curt Bears  today and deemed ready for discharge home. All follow-up appointments have been scheduled. Discharge medications are listed below.   Discharge Vitals Blood pressure (!) 113/52, pulse (!) 58, temperature 97.7 F (36.5 C), temperature source Oral, resp. rate 18, height 5\' 9"  (1.753 m), weight 200 lb 1.6 oz (90.8 kg), SpO2 98 %.  Filed Weights   01/03/17 0442 01/03/17 1505 01/04/17 0531  Weight: 203 lb 4.2 oz (  92.2 kg) 204 lb 1.6 oz (92.6 kg) 200 lb 1.6 oz (90.8 kg)    Labs & Radiologic Studies     CBC Recent Labs    01/01/17 1607 01/01/17 2306  WBC 9.7 10.4  HGB 15.0 14.5  HCT 44.6 42.3  MCV 93.9 94.4  PLT 198 856   Basic Metabolic Panel Recent Labs    01/01/17 1607 01/01/17 2306 01/02/17 1008  NA 136  --  137  K 4.8  --  4.0  CL 104  --  107  CO2 23  --  21*  GLUCOSE 209*  --  180*  BUN 21*  --  19  CREATININE 1.26* 1.28* 1.14  CALCIUM  8.8*  --  8.4*  MG 2.1 2.1  --    Liver Function Tests Recent Labs    01/01/17 2306  AST 22  ALT 30  ALKPHOS 117  BILITOT 1.1  PROT 4.6*  ALBUMIN 3.0*   No results for input(s): LIPASE, AMYLASE in the last 72 hours. Cardiac Enzymes Recent Labs    01/01/17 2306 01/02/17 0452 01/02/17 1008  TROPONINI <0.03 0.03* <0.03   Thyroid Function Tests Recent Labs    01/01/17 2306  TSH 3.479    Dg Chest Portable 1 View  Result Date: 01/01/2017 CLINICAL DATA:  71 year old male with bradycardia. EXAM: PORTABLE CHEST 1 VIEW COMPARISON:  12/18/2016 and earlier. FINDINGS: Portable AP semi upright view at at 1656 hours. Resuscitation or pacing pads project over the central and left chest. Stable cardiomegaly and mediastinal contours. Stable lung volumes. Allowing for portable technique the lungs are clear. Visualized tracheal air column is within normal limits. No acute osseous abnormality identified. Prior sternotomy. IMPRESSION: No acute cardiopulmonary abnormality. Electronically Signed   By: Genevie Ann M.D.   On: 01/01/2017 17:24   Dg Chest Port 1 View  Result Date: 12/18/2016 CLINICAL DATA:  71 year old male status post STEMI. Status post intubation and cardiac catheterization. EXAM: PORTABLE CHEST 1 VIEW COMPARISON:  12/17/2016 and earlier. FINDINGS: Portable AP semi upright view at 0527 hours. Extubated. Enteric tube removed. Stable lung volumes. Stable cardiac size and mediastinal contours. No pneumothorax. Further decreased pulmonary vascularity improved left lung base ventilation. Mild nonspecific patchy opacity at the right lung base. No definite pleural effusion. IMPRESSION: 1. Extubated and enteric tube removed.  Stable lung volumes. 2. Further regression of pulmonary vascularity, and improved left lung base ventilation. 3. Mild patchy opacity at the right lung base, favor atelectasis. Electronically Signed   By: Genevie Ann M.D.   On: 12/18/2016 08:37   Portable Chest Xray  Result  Date: 12/17/2016 CLINICAL DATA:  Intubation. EXAM: PORTABLE CHEST 1 VIEW COMPARISON:  12/16/2016. FINDINGS: Endotracheal tube and NG tube in stable position. Prior median sternotomy. Cardiomegaly with normal pulmonary vascularity. Interim improvement of interstitial prominence suggesting improving CHF. No prominent pleural effusion. Costophrenic angles incompletely imaged. No pneumothorax identified. IMPRESSION: 1.  Endotracheal tube and NG tube in stable position. 2. Prior CABG. Stable cardiomegaly. Interim improvement of interstitial prominence suggesting improving CHF. Electronically Signed   By: Marcello Moores  Register   On: 12/17/2016 08:42   Dg Chest Port 1 View  Result Date: 12/16/2016 CLINICAL DATA:  Endotracheal tube placement. EXAM: PORTABLE CHEST 1 VIEW COMPARISON:  None. FINDINGS: Mild cardiomegaly is noted. Endotracheal tube is seen projected over tracheal air shadow with distal tip 7 cm above the carina. Nasogastric tube is seen entering the stomach. No pneumothorax or pleural effusion is noted. Mild central pulmonary vascular congestion and  possible perihilar edema is noted. Bony thorax is unremarkable. IMPRESSION: Endotracheal and nasogastric tubes in grossly good position. Mild cardiomegaly with central pulmonary vascular congestion is noted. Possible bilateral perihilar edema. Electronically Signed   By: Marijo Conception, M.D.   On: 12/16/2016 18:24    Disposition   Pt is being discharged home today in good condition.  Follow-up Plans & Appointments    Follow-up Information    Martinique, Peter M, MD. Go on 01/30/2017.   Specialty:  Cardiology Why:  @10am  for follow up Contact information: [address] [place] [address] [phone]          Discharge Instructions    Diet - low sodium heart healthy   Complete by:  As directed    Discharge instructions   Complete by:  As directed    *Weigh yourself on the same scale at same time of day and keep a log. *Report  weight gain of > 3 lbs in 1 day or 5 lbs over the course of a week and/or symptoms of excess fluid (shortness of breath, difficulty lying flat, swelling, poor appetite, abdominal fullness/bloating, etc) to your doctor immediately. *Avoid foods that are high in sodium (processed, pre-packaged/canned goods, fast foods, etc). *Please attend all scheduled and reccommended follow up appointments   Increase activity slowly   Complete by:  As directed       Discharge Medications   Allergies as of 01/04/2017   No Known Allergies     Medication List    STOP taking these medications   lisinopril 5 MG tablet Commonly known as:  PRINIVIL,ZESTRIL     TAKE these medications   amiodarone 200 MG tablet Commonly known as:  PACERONE Take 400 mg twice a day for 2 weeks followed by 200 mg twice a day for 2 weeks followed by 200 mg a day by mouth   aspirin 81 MG EC tablet Take 1 tablet (81 mg total) by mouth daily.   atorvastatin 80 MG tablet Commonly known as:  LIPITOR Take 1 tablet (80 mg total) by mouth daily at 6 PM.   furosemide 40 MG tablet Commonly known as:  LASIX Take 1 tablet (40 mg total) by mouth daily.   losartan 50 MG tablet Commonly known as:  COZAAR Take 1 tablet (50 mg total) by mouth daily. Start taking on:  01/05/2017   metoprolol tartrate 25 MG tablet Commonly known as:  LOPRESSOR Take 1 tablet (25 mg total) by mouth 2 (two) times daily.   nitroGLYCERIN 0.4 MG SL tablet Commonly known as:  NITROSTAT Place 1 tablet (0.4 mg total) under the tongue every 5 (five) minutes x 3 doses as needed for chest pain.   Potassium Chloride ER 20 MEQ Tbcr Take 20 mEq by mouth daily.   ranitidine 150 MG tablet Commonly known as:  ZANTAC Take 150 mg by mouth 2 (two) times daily as needed for heartburn. Uses [employer] brand         Outstanding Labs/Studies   None  Duration of Discharge Encounter   Greater than 30 minutes including physician time.  Signed, Crista Luria  Bhagat PA-C 01/04/2017, 11:59 AM   I have seen and examined this patient with Vin Bhagat.  Agree with above, note added to reflect my findings.  On exam, RRR, no murmurs, lungs clear.  Presented to the hospital with nonsustained VT.  Was put on IV amiodarone.  Ventricular ectopy greatly improved.  He was switched to p.o. amiodarone and has tolerated  this well.  Plan for discharge today with follow-up in cardiology clinic.  Alli Jasmer M. Meilani Edmundson MD 01/04/2017 12:02 PM

## 2017-01-04 NOTE — Progress Notes (Addendum)
Reviewed discharge instructions/medications with pt. Answered his questions. Pt is stable and ready for discharge. Was able to get Dr. To change medications to be picked up at [facility] on [address] in [place].

## 2017-01-04 NOTE — Progress Notes (Signed)
Progress Note  Patient Name: Luis Donaldson Date of Encounter: 01/04/2017  Primary Cardiologist: No primary care provider on file.   Subjective   Feeling well.  No chest pain, shortness of breath.  Has not noted any further episodes of palpitations.  Inpatient Medications    Scheduled Meds: . amiodarone  400 mg Oral BID   Followed by  . [START ON 01/06/2017] amiodarone  200 mg Oral BID   Followed by  . [START ON 01/09/2017] amiodarone  200 mg Oral Daily  . aspirin EC  81 mg Oral Daily  . atorvastatin  80 mg Oral q1800  . furosemide  40 mg Oral Daily  . heparin  5,000 Units Subcutaneous Q8H  . losartan  50 mg Oral Daily  . metoprolol tartrate  25 mg Oral BID  . mirtazapine  7.5 mg Oral QHS  . potassium chloride  20 mEq Oral Daily   Continuous Infusions:  PRN Meds: acetaminophen, nitroGLYCERIN, ondansetron (ZOFRAN) IV   Vital Signs    Vitals:   01/03/17 1926 01/04/17 0004 01/04/17 0531 01/04/17 0912  BP: (!) 122/97 139/65 (!) 134/59 (!) 113/52  Pulse: 61 (!) 56 (!) 56 (!) 58  Resp: 18  18   Temp: 97.9 F (36.6 C) 98.4 F (36.9 C) 97.7 F (36.5 C)   TempSrc: Oral Oral Oral   SpO2: 97% 96% 94% 98%  Weight:   200 lb 1.6 oz (90.8 kg)   Height:        Intake/Output Summary (Last 24 hours) at 01/04/2017 1111 Last data filed at 01/04/2017 1779 Gross per 24 hour  Intake 818.45 ml  Output 1825 ml  Net -1006.55 ml   Filed Weights   01/03/17 0442 01/03/17 1505 01/04/17 0531  Weight: 203 lb 4.2 oz (92.2 kg) 204 lb 1.6 oz (92.6 kg) 200 lb 1.6 oz (90.8 kg)    Telemetry    Sinus bradycardia with frequent PVCs- Personally Reviewed  Physical Exam   GEN: Well nourished, well developed, in no acute distress  HEENT: normal  Neck: no JVD, carotid bruits, or masses Cardiac: RRR; no murmurs, rubs, or gallops,no edema  Respiratory:  clear to auscultation bilaterally, normal work of breathing GI: soft, nontender, nondistended, + BS MS: no deformity or atrophy  Skin:  warm and dry Neuro:  Strength and sensation are intact Psych: euthymic mood, full affect   Labs    Chemistry Recent Labs  Lab 01/01/17 1607 01/01/17 2306 01/02/17 1008  NA 136  --  137  K 4.8  --  4.0  CL 104  --  107  CO2 23  --  21*  GLUCOSE 209*  --  180*  BUN 21*  --  19  CREATININE 1.26* 1.28* 1.14  CALCIUM 8.8*  --  8.4*  PROT  --  4.6*  --   ALBUMIN  --  3.0*  --   AST  --  22  --   ALT  --  30  --   ALKPHOS  --  117  --   BILITOT  --  1.1  --   GFRNONAA 56* 55* >60  GFRAA >60 >60 >60  ANIONGAP 9  --  9     Hematology Recent Labs  Lab 01/01/17 1607 01/01/17 2306  WBC 9.7 10.4  RBC 4.75 4.48  HGB 15.0 14.5  HCT 44.6 42.3  MCV 93.9 94.4  MCH 31.6 32.4  MCHC 33.6 34.3  RDW 13.2 13.6  PLT 198 164  Cardiac Enzymes Recent Labs  Lab 01/01/17 1607 01/01/17 2306 01/02/17 0452 01/02/17 1008  TROPONINI 0.03* <0.03 0.03* <0.03    Recent Labs  Lab 01/01/17 1616  TROPIPOC 0.05     BNPNo results for input(s): BNP, PROBNP in the last 168 hours.   DDimer No results for input(s): DDIMER in the last 168 hours.   Radiology    No results found.  Cardiac Studies   TTE: 12/17/16  - LVEF 25-30%, moderate LVH with anterior wall thinning and   enhanced echogenicity suggestive of infarct, there is anterior,   apical and mid to distal inferoapical akinesis suggestive of   extensive LAD territroy infarct, inferior and lateral wall   hypokinesis also noted. grade 1 DD, biventricular filling   pressures are elevated.   Patient Profile     71 y.o. male   Assessment & Plan    nsVT  Treated with improvement of ventricular ectopy on amiodarone.  Has been switched to p.o. amiodarone.  Plan for discharge today.  Would discharge on 400 mg twice a day for 2 weeks followed by 200 mg twice a day for 2 weeks followed by 200 mg a day.  He Alazar Cherian likely need a defibrillator in the future, but this can be discussed as an outpatient.    Ischemic cardiomyopathy  with LVEF 25-30% Blood pressure well controlled today.  Currently on losartan.  We Edinson Domeier likely switch to [facility] as an outpatient.  He Lively Haberman likely need an ICD which Shantale Holtmeyer be discussed in the outpatient setting.  CAD (coronary artery disease) CABG in 2000 with remote PCI in the 90s.  No current chest pain.  Continue medical management.    HLD (hyperlipidemia) Continue statin  Acute systolic CHF (congestive heart failure) (HCC) Appears euvolemic  Dysuria, Polyuria Urine culture is negative.  DVT PPX - Lovenox sq  Plan: Plan for discharge today on p.o. amiodarone.  Follow-up in cardiology clinic.  For questions or updates, please contact [place] Please consult [email or web address] for contact info under Cardiology/STEMI.      Signed, Jessamine Barcia Meredith Leeds, MD  01/04/2017, 11:11 AM

## 2017-01-07 ENCOUNTER — Telehealth: Payer: Self-pay | Admitting: Cardiology

## 2017-01-07 ENCOUNTER — Telehealth (HOSPITAL_COMMUNITY): Payer: Self-pay

## 2017-01-07 ENCOUNTER — Encounter (HOSPITAL_COMMUNITY): Payer: Self-pay

## 2017-01-07 DIAGNOSIS — I472 Ventricular tachycardia: Secondary | ICD-10-CM | POA: Diagnosis not present

## 2017-01-07 DIAGNOSIS — I110 Hypertensive heart disease with heart failure: Secondary | ICD-10-CM | POA: Diagnosis not present

## 2017-01-07 DIAGNOSIS — I5021 Acute systolic (congestive) heart failure: Secondary | ICD-10-CM | POA: Diagnosis not present

## 2017-01-07 DIAGNOSIS — I2109 ST elevation (STEMI) myocardial infarction involving other coronary artery of anterior wall: Secondary | ICD-10-CM | POA: Diagnosis not present

## 2017-01-07 DIAGNOSIS — E119 Type 2 diabetes mellitus without complications: Secondary | ICD-10-CM | POA: Diagnosis not present

## 2017-01-07 DIAGNOSIS — I2510 Atherosclerotic heart disease of native coronary artery without angina pectoris: Secondary | ICD-10-CM | POA: Diagnosis not present

## 2017-01-07 NOTE — Telephone Encounter (Signed)
She needs a verbal order for Skilled Nursing Consultation and Medication Management please.

## 2017-01-07 NOTE — Telephone Encounter (Signed)
Returned call to [facility] she states that she is at pt's house and pt HE is very low HR 51 bpm and BP is sitting 118/70 and standing 105/65 pt c/o dizziness. Any additional directions? She would also like orders for PT and medication management. Please advise

## 2017-01-07 NOTE — Telephone Encounter (Signed)
We can reduce metoprolol to 12.5 mg bid until his OV. [facility] for PT and skilled nursing consultation.  Peter Martinique MD, [facility]

## 2017-01-07 NOTE — Telephone Encounter (Signed)
2nd attempt to call patient in regards to Cardiac Rehab - Lm on Vm. Sending letter. °

## 2017-01-08 DIAGNOSIS — I2510 Atherosclerotic heart disease of native coronary artery without angina pectoris: Secondary | ICD-10-CM | POA: Diagnosis not present

## 2017-01-08 DIAGNOSIS — I110 Hypertensive heart disease with heart failure: Secondary | ICD-10-CM | POA: Diagnosis not present

## 2017-01-08 DIAGNOSIS — E119 Type 2 diabetes mellitus without complications: Secondary | ICD-10-CM | POA: Diagnosis not present

## 2017-01-08 DIAGNOSIS — I5021 Acute systolic (congestive) heart failure: Secondary | ICD-10-CM | POA: Diagnosis not present

## 2017-01-08 DIAGNOSIS — I2109 ST elevation (STEMI) myocardial infarction involving other coronary artery of anterior wall: Secondary | ICD-10-CM | POA: Diagnosis not present

## 2017-01-08 DIAGNOSIS — I472 Ventricular tachycardia: Secondary | ICD-10-CM | POA: Diagnosis not present

## 2017-01-08 NOTE — Telephone Encounter (Signed)
Returned call to patient Dr.Jordan's recommendations given.Spoke to [place] with [facility] Dr.Jordan's recommendations given.

## 2017-01-09 ENCOUNTER — Telehealth: Payer: Self-pay | Admitting: Cardiology

## 2017-01-09 DIAGNOSIS — I2109 ST elevation (STEMI) myocardial infarction involving other coronary artery of anterior wall: Secondary | ICD-10-CM | POA: Diagnosis not present

## 2017-01-09 DIAGNOSIS — I472 Ventricular tachycardia: Secondary | ICD-10-CM | POA: Diagnosis not present

## 2017-01-09 DIAGNOSIS — I5021 Acute systolic (congestive) heart failure: Secondary | ICD-10-CM | POA: Diagnosis not present

## 2017-01-09 DIAGNOSIS — E119 Type 2 diabetes mellitus without complications: Secondary | ICD-10-CM | POA: Diagnosis not present

## 2017-01-09 DIAGNOSIS — I110 Hypertensive heart disease with heart failure: Secondary | ICD-10-CM | POA: Diagnosis not present

## 2017-01-09 DIAGNOSIS — I2510 Atherosclerotic heart disease of native coronary artery without angina pectoris: Secondary | ICD-10-CM | POA: Diagnosis not present

## 2017-01-09 NOTE — Telephone Encounter (Signed)
Nurse talked to pt

## 2017-01-09 NOTE — Telephone Encounter (Signed)
Returned the call to Ritu. She stated that she would like a verbal to do occupational therapy 2 times a week and wondered if Dr. Martinique would approve this. Will route to him for his knowledge.

## 2017-01-09 NOTE — Telephone Encounter (Signed)
New Message     Ritu with [facility] is calling to get verbal authorization for patient to have occupational therapy. The request is for 2 times a week. Please call Ritu back.

## 2017-01-10 NOTE — Telephone Encounter (Signed)
Ok to approve OT.  Trameka Dorough Martinique MD, [facility]

## 2017-01-10 NOTE — Telephone Encounter (Signed)
Ritu has been made aware of Dr. Doug Sou approval and verbalized her understanding.

## 2017-01-12 DIAGNOSIS — E119 Type 2 diabetes mellitus without complications: Secondary | ICD-10-CM | POA: Diagnosis not present

## 2017-01-12 DIAGNOSIS — I472 Ventricular tachycardia: Secondary | ICD-10-CM | POA: Diagnosis not present

## 2017-01-12 DIAGNOSIS — I110 Hypertensive heart disease with heart failure: Secondary | ICD-10-CM | POA: Diagnosis not present

## 2017-01-12 DIAGNOSIS — I2510 Atherosclerotic heart disease of native coronary artery without angina pectoris: Secondary | ICD-10-CM | POA: Diagnosis not present

## 2017-01-12 DIAGNOSIS — I2109 ST elevation (STEMI) myocardial infarction involving other coronary artery of anterior wall: Secondary | ICD-10-CM | POA: Diagnosis not present

## 2017-01-12 DIAGNOSIS — I5021 Acute systolic (congestive) heart failure: Secondary | ICD-10-CM | POA: Diagnosis not present

## 2017-01-13 DIAGNOSIS — I5021 Acute systolic (congestive) heart failure: Secondary | ICD-10-CM | POA: Diagnosis not present

## 2017-01-13 DIAGNOSIS — I472 Ventricular tachycardia: Secondary | ICD-10-CM | POA: Diagnosis not present

## 2017-01-13 DIAGNOSIS — E119 Type 2 diabetes mellitus without complications: Secondary | ICD-10-CM | POA: Diagnosis not present

## 2017-01-13 DIAGNOSIS — I2510 Atherosclerotic heart disease of native coronary artery without angina pectoris: Secondary | ICD-10-CM | POA: Diagnosis not present

## 2017-01-13 DIAGNOSIS — I110 Hypertensive heart disease with heart failure: Secondary | ICD-10-CM | POA: Diagnosis not present

## 2017-01-13 DIAGNOSIS — I2109 ST elevation (STEMI) myocardial infarction involving other coronary artery of anterior wall: Secondary | ICD-10-CM | POA: Diagnosis not present

## 2017-01-15 DIAGNOSIS — I10 Essential (primary) hypertension: Secondary | ICD-10-CM | POA: Insufficient documentation

## 2017-01-15 DIAGNOSIS — E785 Hyperlipidemia, unspecified: Secondary | ICD-10-CM | POA: Insufficient documentation

## 2017-01-15 DIAGNOSIS — I5021 Acute systolic (congestive) heart failure: Secondary | ICD-10-CM | POA: Diagnosis not present

## 2017-01-15 DIAGNOSIS — E119 Type 2 diabetes mellitus without complications: Secondary | ICD-10-CM | POA: Diagnosis not present

## 2017-01-15 DIAGNOSIS — I472 Ventricular tachycardia: Secondary | ICD-10-CM | POA: Diagnosis not present

## 2017-01-15 DIAGNOSIS — I110 Hypertensive heart disease with heart failure: Secondary | ICD-10-CM | POA: Diagnosis not present

## 2017-01-15 DIAGNOSIS — I2109 ST elevation (STEMI) myocardial infarction involving other coronary artery of anterior wall: Secondary | ICD-10-CM | POA: Diagnosis not present

## 2017-01-15 DIAGNOSIS — I2510 Atherosclerotic heart disease of native coronary artery without angina pectoris: Secondary | ICD-10-CM | POA: Diagnosis not present

## 2017-01-15 DIAGNOSIS — I219 Acute myocardial infarction, unspecified: Secondary | ICD-10-CM | POA: Insufficient documentation

## 2017-01-16 ENCOUNTER — Telehealth: Payer: Self-pay | Admitting: Cardiology

## 2017-01-16 ENCOUNTER — Telehealth (HOSPITAL_COMMUNITY): Payer: Self-pay

## 2017-01-16 DIAGNOSIS — I472 Ventricular tachycardia: Secondary | ICD-10-CM | POA: Diagnosis not present

## 2017-01-16 DIAGNOSIS — I2109 ST elevation (STEMI) myocardial infarction involving other coronary artery of anterior wall: Secondary | ICD-10-CM | POA: Diagnosis not present

## 2017-01-16 DIAGNOSIS — I2510 Atherosclerotic heart disease of native coronary artery without angina pectoris: Secondary | ICD-10-CM | POA: Diagnosis not present

## 2017-01-16 DIAGNOSIS — I110 Hypertensive heart disease with heart failure: Secondary | ICD-10-CM | POA: Diagnosis not present

## 2017-01-16 DIAGNOSIS — I5021 Acute systolic (congestive) heart failure: Secondary | ICD-10-CM | POA: Diagnosis not present

## 2017-01-16 DIAGNOSIS — E119 Type 2 diabetes mellitus without complications: Secondary | ICD-10-CM | POA: Diagnosis not present

## 2017-01-16 NOTE — Telephone Encounter (Signed)
Spoke to [name] - [facility]  -  verbal order to continue with visit for teaching  Disease process and medications

## 2017-01-16 NOTE — Telephone Encounter (Signed)
She needs a verbal order for nurse to continue to teach  Pt about disease process and medicationa.They will do this until February 3rd.

## 2017-01-16 NOTE — Telephone Encounter (Signed)
3rd attempt to call patient in regards to Cardiac Rehab - Lm on Vm. °

## 2017-01-17 ENCOUNTER — Telehealth: Payer: Self-pay | Admitting: Cardiology

## 2017-01-17 NOTE — Telephone Encounter (Signed)
Pt of Dr. Martinique  Spoke to [place] who saw pt in home today. He is receiving PT, OT and home nursing care following hospitalization.  discharged from [facility] 12/15 w amiodarone on 2 week high dose 400mg  BID, to cut to 200mg  BID after 2 weeks. He was also sent home on metoprolol 25mg  BID.  HHRN had called on 12/18 to report asymptomatic bradycardia in 40s-50s. At that time, Metoprolol was cut to 12.5mg  BID at Dr. Doug Sou recommendation.  HR 50-54 at rest x1 week.   Asymptomatic otherwise, no concerns noted during home health RN's assessment today.   BP ~130s/70s this week - up from 110s/70s 10 days ago.  OT and RN will see pt early next week and will reassess.  Recommendation: Advised patient remain on metoprolol 12.5mg  BID, and plan to cut amiodarone dose this weekend as instructed at hospital discharge. Keep f/u appt 01/31/17. Call back or have patient reach out to [place] if concerns, new problems, or questions.  HHRN voiced understanding & she will relay instructions to patient. Eustaquio Maize will f/u w me later today or on Monday.   Dr. Martinique currently out of office, routed to Dr. Oval Linsey ([facility]) for further advice.

## 2017-01-17 NOTE — Telephone Encounter (Signed)
Patient c/o Palpitations:  High priority if patient c/o lightheadedness, shortness of breath, or chest pain  1) How long have you had palpitations/irregular HR/ Afib? Are you having the symptoms now?  no  2) Are you currently experiencing lightheadedness, SOB or CP?  no  Do you have a history of afib (atrial fibrillation) or irregular heart rhythm? no Have you checked your BP or HR? (document readings if available):  50-53hr  3) Are you experiencing any other symptoms? No, just the low HR

## 2017-01-20 DIAGNOSIS — I472 Ventricular tachycardia: Secondary | ICD-10-CM | POA: Diagnosis not present

## 2017-01-20 DIAGNOSIS — I5021 Acute systolic (congestive) heart failure: Secondary | ICD-10-CM | POA: Diagnosis not present

## 2017-01-20 DIAGNOSIS — I2109 ST elevation (STEMI) myocardial infarction involving other coronary artery of anterior wall: Secondary | ICD-10-CM | POA: Diagnosis not present

## 2017-01-20 DIAGNOSIS — I2510 Atherosclerotic heart disease of native coronary artery without angina pectoris: Secondary | ICD-10-CM | POA: Diagnosis not present

## 2017-01-20 DIAGNOSIS — I110 Hypertensive heart disease with heart failure: Secondary | ICD-10-CM | POA: Diagnosis not present

## 2017-01-20 DIAGNOSIS — E119 Type 2 diabetes mellitus without complications: Secondary | ICD-10-CM | POA: Diagnosis not present

## 2017-01-20 NOTE — Telephone Encounter (Signed)
OK thank you.  This seems fine if he is asymptomatic.

## 2017-01-22 DIAGNOSIS — E119 Type 2 diabetes mellitus without complications: Secondary | ICD-10-CM | POA: Diagnosis not present

## 2017-01-22 DIAGNOSIS — I2510 Atherosclerotic heart disease of native coronary artery without angina pectoris: Secondary | ICD-10-CM | POA: Diagnosis not present

## 2017-01-22 DIAGNOSIS — I472 Ventricular tachycardia: Secondary | ICD-10-CM | POA: Diagnosis not present

## 2017-01-22 DIAGNOSIS — I5021 Acute systolic (congestive) heart failure: Secondary | ICD-10-CM | POA: Diagnosis not present

## 2017-01-22 DIAGNOSIS — I2109 ST elevation (STEMI) myocardial infarction involving other coronary artery of anterior wall: Secondary | ICD-10-CM | POA: Diagnosis not present

## 2017-01-22 DIAGNOSIS — I110 Hypertensive heart disease with heart failure: Secondary | ICD-10-CM | POA: Diagnosis not present

## 2017-01-23 ENCOUNTER — Telehealth (HOSPITAL_COMMUNITY): Payer: Self-pay

## 2017-01-23 DIAGNOSIS — I2510 Atherosclerotic heart disease of native coronary artery without angina pectoris: Secondary | ICD-10-CM | POA: Diagnosis not present

## 2017-01-23 DIAGNOSIS — I110 Hypertensive heart disease with heart failure: Secondary | ICD-10-CM | POA: Diagnosis not present

## 2017-01-23 DIAGNOSIS — I2109 ST elevation (STEMI) myocardial infarction involving other coronary artery of anterior wall: Secondary | ICD-10-CM | POA: Diagnosis not present

## 2017-01-23 DIAGNOSIS — I5021 Acute systolic (congestive) heart failure: Secondary | ICD-10-CM | POA: Diagnosis not present

## 2017-01-23 DIAGNOSIS — I472 Ventricular tachycardia: Secondary | ICD-10-CM | POA: Diagnosis not present

## 2017-01-23 DIAGNOSIS — E119 Type 2 diabetes mellitus without complications: Secondary | ICD-10-CM | POA: Diagnosis not present

## 2017-01-23 NOTE — Telephone Encounter (Signed)
No response from patient in regards to Cardiac Rehab - Closed referral. °

## 2017-01-24 ENCOUNTER — Telehealth: Payer: Self-pay | Admitting: Cardiology

## 2017-01-24 ENCOUNTER — Telehealth: Payer: Self-pay

## 2017-01-24 DIAGNOSIS — I472 Ventricular tachycardia: Secondary | ICD-10-CM | POA: Diagnosis not present

## 2017-01-24 DIAGNOSIS — E119 Type 2 diabetes mellitus without complications: Secondary | ICD-10-CM | POA: Diagnosis not present

## 2017-01-24 DIAGNOSIS — I2510 Atherosclerotic heart disease of native coronary artery without angina pectoris: Secondary | ICD-10-CM | POA: Diagnosis not present

## 2017-01-24 DIAGNOSIS — I5021 Acute systolic (congestive) heart failure: Secondary | ICD-10-CM | POA: Diagnosis not present

## 2017-01-24 DIAGNOSIS — I110 Hypertensive heart disease with heart failure: Secondary | ICD-10-CM | POA: Diagnosis not present

## 2017-01-24 DIAGNOSIS — I2109 ST elevation (STEMI) myocardial infarction involving other coronary artery of anterior wall: Secondary | ICD-10-CM | POA: Diagnosis not present

## 2017-01-24 NOTE — Telephone Encounter (Signed)
New message    Luis Donaldson from [employer] calling with BP concerns.Please call [phone]  Pt c/o BP issue: STAT if pt c/o blurred vision, one-sided weakness or slurred speech  1. What are your last 5 BP readings? 180/90  2. Are you having any other symptoms (ex. Dizziness, headache, blurred vision, passed out)? NO  3. What is your BP issue? BP higher than usual

## 2017-01-24 NOTE — Telephone Encounter (Signed)
Spoke to patient and Eustaquio Maize from [facility] Dr.Jordan's advice given.Advised to bring B/P readings to appointment with Dr.Jordan 01/30/17 at 10:00 am.

## 2017-01-24 NOTE — Telephone Encounter (Signed)
Received a call from [facility] with [facility].Stated patient's B/P after medications 162/80 pulse 46.Stated B/P ranging 138/68,146/60,116/64,140/78,136/74.Pulse 50's.She wanted to know if medication needs to be adjusted.Message sent to [place].

## 2017-01-24 NOTE — Telephone Encounter (Signed)
For the most part BP looks good. Continue to observe. Beta blocker recently reduced.  La Dibella Martinique MD, [facility]

## 2017-01-24 NOTE — Telephone Encounter (Signed)
Returned call to [place] with [facility].She checked patient's B/P this morning before medications 180/90 pulse 50.Patient was feeling good no complaints.Stated she will recheck B/P this afternoon after he takes medications.

## 2017-01-25 NOTE — Progress Notes (Signed)
01/30/2017 Luis Donaldson   [birth year]  [id number]  Primary Physician Benito Mccreedy, MD Primary Cardiologist: Dr Martinique  HPI:  72 y.o. male is seen for follow up CAD. He has a PMH of remote PCI in the 90's at [facility] then CABG in 2000 at [facility]. He was not seeing any provider and had stooped all his medication for at least a year. His explanation was a combination of doing well, cost, and side effects.   He presented to [facility] 12/16/16 with chest pain, respiratory failure, hypertensive emergency, persistent runs of NSVT, and what was thought to be STEMI with ST elevation in V1-V2.  He was intubated and an IO line was placed. He underwent emergent heart cath which did not reveal culprit lesion; it showed 2 patent grafts and chronic RCA graft occlusion with collaterals.  His EF was 25-30% with grade 1 DD. Medical rx was resume. He was discharged 12/19/16.  He was readmitted Dec 12 with bradycardia, frequent urination. Found to have repetitive runs of NSVT and was started on amiodarone. UA was negative.   On follow up today he is doing well. He is getting home PT and OT. Walking up to 6 minutes in his house. Still noted to be bradycardic. He denies any chest pain, dyspnea, palpitations, dizziness or fatigue.    Current Outpatient Medications  Medication Sig Dispense Refill  . amiodarone (PACERONE) 200 MG tablet Take 1 tablet (200 mg total) by mouth daily. 30 tablet 6  . aspirin EC 81 MG EC tablet Take 1 tablet (81 mg total) by mouth daily. 30 tablet 11  . atorvastatin (LIPITOR) 80 MG tablet Take 1 tablet (80 mg total) by mouth daily at 6 PM. 30 tablet 11  . furosemide (LASIX) 40 MG tablet Take 1 tablet (40 mg total) by mouth daily. 30 tablet 6  . metoprolol tartrate (LOPRESSOR) 25 MG tablet Take 1/2 tablet ( 12.5 mg ) twice a day 180 tablet 3  . nitroGLYCERIN (NITROSTAT) 0.4 MG SL tablet Place 1 tablet (0.4 mg total) under the tongue every 5 (five) minutes x 3 doses as needed for chest pain. 25  tablet 12  . potassium chloride 20 MEQ TBCR Take 20 mEq by mouth daily. 30 tablet 6  . ranitidine (ZANTAC) 150 MG tablet Take 150 mg by mouth 2 (two) times daily as needed for heartburn. Uses [employer] brand    . losartan (COZAAR) 100 MG tablet Take 1 tablet (100 mg total) by mouth daily. 90 tablet 3   No current facility-administered medications for this visit.     No Known Allergies  Past Medical History:  Diagnosis Date  . Coronary artery disease   . Hyperlipidemia   . Hypertension   . MI (myocardial infarction) [facility]     Social History   Socioeconomic History  . Marital status: Divorced    Spouse name: [name]  . Number of children: [name]  . Years of education: [name]  . Highest education level: [name]  Social Needs  . Financial resource strain: [name]  . Food insecurity - worry: [name]  . Food insecurity - inability: [name]  . Transportation needs - medical: [name]  . Transportation needs - non-medical: [name]  Occupational History  . [name]  Tobacco Use  . Smoking status: Former Smoker    Types: Cigarettes    Last attempt to quit: 12/11/2016  Years since quitting: 0.1  . Smokeless tobacco: Never Used  Substance and Sexual Activity  . Alcohol use: No    Frequency: Never  . Drug use: No  . Sexual activity: [name]  Other Topics Concern  . [name]  Social History Narrative  . [name]     Family History  Problem Relation Age of Onset  . Heart disease Mother      Review of Systems: As noted in HPI All other systems reviewed and are otherwise negative except as noted above.    Blood pressure 132/66, pulse (!) 46, height 5\' 9"  (1.753 m), weight 201 lb (91.2 kg).  GENERAL:  Well appearing WM in NAD HEENT:  PERRL, EOMI, sclera are clear. Oropharynx is clear. NECK:  No jugular venous distention, carotid upstroke brisk and symmetric, no bruits, no thyromegaly or adenopathy LUNGS:  Clear to  auscultation bilaterally CHEST:  Unremarkable HEART:  RRR,  PMI not displaced or sustained,S1 and S2 within normal limits, no S3, no S4: no clicks, no rubs, no murmurs ABD:  Soft, nontender. BS +, no masses or bruits. No hepatomegaly, no splenomegaly EXT:  2 + pulses throughout, no edema, no cyanosis no clubbing SKIN:  Warm and dry.  No rashes NEURO:  Alert and oriented x 3. Cranial nerves II through XII intact. PSYCH:  Cognitively intact    Laboratory data: Lab Results  Component Value Date   WBC 10.4 01/01/2017   HGB 14.5 01/01/2017   HCT 42.3 01/01/2017   PLT 164 01/01/2017   GLUCOSE 180 (H) 01/02/2017   CHOL 202 (H) 12/17/2016   TRIG 231 (H) 12/17/2016   HDL 31 (L) 12/17/2016   LDLCALC 125 (H) 12/17/2016   ALT 30 01/01/2017   AST 22 01/01/2017   NA 137 01/02/2017   K 4.0 01/02/2017   CL 107 01/02/2017   CREATININE 1.14 01/02/2017   BUN 19 01/02/2017   CO2 21 (L) 01/02/2017   TSH 3.479 01/01/2017   INR 1.02 12/16/2016   HGBA1C 7.0 (H) 12/17/2016     Echo 12/17/16: Study Conclusions  - Left ventricle: The cavity size was normal. Systolic function was   severely reduced. The estimated ejection fraction was in the   range of 25% to 30%. Anterior wall, apical and mid to distal   inferoapical thinning and akinesis suggestive of extensive LAD   territory infarct, basal to mid inferior wall and lateral wall   hypokinesis. Doppler parameters are consistent with abnormal left   ventricular relaxation (grade 1 diastolic dysfunction). LV   filling pressure appears elevated (b-bump noted on mitral   m-mode). - Aortic valve: Sclerosis without stenosis. There was trivial   regurgitation. - Mitral valve: Mildly thickened leaflets . Systolic bowing without   prolapse. There was mild regurgitation. - Left atrium: The atrium was normal in size. - Right ventricle: The cavity size was normal. Mild hypokinesis. - Systemic veins: The IVC measures <2.1 cm, but does not collapse    >50%, suggesting an elevated RA pressure of 8 mmHg.  Impressions:  - LVEF 25-30%, moderate LVH with anterior wall thinning and   enhanced echogenicity suggestive of infarct, there is anterior,   apical and mid to distal inferoapical akinesis suggestive of   extensive LAD territroy infarct, inferior and lateral wall   hypokinesis also noted. grade 1 DD, biventricular filling   pressures are elevated.  Cardiac cath 12/17/16: Procedures   LEFT HEART CATH AND CORONARY ANGIOGRAPHY  Conclusion  There is severe left ventricular systolic dysfunction.  LV end diastolic pressure is severely elevated.  The left ventricular ejection fraction is 25-35% by visual estimate.  Mid LM lesion is 65% stenosed.  Ost LAD to Prox LAD lesion is 100% stenosed.  Ost 1st Mrg lesion is 100% stenosed.  Prox RCA to Mid RCA lesion is 80% stenosed.  Mid RCA lesion is 100% stenosed.  Left radial artery graft was visualized by angiography and is large.  The graft exhibits no disease.  1st Mrg lesion is 60% stenosed.  LIMA graft was visualized by angiography and is large.  The graft exhibits no disease.  SVG.  Origin lesion is 100% stenosed.   1.  Severe 3 vessel occlusive CAD 2.  Patent LIMA to the LAD 3.  Patent free radial graft to the OM1 4.  Occluded SVG to the RCA. This appears chronic.  5.  The RCA and first diagonal are filled by collaterals 6.  Severe LV dysfunction. EF estimated at 25-30% 7.  Markedly elevated LVEDP.  Plan: I am unable to identify any culprit lesion. The LAD and LCx territories are well revascularized. The SVG to the RCA is occluded but this appears chronic and the native RCA also has a chronic occlusion and is well collateralized. The first diagonal is small and also has collaterals. I doubt this was grafted and no other grafts identified. I think his presentation was due to decompensated CHF with arrhythmia and Hypertensive urgency. Will admit to ICU. Consult  CCM for management of vent. Continue IV Ntg and diurese with IV lasix. Will resume IV heparin until cardiac enzymes are cycled. Check Echo.      ASSESSMENT AND PLAN:  1. CAD s/p CABG 2000. Cardiac cath in November showed patent LIMA to the LAD and radial graft to the OM. Occluded SVG to RCA- chronic and native RCA also occluded with collaterals. Plan continued medical therapy. He is asymptomatic.  2. Chronic systolic CHF. EF 25-30%. May be related to uncontrolled HTN. BP now well controlled. Unable to increase beta blocker due to bradycardia. Will increase losartan to 100 mg daily. Plan to repeat Echo at 3 months. If EF still low will consider switch to [facility].  3. NSVT. Asymptomatic on amiodarone. Given bradycardia will reduce amiodarone to 200 mg daily. If EF still low on follow up Echo will need to consider for ICD  4. HTN controlled.   5. Hypercholesterolemia. On statin. Repeat fasting lab in 6 weeks.     Venia Riveron Martinique MD,FACC 01/30/2017 10:34 AM

## 2017-01-28 ENCOUNTER — Telehealth: Payer: Self-pay | Admitting: Cardiology

## 2017-01-28 DIAGNOSIS — I2109 ST elevation (STEMI) myocardial infarction involving other coronary artery of anterior wall: Secondary | ICD-10-CM | POA: Diagnosis not present

## 2017-01-28 DIAGNOSIS — I472 Ventricular tachycardia: Secondary | ICD-10-CM | POA: Diagnosis not present

## 2017-01-28 DIAGNOSIS — I5021 Acute systolic (congestive) heart failure: Secondary | ICD-10-CM | POA: Diagnosis not present

## 2017-01-28 DIAGNOSIS — I2510 Atherosclerotic heart disease of native coronary artery without angina pectoris: Secondary | ICD-10-CM | POA: Diagnosis not present

## 2017-01-28 DIAGNOSIS — I110 Hypertensive heart disease with heart failure: Secondary | ICD-10-CM | POA: Diagnosis not present

## 2017-01-28 DIAGNOSIS — E119 Type 2 diabetes mellitus without complications: Secondary | ICD-10-CM | POA: Diagnosis not present

## 2017-01-28 NOTE — Telephone Encounter (Signed)
Returned call to [name] at [facility] patient has appt 1/10 with Dr. Martinique.  Will route to him to review.

## 2017-01-28 NOTE — Telephone Encounter (Signed)
New message    Call from [name] V. From [facility] [phone] Calling to get permission to enroll patient into tele-monitoring program (scales and BP cuff and monitor given to patient). Please call

## 2017-01-28 NOTE — Telephone Encounter (Signed)
Ok to order telemonitoring  Luis Donaldson Martinique MD, [facility]

## 2017-01-29 DIAGNOSIS — I2510 Atherosclerotic heart disease of native coronary artery without angina pectoris: Secondary | ICD-10-CM | POA: Diagnosis not present

## 2017-01-29 DIAGNOSIS — I472 Ventricular tachycardia: Secondary | ICD-10-CM | POA: Diagnosis not present

## 2017-01-29 DIAGNOSIS — I110 Hypertensive heart disease with heart failure: Secondary | ICD-10-CM | POA: Diagnosis not present

## 2017-01-29 DIAGNOSIS — E119 Type 2 diabetes mellitus without complications: Secondary | ICD-10-CM | POA: Diagnosis not present

## 2017-01-29 DIAGNOSIS — I2109 ST elevation (STEMI) myocardial infarction involving other coronary artery of anterior wall: Secondary | ICD-10-CM | POA: Diagnosis not present

## 2017-01-29 DIAGNOSIS — I5021 Acute systolic (congestive) heart failure: Secondary | ICD-10-CM | POA: Diagnosis not present

## 2017-01-30 ENCOUNTER — Ambulatory Visit (INDEPENDENT_AMBULATORY_CARE_PROVIDER_SITE_OTHER): Payer: Medicare Other | Admitting: Cardiology

## 2017-01-30 ENCOUNTER — Encounter: Payer: Self-pay | Admitting: Cardiology

## 2017-01-30 VITALS — BP 132/66 | HR 46 | Ht 69.0 in | Wt 201.0 lb

## 2017-01-30 DIAGNOSIS — I25110 Atherosclerotic heart disease of native coronary artery with unstable angina pectoris: Secondary | ICD-10-CM

## 2017-01-30 DIAGNOSIS — I200 Unstable angina: Secondary | ICD-10-CM | POA: Diagnosis not present

## 2017-01-30 DIAGNOSIS — I5021 Acute systolic (congestive) heart failure: Secondary | ICD-10-CM | POA: Diagnosis not present

## 2017-01-30 MED ORDER — LOSARTAN POTASSIUM 100 MG PO TABS: 100.0000 mg | ORAL_TABLET | Freq: Every day | ORAL | 3 refills | Status: DC

## 2017-01-30 MED ORDER — AMIODARONE HCL 200 MG PO TABS: 200.0000 mg | ORAL_TABLET | Freq: Every day | ORAL | 6 refills | Status: DC

## 2017-01-30 MED FILL — LOSARTAN POTASSIUM 100 MG T: 100 | 30 days supply | Qty: 30 | Fill #0 | Status: TO

## 2017-01-30 MED FILL — AMIODARONE HCL 200 MG TAB: 200 | 30 days supply | Qty: 30 | Fill #0 | Status: TO

## 2017-01-30 NOTE — Telephone Encounter (Signed)
She is calling back for the order for his telemonitoring.

## 2017-01-30 NOTE — Telephone Encounter (Signed)
Returned cal to [name] with [place] advised ok to order telemonitor

## 2017-01-30 NOTE — Patient Instructions (Signed)
Reduce amiodarone to 1 tablet daily- 200 mg  Increase losartan to 100 mg daily  Continue your other therapy  I will see you in 6 weeks with lab work.

## 2017-01-31 ENCOUNTER — Telehealth: Payer: Self-pay | Admitting: Cardiology

## 2017-01-31 NOTE — Telephone Encounter (Signed)
Returned call to [place] with [facility] calling to get a order to extend PT to once a week for 3 more weeks.Advised I will send message to [place].

## 2017-01-31 NOTE — Telephone Encounter (Signed)
New message      Needs order to continue PT once a week for 3 weeks

## 2017-01-31 NOTE — Telephone Encounter (Signed)
OK to extend PT>  Hallis Meditz Martinique MD, [facility]

## 2017-02-03 ENCOUNTER — Telehealth: Payer: Self-pay | Admitting: Cardiology

## 2017-02-03 DIAGNOSIS — E119 Type 2 diabetes mellitus without complications: Secondary | ICD-10-CM | POA: Diagnosis not present

## 2017-02-03 DIAGNOSIS — I110 Hypertensive heart disease with heart failure: Secondary | ICD-10-CM | POA: Diagnosis not present

## 2017-02-03 DIAGNOSIS — I2109 ST elevation (STEMI) myocardial infarction involving other coronary artery of anterior wall: Secondary | ICD-10-CM | POA: Diagnosis not present

## 2017-02-03 DIAGNOSIS — I5021 Acute systolic (congestive) heart failure: Secondary | ICD-10-CM | POA: Diagnosis not present

## 2017-02-03 DIAGNOSIS — I2510 Atherosclerotic heart disease of native coronary artery without angina pectoris: Secondary | ICD-10-CM | POA: Diagnosis not present

## 2017-02-03 DIAGNOSIS — I472 Ventricular tachycardia: Secondary | ICD-10-CM | POA: Diagnosis not present

## 2017-02-03 NOTE — Telephone Encounter (Signed)
Returned call to [place] with [facility].Dr.Jordan's recommendations given.

## 2017-02-03 NOTE — Telephone Encounter (Signed)
Please call,concerning his medicine. °

## 2017-02-03 NOTE — Telephone Encounter (Signed)
Returned the call to the patient. He stated that he needed clarification on his amiodarone and losartan. It has been explained that he should take 200 mg daily of amiodarone and 100 mg daily of losartan. The patient verbalized his understanding.

## 2017-02-05 DIAGNOSIS — I2510 Atherosclerotic heart disease of native coronary artery without angina pectoris: Secondary | ICD-10-CM | POA: Diagnosis not present

## 2017-02-05 DIAGNOSIS — I110 Hypertensive heart disease with heart failure: Secondary | ICD-10-CM | POA: Diagnosis not present

## 2017-02-05 DIAGNOSIS — I472 Ventricular tachycardia: Secondary | ICD-10-CM | POA: Diagnosis not present

## 2017-02-05 DIAGNOSIS — I5021 Acute systolic (congestive) heart failure: Secondary | ICD-10-CM | POA: Diagnosis not present

## 2017-02-05 DIAGNOSIS — E119 Type 2 diabetes mellitus without complications: Secondary | ICD-10-CM | POA: Diagnosis not present

## 2017-02-05 DIAGNOSIS — I2109 ST elevation (STEMI) myocardial infarction involving other coronary artery of anterior wall: Secondary | ICD-10-CM | POA: Diagnosis not present

## 2017-02-06 DIAGNOSIS — I2109 ST elevation (STEMI) myocardial infarction involving other coronary artery of anterior wall: Secondary | ICD-10-CM | POA: Diagnosis not present

## 2017-02-06 DIAGNOSIS — I472 Ventricular tachycardia: Secondary | ICD-10-CM | POA: Diagnosis not present

## 2017-02-06 DIAGNOSIS — E119 Type 2 diabetes mellitus without complications: Secondary | ICD-10-CM | POA: Diagnosis not present

## 2017-02-06 DIAGNOSIS — I110 Hypertensive heart disease with heart failure: Secondary | ICD-10-CM | POA: Diagnosis not present

## 2017-02-06 DIAGNOSIS — I5021 Acute systolic (congestive) heart failure: Secondary | ICD-10-CM | POA: Diagnosis not present

## 2017-02-06 DIAGNOSIS — I2510 Atherosclerotic heart disease of native coronary artery without angina pectoris: Secondary | ICD-10-CM | POA: Diagnosis not present

## 2017-02-10 ENCOUNTER — Telehealth: Payer: Self-pay | Admitting: Cardiology

## 2017-02-10 DIAGNOSIS — E119 Type 2 diabetes mellitus without complications: Secondary | ICD-10-CM | POA: Diagnosis not present

## 2017-02-10 DIAGNOSIS — I110 Hypertensive heart disease with heart failure: Secondary | ICD-10-CM | POA: Diagnosis not present

## 2017-02-10 DIAGNOSIS — I472 Ventricular tachycardia: Secondary | ICD-10-CM | POA: Diagnosis not present

## 2017-02-10 DIAGNOSIS — I5021 Acute systolic (congestive) heart failure: Secondary | ICD-10-CM | POA: Diagnosis not present

## 2017-02-10 DIAGNOSIS — I2510 Atherosclerotic heart disease of native coronary artery without angina pectoris: Secondary | ICD-10-CM | POA: Diagnosis not present

## 2017-02-10 DIAGNOSIS — I2109 ST elevation (STEMI) myocardial infarction involving other coronary artery of anterior wall: Secondary | ICD-10-CM | POA: Diagnosis not present

## 2017-02-10 MED ORDER — AMLODIPINE BESYLATE 2.5 MG PO TABS: 2.5000 mg | ORAL_TABLET | Freq: Every day | ORAL | 6 refills | Status: DC

## 2017-02-10 NOTE — Telephone Encounter (Signed)
I would recommend adding amlodipine 2.5 mg daily for BP control.  Peter Martinique MD, [facility]

## 2017-02-10 NOTE — Telephone Encounter (Signed)
Returned call to [place] PT with [facility] She reports patient has been having elevated BP He has left shoulder & rib cage pain, but thinks is MSK BP last night was 175/65, came down to 140/64, 142/66.  BP was 120s/60s right before bed BP today was elevated at 179/68 His HR is never over 50bpm  Advised would route to Dr. Martinique to review and advise on BP and concerns

## 2017-02-10 NOTE — Telephone Encounter (Signed)
New message     Patient calling back states medication sent to wrong pharmacy.   *STAT* If patient is at the pharmacy, call can be transferred to refill team.   1. Which medications need to be refilled? (please list name of each medication and dose if known) amLODipine (NORVASC) 2.5 MG tablet  2. Which pharmacy/location (including street and city if local pharmacy) is medication to be sent to? [place] , phone [phone]  3. Do they need a 30 day or 90 day supply? [place]

## 2017-02-10 NOTE — Telephone Encounter (Signed)
[facility]rap[facility] ( [place]) is calling because Mr. Luis Donaldson blood elevated on last night to 175/65 then it went down to 140/64, and 142/66. But it is elevated again to 179/68 pulse is 48 and has not been above 50 since they have been working with him Also complaining left side pain . Please call   Thanks

## 2017-02-10 NOTE — Telephone Encounter (Signed)
Returned call to [place] PT Dr.Jordan's recommendations given.Amlodipine 2.5 mg sent to [facility].Advised to call back if B/P continues to be elevated.

## 2017-02-11 DIAGNOSIS — E119 Type 2 diabetes mellitus without complications: Secondary | ICD-10-CM | POA: Diagnosis not present

## 2017-02-11 DIAGNOSIS — I2510 Atherosclerotic heart disease of native coronary artery without angina pectoris: Secondary | ICD-10-CM | POA: Diagnosis not present

## 2017-02-11 DIAGNOSIS — I5021 Acute systolic (congestive) heart failure: Secondary | ICD-10-CM | POA: Diagnosis not present

## 2017-02-11 DIAGNOSIS — I2109 ST elevation (STEMI) myocardial infarction involving other coronary artery of anterior wall: Secondary | ICD-10-CM | POA: Diagnosis not present

## 2017-02-11 DIAGNOSIS — I110 Hypertensive heart disease with heart failure: Secondary | ICD-10-CM | POA: Diagnosis not present

## 2017-02-11 DIAGNOSIS — I472 Ventricular tachycardia: Secondary | ICD-10-CM | POA: Diagnosis not present

## 2017-02-11 MED ORDER — AMLODIPINE BESYLATE 2.5 MG PO TABS: 2.5000 mg | ORAL_TABLET | Freq: Every day | ORAL | 6 refills | Status: DC

## 2017-02-11 NOTE — Telephone Encounter (Signed)
Rx(s) sent to pharmacy electronically.  

## 2017-02-12 DIAGNOSIS — E119 Type 2 diabetes mellitus without complications: Secondary | ICD-10-CM | POA: Diagnosis not present

## 2017-02-12 DIAGNOSIS — I5021 Acute systolic (congestive) heart failure: Secondary | ICD-10-CM | POA: Diagnosis not present

## 2017-02-12 DIAGNOSIS — I2510 Atherosclerotic heart disease of native coronary artery without angina pectoris: Secondary | ICD-10-CM | POA: Diagnosis not present

## 2017-02-12 DIAGNOSIS — I472 Ventricular tachycardia: Secondary | ICD-10-CM | POA: Diagnosis not present

## 2017-02-12 DIAGNOSIS — I2109 ST elevation (STEMI) myocardial infarction involving other coronary artery of anterior wall: Secondary | ICD-10-CM | POA: Diagnosis not present

## 2017-02-12 DIAGNOSIS — I110 Hypertensive heart disease with heart failure: Secondary | ICD-10-CM | POA: Diagnosis not present

## 2017-02-17 ENCOUNTER — Telehealth: Payer: Self-pay | Admitting: Cardiology

## 2017-02-17 DIAGNOSIS — I472 Ventricular tachycardia: Secondary | ICD-10-CM | POA: Diagnosis not present

## 2017-02-17 DIAGNOSIS — I110 Hypertensive heart disease with heart failure: Secondary | ICD-10-CM | POA: Diagnosis not present

## 2017-02-17 DIAGNOSIS — I2109 ST elevation (STEMI) myocardial infarction involving other coronary artery of anterior wall: Secondary | ICD-10-CM | POA: Diagnosis not present

## 2017-02-17 DIAGNOSIS — I2510 Atherosclerotic heart disease of native coronary artery without angina pectoris: Secondary | ICD-10-CM | POA: Diagnosis not present

## 2017-02-17 DIAGNOSIS — E119 Type 2 diabetes mellitus without complications: Secondary | ICD-10-CM | POA: Diagnosis not present

## 2017-02-17 DIAGNOSIS — I5021 Acute systolic (congestive) heart failure: Secondary | ICD-10-CM | POA: Diagnosis not present

## 2017-02-17 NOTE — Telephone Encounter (Signed)
May continue telemonitoring and home visits for another 8 weeks.  Peter Martinique MD, [facility]

## 2017-02-17 NOTE — Telephone Encounter (Signed)
Routed to MD to advised on home health orders as noted

## 2017-02-17 NOTE — Telephone Encounter (Signed)
New Message   Luis Donaldson is calling from [place] with several questions. She wants to know if they can continue tele-monitoring for an additional 8 weeks as well as home visits. She also would like to know the parameters for the patients vital for the HR and BP. Please call to discuss.

## 2017-02-18 DIAGNOSIS — I2510 Atherosclerotic heart disease of native coronary artery without angina pectoris: Secondary | ICD-10-CM | POA: Diagnosis not present

## 2017-02-18 DIAGNOSIS — I5021 Acute systolic (congestive) heart failure: Secondary | ICD-10-CM | POA: Diagnosis not present

## 2017-02-18 DIAGNOSIS — I472 Ventricular tachycardia: Secondary | ICD-10-CM | POA: Diagnosis not present

## 2017-02-18 DIAGNOSIS — E119 Type 2 diabetes mellitus without complications: Secondary | ICD-10-CM | POA: Diagnosis not present

## 2017-02-18 DIAGNOSIS — I110 Hypertensive heart disease with heart failure: Secondary | ICD-10-CM | POA: Diagnosis not present

## 2017-02-18 DIAGNOSIS — I2109 ST elevation (STEMI) myocardial infarction involving other coronary artery of anterior wall: Secondary | ICD-10-CM | POA: Diagnosis not present

## 2017-02-18 NOTE — Telephone Encounter (Signed)
Follow  Up   Luis Donaldson from [facility] calling to request order for  Nursing and PT once a week for 8weeks. [facility]  Phone # [phone]

## 2017-02-18 NOTE — Telephone Encounter (Signed)
Returned call to [name] with [facility] she needs B/P and pulse parameters for telemonitor.Advised I will ask Dr.Jordan and call her back.

## 2017-02-19 DIAGNOSIS — I472 Ventricular tachycardia: Secondary | ICD-10-CM | POA: Diagnosis not present

## 2017-02-19 DIAGNOSIS — I5021 Acute systolic (congestive) heart failure: Secondary | ICD-10-CM | POA: Diagnosis not present

## 2017-02-19 DIAGNOSIS — I2109 ST elevation (STEMI) myocardial infarction involving other coronary artery of anterior wall: Secondary | ICD-10-CM | POA: Diagnosis not present

## 2017-02-19 DIAGNOSIS — E119 Type 2 diabetes mellitus without complications: Secondary | ICD-10-CM | POA: Diagnosis not present

## 2017-02-19 DIAGNOSIS — I110 Hypertensive heart disease with heart failure: Secondary | ICD-10-CM | POA: Diagnosis not present

## 2017-02-19 DIAGNOSIS — I2510 Atherosclerotic heart disease of native coronary artery without angina pectoris: Secondary | ICD-10-CM | POA: Diagnosis not present

## 2017-02-19 NOTE — Telephone Encounter (Signed)
Spoke to [name] with [facility].Dr.Jordan advised ok for nursing and pt once a week for 8 weeks.Advised B/P and pulse parameters, pulse less than 45.B/P less than 90 systolic and greater than 099 systolic.

## 2017-02-24 DIAGNOSIS — I110 Hypertensive heart disease with heart failure: Secondary | ICD-10-CM | POA: Diagnosis not present

## 2017-02-24 DIAGNOSIS — I472 Ventricular tachycardia: Secondary | ICD-10-CM | POA: Diagnosis not present

## 2017-02-24 DIAGNOSIS — Z720 Tobacco use: Secondary | ICD-10-CM | POA: Diagnosis not present

## 2017-02-24 DIAGNOSIS — E785 Hyperlipidemia, unspecified: Secondary | ICD-10-CM | POA: Diagnosis not present

## 2017-02-24 DIAGNOSIS — E119 Type 2 diabetes mellitus without complications: Secondary | ICD-10-CM | POA: Diagnosis not present

## 2017-02-24 DIAGNOSIS — I252 Old myocardial infarction: Secondary | ICD-10-CM | POA: Diagnosis not present

## 2017-02-24 DIAGNOSIS — I5021 Acute systolic (congestive) heart failure: Secondary | ICD-10-CM | POA: Diagnosis not present

## 2017-02-24 DIAGNOSIS — I2510 Atherosclerotic heart disease of native coronary artery without angina pectoris: Secondary | ICD-10-CM | POA: Diagnosis not present

## 2017-02-24 DIAGNOSIS — I255 Ischemic cardiomyopathy: Secondary | ICD-10-CM | POA: Diagnosis not present

## 2017-02-24 DIAGNOSIS — Z951 Presence of aortocoronary bypass graft: Secondary | ICD-10-CM | POA: Diagnosis not present

## 2017-02-24 DIAGNOSIS — Z7982 Long term (current) use of aspirin: Secondary | ICD-10-CM | POA: Diagnosis not present

## 2017-02-24 DIAGNOSIS — J9690 Respiratory failure, unspecified, unspecified whether with hypoxia or hypercapnia: Secondary | ICD-10-CM | POA: Diagnosis not present

## 2017-02-26 DIAGNOSIS — I255 Ischemic cardiomyopathy: Secondary | ICD-10-CM | POA: Diagnosis not present

## 2017-02-26 DIAGNOSIS — I5021 Acute systolic (congestive) heart failure: Secondary | ICD-10-CM | POA: Diagnosis not present

## 2017-02-26 DIAGNOSIS — E119 Type 2 diabetes mellitus without complications: Secondary | ICD-10-CM | POA: Diagnosis not present

## 2017-02-26 DIAGNOSIS — I110 Hypertensive heart disease with heart failure: Secondary | ICD-10-CM | POA: Diagnosis not present

## 2017-02-26 DIAGNOSIS — I252 Old myocardial infarction: Secondary | ICD-10-CM | POA: Diagnosis not present

## 2017-02-26 DIAGNOSIS — I2510 Atherosclerotic heart disease of native coronary artery without angina pectoris: Secondary | ICD-10-CM | POA: Diagnosis not present

## 2017-02-27 DIAGNOSIS — I110 Hypertensive heart disease with heart failure: Secondary | ICD-10-CM | POA: Diagnosis not present

## 2017-02-27 DIAGNOSIS — I255 Ischemic cardiomyopathy: Secondary | ICD-10-CM | POA: Diagnosis not present

## 2017-02-27 DIAGNOSIS — I2510 Atherosclerotic heart disease of native coronary artery without angina pectoris: Secondary | ICD-10-CM | POA: Diagnosis not present

## 2017-02-27 DIAGNOSIS — I5021 Acute systolic (congestive) heart failure: Secondary | ICD-10-CM | POA: Diagnosis not present

## 2017-02-27 DIAGNOSIS — E119 Type 2 diabetes mellitus without complications: Secondary | ICD-10-CM | POA: Diagnosis not present

## 2017-02-27 DIAGNOSIS — I252 Old myocardial infarction: Secondary | ICD-10-CM | POA: Diagnosis not present

## 2017-03-03 DIAGNOSIS — I252 Old myocardial infarction: Secondary | ICD-10-CM | POA: Diagnosis not present

## 2017-03-03 DIAGNOSIS — I255 Ischemic cardiomyopathy: Secondary | ICD-10-CM | POA: Diagnosis not present

## 2017-03-03 DIAGNOSIS — I110 Hypertensive heart disease with heart failure: Secondary | ICD-10-CM | POA: Diagnosis not present

## 2017-03-03 DIAGNOSIS — E119 Type 2 diabetes mellitus without complications: Secondary | ICD-10-CM | POA: Diagnosis not present

## 2017-03-03 DIAGNOSIS — I2510 Atherosclerotic heart disease of native coronary artery without angina pectoris: Secondary | ICD-10-CM | POA: Diagnosis not present

## 2017-03-03 DIAGNOSIS — I5021 Acute systolic (congestive) heart failure: Secondary | ICD-10-CM | POA: Diagnosis not present

## 2017-03-04 DIAGNOSIS — E119 Type 2 diabetes mellitus without complications: Secondary | ICD-10-CM | POA: Diagnosis not present

## 2017-03-04 DIAGNOSIS — I110 Hypertensive heart disease with heart failure: Secondary | ICD-10-CM | POA: Diagnosis not present

## 2017-03-04 DIAGNOSIS — I2510 Atherosclerotic heart disease of native coronary artery without angina pectoris: Secondary | ICD-10-CM | POA: Diagnosis not present

## 2017-03-04 DIAGNOSIS — I252 Old myocardial infarction: Secondary | ICD-10-CM | POA: Diagnosis not present

## 2017-03-04 DIAGNOSIS — I255 Ischemic cardiomyopathy: Secondary | ICD-10-CM | POA: Diagnosis not present

## 2017-03-04 DIAGNOSIS — I5021 Acute systolic (congestive) heart failure: Secondary | ICD-10-CM | POA: Diagnosis not present

## 2017-03-05 DIAGNOSIS — I252 Old myocardial infarction: Secondary | ICD-10-CM | POA: Diagnosis not present

## 2017-03-05 DIAGNOSIS — I25110 Atherosclerotic heart disease of native coronary artery with unstable angina pectoris: Secondary | ICD-10-CM | POA: Diagnosis not present

## 2017-03-05 DIAGNOSIS — I5021 Acute systolic (congestive) heart failure: Secondary | ICD-10-CM | POA: Diagnosis not present

## 2017-03-05 DIAGNOSIS — I2510 Atherosclerotic heart disease of native coronary artery without angina pectoris: Secondary | ICD-10-CM | POA: Diagnosis not present

## 2017-03-05 DIAGNOSIS — E119 Type 2 diabetes mellitus without complications: Secondary | ICD-10-CM | POA: Diagnosis not present

## 2017-03-05 DIAGNOSIS — I110 Hypertensive heart disease with heart failure: Secondary | ICD-10-CM | POA: Diagnosis not present

## 2017-03-05 DIAGNOSIS — I255 Ischemic cardiomyopathy: Secondary | ICD-10-CM | POA: Diagnosis not present

## 2017-03-06 ENCOUNTER — Other Ambulatory Visit: Payer: Self-pay

## 2017-03-06 DIAGNOSIS — I25110 Atherosclerotic heart disease of native coronary artery with unstable angina pectoris: Secondary | ICD-10-CM

## 2017-03-06 DIAGNOSIS — I5021 Acute systolic (congestive) heart failure: Secondary | ICD-10-CM

## 2017-03-06 LAB — BASIC METABOLIC PANEL
BUN / CREAT RATIO: 18 (ref 10–24)
BUN: 30 mg/dL — ABNORMAL HIGH (ref 8–27)
CHLORIDE: 99 mmol/L (ref 96–106)
CO2: 20 mmol/L (ref 20–29)
Calcium: 9.2 mg/dL (ref 8.6–10.2)
Creatinine, Ser: 1.71 mg/dL — ABNORMAL HIGH (ref 0.76–1.27)
GFR calc Af Amer: 46 mL/min/{1.73_m2} — ABNORMAL LOW (ref >59)
GFR calc non Af Amer: 39 mL/min/{1.73_m2} — ABNORMAL LOW (ref >59)
GLUCOSE: 227 mg/dL — AB (ref 65–99)
POTASSIUM: 5 mmol/L (ref 3.5–5.2)
SODIUM: 137 mmol/L (ref 134–144)

## 2017-03-06 LAB — HEPATIC FUNCTION PANEL
ALK PHOS: 219 IU/L — AB (ref 39–117)
ALT: 96 IU/L — ABNORMAL HIGH (ref 0–44)
AST: 62 IU/L — AB (ref 0–40)
Albumin: 4.3 g/dL (ref 3.5–4.8)
BILIRUBIN TOTAL: 0.8 mg/dL (ref 0.0–1.2)
BILIRUBIN, DIRECT: 0.25 mg/dL (ref 0.00–0.40)
Total Protein: 7.1 g/dL (ref 6.0–8.5)

## 2017-03-06 LAB — LIPID PANEL W/O CHOL/HDL RATIO
Cholesterol, Total: 133 mg/dL (ref 100–199)
HDL: 35 mg/dL — ABNORMAL LOW (ref >39)
LDL Calculated: 70 mg/dL (ref 0–99)
Triglycerides: 140 mg/dL (ref 0–149)
VLDL CHOLESTEROL CAL: 28 mg/dL (ref 5–40)

## 2017-03-06 LAB — TSH: TSH: 4.68 u[IU]/mL — ABNORMAL HIGH (ref 0.450–4.500)

## 2017-03-10 DIAGNOSIS — I255 Ischemic cardiomyopathy: Secondary | ICD-10-CM | POA: Diagnosis not present

## 2017-03-10 DIAGNOSIS — I2510 Atherosclerotic heart disease of native coronary artery without angina pectoris: Secondary | ICD-10-CM | POA: Diagnosis not present

## 2017-03-10 DIAGNOSIS — I110 Hypertensive heart disease with heart failure: Secondary | ICD-10-CM | POA: Diagnosis not present

## 2017-03-10 DIAGNOSIS — I252 Old myocardial infarction: Secondary | ICD-10-CM | POA: Diagnosis not present

## 2017-03-10 DIAGNOSIS — E119 Type 2 diabetes mellitus without complications: Secondary | ICD-10-CM | POA: Diagnosis not present

## 2017-03-10 DIAGNOSIS — I5021 Acute systolic (congestive) heart failure: Secondary | ICD-10-CM | POA: Diagnosis not present

## 2017-03-11 DIAGNOSIS — E119 Type 2 diabetes mellitus without complications: Secondary | ICD-10-CM | POA: Diagnosis not present

## 2017-03-11 DIAGNOSIS — I255 Ischemic cardiomyopathy: Secondary | ICD-10-CM | POA: Diagnosis not present

## 2017-03-11 DIAGNOSIS — I5021 Acute systolic (congestive) heart failure: Secondary | ICD-10-CM | POA: Diagnosis not present

## 2017-03-11 DIAGNOSIS — I110 Hypertensive heart disease with heart failure: Secondary | ICD-10-CM | POA: Diagnosis not present

## 2017-03-11 DIAGNOSIS — I2510 Atherosclerotic heart disease of native coronary artery without angina pectoris: Secondary | ICD-10-CM | POA: Diagnosis not present

## 2017-03-11 DIAGNOSIS — I252 Old myocardial infarction: Secondary | ICD-10-CM | POA: Diagnosis not present

## 2017-03-11 NOTE — Progress Notes (Signed)
03/14/2017 Luis Donaldson   1945-12-17  [id number]  Primary Physician Ladell Pier, MD Primary Cardiologist: Dr Martinique  HPI:  72 y.o. male is seen for follow up CAD and CHF. He has a PMH of remote PCI in the 90's at [facility] then CABG in 2000 at [facility]. He was not seeing any provider and had stooped all his medication for at least a year prior to admission this past fall. His explanation was a combination of doing well, cost, and side effects.   He presented to [facility] 12/16/16 with chest pain, respiratory failure, hypertensive emergency, persistent runs of NSVT, and what was thought to be STEMI with ST elevation in V1-V2.  He was intubated.  He underwent emergent heart cath which did not reveal culprit lesion; it showed 2 patent grafts and chronic RCA graft occlusion with collaterals.  His EF was 25-30% with grade 1 DD. Medical rx was resumed. He was discharged 12/19/16.  He was readmitted Dec 12 with bradycardia, frequent urination. Found to have repetitive runs of NSVT and was started on amiodarone. UA was negative. On his last visit amiodarone was reduced. Subsequent follow up with Home health indicate persistent bradycardia but he is asymptomatic. BP was elevated and amlodipine was added.   On follow up today he has multiple complaints. He complains of DOE and very poor endurance. Can hardly walk to mailbox. Feels dizzy and lightheaded at times and almost blacked out once. He is getting home PT/OT. Now established with Dr. Wynetta Emery at [facility]. He complains of vivid dreams and nightmares. Recent lab work showed elevation of transaminases, TSH, and creatinine. Lasix and amiodarone reduced. Patient reports Dr. Wynetta Emery recommended glimiperide for his DM. Overall he feels very limited. He reports compliance with medication and has all his meds with him today.    Current Outpatient Medications  Medication Sig Dispense Refill  . amiodarone (PACERONE) 200 MG tablet Take 1/2  tablet ( 100 mg ) daily 90 tablet 3  . aspirin EC 81 MG EC tablet Take 1 tablet (81 mg total) by mouth daily. 30 tablet 11  . atorvastatin (LIPITOR) 80 MG tablet Take 0.5 tablets (40 mg total) by mouth daily at 6 PM. 30 tablet 11  . Blood Glucose Monitoring Suppl (TRUE METRIX METER) w/Device KIT Use as directed 1 kit 0  . furosemide (LASIX) 40 MG tablet Take 1/2 tablet ( 20 mg ) daily 90 tablet 3  . glimepiride (AMARYL) 2 MG tablet Take 1 tablet (2 mg total) by mouth daily before breakfast. 30 tablet 3  . glucose blood (TRUE METRIX BLOOD GLUCOSE TEST) test strip Use as instructed 100 each 12  . losartan (COZAAR) 100 MG tablet Take 1 tablet (100 mg total) by mouth daily. 90 tablet 3  . metoprolol tartrate (LOPRESSOR) 25 MG tablet Take 1/2 tablet ( 12.5 mg ) twice a day 180 tablet 3  . nitroGLYCERIN (NITROSTAT) 0.4 MG SL tablet Place 1 tablet (0.4 mg total) under the tongue every 5 (five) minutes x 3 doses as needed for chest pain. 25 tablet 12  . potassium chloride 20 MEQ TBCR Take 20 mEq by mouth daily. 30 tablet 6  . ranitidine (ZANTAC) 150 MG tablet Take 150 mg by mouth 2 (two) times daily as needed for heartburn. Uses [employer] brand    . TRUEPLUS LANCETS 28G MISC Use as directed 100 each 2  . hydrALAZINE (APRESOLINE) 25 MG tablet Take 1 tablet (25 mg total) by mouth 3 (three)  times daily. 270 tablet 3   No current facility-administered medications for this visit.     No Known Allergies  Past Medical History:  Diagnosis Date  . Coronary artery disease   . Hyperlipidemia   . Hypertension   . MI (myocardial infarction) [facility])     Social History   Socioeconomic History  . Marital status: Divorced    Spouse name: [name]  . Number of children: [name]  . Years of education: [name]  . Highest education level: [name]  Social Needs  . Financial resource strain: [name]  . Food insecurity - worry: [name]  . Food insecurity - inability: [name]  .  Transportation needs - medical: [name]  . Transportation needs - non-medical: [name]  Occupational History  . [name]  Tobacco Use  . Smoking status: Former Smoker    Types: Cigarettes    Last attempt to quit: 12/11/2016    Years since quitting: 0.2  . Smokeless tobacco: Never Used  Substance and Sexual Activity  . Alcohol use: No    Frequency: Never  . Drug use: No  . Sexual activity: [name]  Other Topics Concern  . [name]  Social History Narrative  . [name]     Family History  Problem Relation Age of Onset  . Heart disease Mother      Review of Systems: As noted in HPI All other systems reviewed and are otherwise negative except as noted above.    Blood pressure (!) 140/54, pulse (!) 57, height _0  (1.753 m), weight 202 lb (91.6 kg).  GENERAL:  Well appearing WM in NAD HEENT:  PERRL, EOMI, sclera are clear. Oropharynx is clear. NECK:  No jugular venous distention, carotid upstroke brisk and symmetric, no bruits, no thyromegaly or adenopathy LUNGS:  Clear to auscultation bilaterally CHEST:  Unremarkable HEART:  RRR,  PMI not displaced or sustained,S1 and S2 within normal limits, no S3, no S4: no clicks, no rubs, no murmurs ABD:  Soft, nontender. BS +, no masses or bruits. No hepatomegaly, no splenomegaly EXT:  2 + pulses throughout, no edema, no cyanosis no clubbing SKIN:  Warm and dry.  No rashes NEURO:  Alert and oriented x 3. Cranial nerves II through XII intact. PSYCH:  Cognitively intact      Laboratory data: Lab Results  Component Value Date   WBC 10.4 01/01/2017   HGB 14.5 01/01/2017   HCT 42.3 01/01/2017   PLT 164 01/01/2017   GLUCOSE 186 (H) 03/13/2017   CHOL 133 03/05/2017   TRIG 140 03/05/2017   HDL 35 (L) 03/05/2017   LDLCALC 70 03/05/2017   ALT 103 (H) 03/13/2017   AST 58 (H) 03/13/2017   NA 138 03/13/2017   K 5.6 (H) 03/13/2017   CL 102 03/13/2017   CREATININE 1.28 (H) 03/13/2017   BUN 19 03/13/2017    CO2 20 03/13/2017   TSH 4.680 (H) 03/05/2017   INR 1.02 12/16/2016   HGBA1C 7.0 (H) 12/17/2016   On 03/05/17: creatinine 1.71. Glucose 227. AST 62, ALT 96. Alk phos 219. TSH 4.68.   Echo 12/17/16: Study Conclusions  - Left ventricle: The cavity size was normal. Systolic function was   severely reduced. The estimated ejection fraction was in the   range of 25% to 30%. Anterior wall, apical and mid to distal   inferoapical thinning and akinesis suggestive  of extensive LAD   territory infarct, basal to mid inferior wall and lateral wall   hypokinesis. Doppler parameters are consistent with abnormal left   ventricular relaxation (grade 1 diastolic dysfunction). LV   filling pressure appears elevated (b-bump noted on mitral   m-mode). - Aortic valve: Sclerosis without stenosis. There was trivial   regurgitation. - Mitral valve: Mildly thickened leaflets . Systolic bowing without   prolapse. There was mild regurgitation. - Left atrium: The atrium was normal in size. - Right ventricle: The cavity size was normal. Mild hypokinesis. - Systemic veins: The IVC measures <2.1 cm, but does not collapse   >50%, suggesting an elevated RA pressure of 8 mmHg.  Impressions:  - LVEF 25-30%, moderate LVH with anterior wall thinning and   enhanced echogenicity suggestive of infarct, there is anterior,   apical and mid to distal inferoapical akinesis suggestive of   extensive LAD territroy infarct, inferior and lateral wall   hypokinesis also noted. grade 1 DD, biventricular filling   pressures are elevated.  Cardiac cath 12/17/16: Procedures   LEFT HEART CATH AND CORONARY ANGIOGRAPHY  Conclusion     There is severe left ventricular systolic dysfunction.  LV end diastolic pressure is severely elevated.  The left ventricular ejection fraction is 25-35% by visual estimate.  Mid LM lesion is 65% stenosed.  Ost LAD to Prox LAD lesion is 100% stenosed.  Ost 1st Mrg lesion is 100%  stenosed.  Prox RCA to Mid RCA lesion is 80% stenosed.  Mid RCA lesion is 100% stenosed.  Left radial artery graft was visualized by angiography and is large.  The graft exhibits no disease.  1st Mrg lesion is 60% stenosed.  LIMA graft was visualized by angiography and is large.  The graft exhibits no disease.  SVG.  Origin lesion is 100% stenosed.   1.  Severe 3 vessel occlusive CAD 2.  Patent LIMA to the LAD 3.  Patent free radial graft to the OM1 4.  Occluded SVG to the RCA. This appears chronic.  5.  The RCA and first diagonal are filled by collaterals 6.  Severe LV dysfunction. EF estimated at 25-30% 7.  Markedly elevated LVEDP.  Plan: I am unable to identify any culprit lesion. The LAD and LCx territories are well revascularized. The SVG to the RCA is occluded but this appears chronic and the native RCA also has a chronic occlusion and is well collateralized. The first diagonal is small and also has collaterals. I doubt this was grafted and no other grafts identified. I think his presentation was due to decompensated CHF with arrhythmia and Hypertensive urgency. Will admit to ICU. Consult CCM for management of vent. Continue IV Ntg and diurese with IV lasix. Will resume IV heparin until cardiac enzymes are cycled. Check Echo.      ASSESSMENT AND PLAN:  1. CAD s/p CABG 2000. Cardiac cath in November showed patent LIMA to the LAD and radial graft to the OM. Occluded SVG to RCA- chronic and native RCA also occluded with collaterals. Plan continued medical therapy. He has no significant angina.  2. Chronic systolic CHF. EF 25-30%. May be related to uncontrolled HTN as well as ischemic component. BP still not well controlled.  Unable to increase beta blocker due to bradycardia. On  losartan to 100 mg daily. Will DC amlodipine and start hydralazine 25 mg tid.  Plan to repeat Echo now. Probably not a candidate for Delene Loll unless he can get patient assistance. If EF still low  will  refer to advanced heart failure clinic. Will also refer to EP for ICD.   3. NSVT. on amiodarone. Given bradycardia will reduce amiodarone to 184m daily. If EF still low on follow up Echo will need to consider for ICD. I am concerned that persistent dizziness could be related to persistent arrhythmia.   4. HTN as noted above.   5. Hypercholesterolemia. At goal on lipitor 80 mg daily. Given elevated transaminases will decrease dose to 40 mg.  6. Elevated TSH - probably related to amiodarone. Dose reduced. Will monitor. Hopefully if ICD placed we could stop amiodarone.   7. Acute on CKD. Last creatinine elevated to 1.71. Lasix dose reduced. Does not appear to be volume overloaded.   8. DM per primary care. Probably not a candidate for Jardiance due to cost.      Peter JMartiniqueMD,FACC 03/14/2017 11:09 AM

## 2017-03-13 ENCOUNTER — Ambulatory Visit: Payer: Medicare Other | Attending: Internal Medicine | Admitting: Internal Medicine

## 2017-03-13 ENCOUNTER — Encounter: Payer: Self-pay | Admitting: Internal Medicine

## 2017-03-13 VITALS — BP 122/66 | HR 50 | Temp 98.0°F | Resp 16 | Ht 69.0 in | Wt 206.8 lb

## 2017-03-13 DIAGNOSIS — I255 Ischemic cardiomyopathy: Secondary | ICD-10-CM | POA: Diagnosis not present

## 2017-03-13 DIAGNOSIS — I5022 Chronic systolic (congestive) heart failure: Secondary | ICD-10-CM | POA: Diagnosis not present

## 2017-03-13 DIAGNOSIS — Z87891 Personal history of nicotine dependence: Secondary | ICD-10-CM | POA: Insufficient documentation

## 2017-03-13 DIAGNOSIS — I110 Hypertensive heart disease with heart failure: Secondary | ICD-10-CM | POA: Insufficient documentation

## 2017-03-13 DIAGNOSIS — Z8249 Family history of ischemic heart disease and other diseases of the circulatory system: Secondary | ICD-10-CM | POA: Insufficient documentation

## 2017-03-13 DIAGNOSIS — Z7982 Long term (current) use of aspirin: Secondary | ICD-10-CM | POA: Diagnosis not present

## 2017-03-13 DIAGNOSIS — I25810 Atherosclerosis of coronary artery bypass graft(s) without angina pectoris: Secondary | ICD-10-CM | POA: Insufficient documentation

## 2017-03-13 DIAGNOSIS — I252 Old myocardial infarction: Secondary | ICD-10-CM | POA: Diagnosis not present

## 2017-03-13 DIAGNOSIS — Z79899 Other long term (current) drug therapy: Secondary | ICD-10-CM | POA: Insufficient documentation

## 2017-03-13 DIAGNOSIS — E119 Type 2 diabetes mellitus without complications: Secondary | ICD-10-CM | POA: Diagnosis not present

## 2017-03-13 DIAGNOSIS — E785 Hyperlipidemia, unspecified: Secondary | ICD-10-CM | POA: Insufficient documentation

## 2017-03-13 DIAGNOSIS — R413 Other amnesia: Secondary | ICD-10-CM | POA: Diagnosis not present

## 2017-03-13 MED ORDER — GLUCOSE BLOOD VI STRP: ORAL_STRIP | 12 refills | Status: DC

## 2017-03-13 MED ORDER — TRUE METRIX METER W/DEVICE KIT
PACK | 0 refills | Status: DC
Start: 2017-03-13 — End: 2020-01-24

## 2017-03-13 MED ORDER — TRUEPLUS LANCETS 28G MISC: 2 refills | Status: DC

## 2017-03-13 MED ORDER — GLIMEPIRIDE 2 MG PO TABS: 2.0000 mg | ORAL_TABLET | Freq: Every day | ORAL | 3 refills | Status: DC

## 2017-03-13 MED FILL — TRUE METRIX TEST STRIP: 30 days supply | Qty: 100 | Fill #0

## 2017-03-13 MED FILL — ?GLIMEPIRIDE 2 MG TABLET: 2 | 30 days supply | Qty: 30 | Fill #0

## 2017-03-13 MED FILL — TRUEplus LANCETS 28G MISC: 30 days supply | Qty: 100 | Fill #0

## 2017-03-13 MED FILL — !TRUE METRIX BLOOD GLUCOSE: 30 days supply | Qty: 1 | Fill #0

## 2017-03-13 NOTE — Patient Instructions (Addendum)
Diabetes Mellitus and Nutrition When you have diabetes (diabetes mellitus), it is very important to have healthy eating habits because your blood sugar (glucose) levels are greatly affected by what you eat and drink. Eating healthy foods in the appropriate amounts, at about the same times every day, can help you:  Control your blood glucose.  Lower your risk of heart disease.  Improve your blood pressure.  Reach or maintain a healthy weight.  Every person with diabetes is different, and each person has different needs for a meal plan. Your health care provider may recommend that you work with a diet and nutrition specialist (dietitian) to make a meal plan that is best for you. Your meal plan may vary depending on factors such as:  The calories you need.  The medicines you take.  Your weight.  Your blood glucose, blood pressure, and cholesterol levels.  Your activity level.  Other health conditions you have, such as heart or kidney disease.  How do carbohydrates affect me? Carbohydrates affect your blood glucose level more than any other type of food. Eating carbohydrates naturally increases the amount of glucose in your blood. Carbohydrate counting is a method for keeping track of how many carbohydrates you eat. Counting carbohydrates is important to keep your blood glucose at a healthy level, especially if you use insulin or take certain oral diabetes medicines. It is important to know how many carbohydrates you can safely have in each meal. This is different for every person. Your dietitian can help you calculate how many carbohydrates you should have at each meal and for snack. Foods that contain carbohydrates include:  Bread, cereal, rice, pasta, and crackers.  Potatoes and corn.  Peas, beans, and lentils.  Milk and yogurt.  Fruit and juice.  Desserts, such as cakes, cookies, ice cream, and candy.  How does alcohol affect me? Alcohol can cause a sudden decrease in blood  glucose (hypoglycemia), especially if you use insulin or take certain oral diabetes medicines. Hypoglycemia can be a life-threatening condition. Symptoms of hypoglycemia (sleepiness, dizziness, and confusion) are similar to symptoms of having too much alcohol. If your health care provider says that alcohol is safe for you, follow these guidelines:  Limit alcohol intake to no more than 1 drink per day for nonpregnant women and 2 drinks per day for men. One drink equals 12 oz of beer, 5 oz of wine, or 1 oz of hard liquor.  Do not drink on an empty stomach.  Keep yourself hydrated with water, diet soda, or unsweetened iced tea.  Keep in mind that regular soda, juice, and other mixers may contain a lot of sugar and must be counted as carbohydrates.  What are tips for following this plan? Reading food labels  Start by checking the serving size on the label. The amount of calories, carbohydrates, fats, and other nutrients listed on the label are based on one serving of the food. Many foods contain more than one serving per package.  Check the total grams (g) of carbohydrates in one serving. You can calculate the number of servings of carbohydrates in one serving by dividing the total carbohydrates by 15. For example, if a food has 30 g of total carbohydrates, it would be equal to 2 servings of carbohydrates.  Check the number of grams (g) of saturated and trans fats in one serving. Choose foods that have low or no amount of these fats.  Check the number of milligrams (mg) of sodium in one serving. Most people   should limit total sodium intake to less than 2,300 mg per day.  Always check the nutrition information of foods labeled as "low-fat" or "nonfat". These foods may be higher in added sugar or refined carbohydrates and should be avoided.  Talk to your dietitian to identify your daily goals for nutrients listed on the label. Shopping  Avoid buying canned, premade, or processed foods. These  foods tend to be high in fat, sodium, and added sugar.  Shop around the outside edge of the grocery store. This includes fresh fruits and vegetables, bulk grains, fresh meats, and fresh dairy. Cooking  Use low-heat cooking methods, such as baking, instead of high-heat cooking methods like deep frying.  Cook using healthy oils, such as olive, canola, or sunflower oil.  Avoid cooking with butter, cream, or high-fat meats. Meal planning  Eat meals and snacks regularly, preferably at the same times every day. Avoid going long periods of time without eating.  Eat foods high in fiber, such as fresh fruits, vegetables, beans, and whole grains. Talk to your dietitian about how many servings of carbohydrates you can eat at each meal.  Eat 4-6 ounces of lean protein each day, such as lean meat, chicken, fish, eggs, or tofu. 1 ounce is equal to 1 ounce of meat, chicken, or fish, 1 egg, or 1/4 cup of tofu.  Eat some foods each day that contain healthy fats, such as avocado, nuts, seeds, and fish. Lifestyle   Check your blood glucose regularly.  Exercise at least 30 minutes 5 or more days each week, or as told by your health care provider.  Take medicines as told by your health care provider.  Do not use any products that contain nicotine or tobacco, such as cigarettes and e-cigarettes. If you need help quitting, ask your health care provider.  Work with a counselor or diabetes educator to identify strategies to manage stress and any emotional and social challenges. What are some questions to ask my health care provider?  Do I need to meet with a diabetes educator?  Do I need to meet with a dietitian?  What number can I call if I have questions?  When are the best times to check my blood glucose? Where to find more information:  American Diabetes Association: diabetes.org/food-and-fitness/food  Academy of Nutrition and Dietetics:  www.eatright.org/resources/health/diseases-and-conditions/diabetes  National Institute of Diabetes and Digestive and Kidney Diseases (NIH): www.niddk.nih.gov/health-information/diabetes/overview/diet-eating-physical-activity Summary  A healthy meal plan will help you control your blood glucose and maintain a healthy lifestyle.  Working with a diet and nutrition specialist (dietitian) can help you make a meal plan that is best for you.  Keep in mind that carbohydrates and alcohol have immediate effects on your blood glucose levels. It is important to count carbohydrates and to use alcohol carefully. This information is not intended to replace advice given to you by your health care provider. Make sure you discuss any questions you have with your health care provider. Document Released: 10/04/2004 Document Revised: 02/12/2016 Document Reviewed: 02/12/2016 Elsevier Interactive Patient Education  2018 [employer]  

## 2017-03-13 NOTE — Progress Notes (Signed)
Patient ID: Luis Donaldson, male    DOB: Jul 01, 1945  MRN: [id number]  CC: New Patient (Initial Visit)   Subjective: Luis Donaldson is a 72 y.o. male who presents for new pt visit. His concerns today include:  Pt with hx of CAD s/p CABG in 2000, ICM, tob dep,chronic systolic CHF with EF 95-18% (11/2016) and new dx of DM.  Has not had a PCP in quite sometime  Pt hosp 11/26-29/2018 after presenting with CP, acute dyspnea and hypertensive emergency.  Had recurrent runs of VT.  Required intubation. Pt was cath.  This revealed 2 patent grafts and chronic RCA graft occlusion with good collaterals.  Echo revealed apical and mid-distal inferoapical thinning and akinesis suggesting extensive LAD territory infarct.  Plan is medical management.  Found to have DM.  Pt was told to f/u here for management of DM.  Hosp again 12/12-15/2018 with c/o polyuria, orthopnea and SOB. Found to have bradycardia with frequent runs of NSVT.  Started on Amiodarone.  Baseline TSH nl. He has seen Dr. Martinique in f/u.  Amiodarone dose dec to 200 mg. Losartan increased to 100 mg.  Plan to repeat Echo in 3 mths.  If no improvement in EF, will need ICD  Today:  Doing home P.T Gets winded easily just going to mailbox which is about 200 ft No LE edema. No PND/orthopnea/CP Quit smoking 11/2016  DM:  Vaguely recalls being told he has DM on hosp back in November. He is shock to learn this today.  Reports poor memory since admission to CCU on vent -not on any DM meds -endorses polyuria.  No polydipsia, blurred vision, numbness in extremities Patient Active Problem List   Diagnosis Date Noted  . MI (myocardial infarction) ([place])   . Hypertension   . Hyperlipidemia   . Coronary artery disease   . Ischemic cardiomyopathy   . STEMI (ST elevation myocardial infarction) ([place]) 12/16/2016  . NSVT (nonsustained ventricular tachycardia) ([place]) 12/16/2016  . Respiratory failure ([place]) 12/16/2016  . CAD (coronary artery disease) 12/16/2016    . S/P CABG (coronary artery bypass graft) 12/16/2016  . HLD (hyperlipidemia) 12/16/2016  . Tobacco use 12/16/2016  . Acute systolic CHF (congestive heart failure) ([place]) 12/16/2016  . Paroxysmal ventricular tachycardia ([facility])      Current Outpatient Medications on File Prior to Visit  Medication Sig Dispense Refill  . amiodarone (PACERONE) 200 MG tablet Take 1/2 tablet ( 100 mg ) daily 90 tablet 3  . aspirin EC 81 MG EC tablet Take 1 tablet (81 mg total) by mouth daily. 30 tablet 11  . furosemide (LASIX) 40 MG tablet Take 1/2 tablet ( 20 mg ) daily 90 tablet 3  . losartan (COZAAR) 100 MG tablet Take 1 tablet (100 mg total) by mouth daily. 90 tablet 3  . metoprolol tartrate (LOPRESSOR) 25 MG tablet Take 1/2 tablet ( 12.5 mg ) twice a day 180 tablet 3  . nitroGLYCERIN (NITROSTAT) 0.4 MG SL tablet Place 1 tablet (0.4 mg total) under the tongue every 5 (five) minutes x 3 doses as needed for chest pain. 25 tablet 12  . potassium chloride 20 MEQ TBCR Take 20 mEq by mouth daily. 30 tablet 6  . ranitidine (ZANTAC) 150 MG tablet Take 150 mg by mouth 2 (two) times daily as needed for heartburn. Uses [employer] brand     No current facility-administered medications on file prior to visit.     No Known Allergies  Social History   Socioeconomic History  .  Marital status: Divorced    Spouse name: [name]  . Number of children: [name]  . Years of education: [name]  . Highest education level: [name]  Social Needs  . Financial resource strain: [name]  . Food insecurity - worry: [name]  . Food insecurity - inability: [name]  . Transportation needs - medical: [name]  . Transportation needs - non-medical: [name]  Occupational History  . [name]  Tobacco Use  . Smoking status: Former Smoker    Types: Cigarettes    Last attempt to quit: 12/11/2016    Years since quitting: 0.2  . Smokeless tobacco: Never Used  Substance and Sexual Activity  .  Alcohol use: No    Frequency: Never  . Drug use: No  . Sexual activity: [name]  Other Topics Concern  . [name]  Social History Narrative  . [name]    Family History  Problem Relation Age of Onset  . Heart disease Mother     Past Surgical History:  Procedure Laterality Date  . CORONARY ARTERY BYPASS GRAFT    . LEFT HEART CATH AND CORONARY ANGIOGRAPHY N/A 12/16/2016   Procedure: LEFT HEART CATH AND CORONARY ANGIOGRAPHY;  Surgeon: Martinique, Peter M, MD;  Location: [facility];  Service: Cardiovascular;  Laterality: N/A;    ROS: Review of Systems  Constitutional: Positive for activity change (feels he is not able to do as much as he use to). Negative for appetite change.  Eyes: Negative for visual disturbance.  Respiratory: Positive for shortness of breath.   Cardiovascular: Negative for chest pain and leg swelling.  Gastrointestinal: Negative for abdominal pain and blood in stool.  Genitourinary: Negative for difficulty urinating and hematuria.    PHYSICAL EXAM: BP 122/66   Pulse (!) 50   Temp 98 F (36.7 C) (Oral)   Resp 16   Ht _0  (1.753 m)   Wt 206 lb 12.8 oz (93.8 kg)   SpO2 95%   BMI 30.54 kg/m   Physical Exam  General appearance - alert, well appearing, elderly caucasian male and in no distress Mental status - alert, oriented to person, place, and time Mouth - mucous membranes moist, pharynx normal without lesions Neck - supple, no significant adenopathy Chest - clear to auscultation, no wheezes, rales or rhonchi, symmetric air entry Heart - normal rate, regular rhythm, normal S1, S2, no murmurs, rubs, clicks or gallops Neurological - pt scored 4/5 on MiniCog.  He was able to count backward from 100 to 80 in multiples of 3s Extremities - no LE edema  Results for orders placed or performed in visit on 03/13/17  Comprehensive metabolic panel  Result Value Ref Range   Glucose 186 (H) 65 - 99 mg/dL   BUN 19 8 - 27 mg/dL   Creatinine,  Ser 1.28 (H) 0.76 - 1.27 mg/dL   GFR calc non Af Amer 56 (L) >59 mL/min/1.73   GFR calc Af Amer 65 >59 mL/min/1.73   BUN/Creatinine Ratio 15 10 - 24   Sodium 138 134 - 144 mmol/L   Potassium 5.6 (H) 3.5 - 5.2 mmol/L   Chloride 102 96 - 106 mmol/L   CO2 20 20 - 29 mmol/L   Calcium 9.4 8.6 - 10.2 mg/dL   Total Protein 7.4 6.0 - 8.5 g/dL   Albumin 4.3 3.5 - 4.8 g/dL   Globulin, Total 3.1  1.5 - 4.5 g/dL   Albumin/Globulin Ratio 1.4 1.2 - 2.2   Bilirubin Total 0.8 0.0 - 1.2 mg/dL   Alkaline Phosphatase 223 (H) 39 - 117 IU/L   AST 58 (H) 0 - 40 IU/L   ALT 103 (H) 0 - 44 IU/L   BS 203/ A1C 7.8  ASSESSMENT AND PLAN: 1. New onset type 2 diabetes mellitus ([place])- -discuss diagnosis -start low dose Amaryl.  Did some baseline teaching Will have him return next wk to meet with clinical pharmacist for more teaching.  Advised to get DM testing supplies and bring with him - Comprehensive metabolic panel - POCT X4G - Glucose (CBG) - glimepiride (AMARYL) 2 MG tablet; Take 1 tablet (2 mg total) by mouth daily before breakfast.  Dispense: 30 tablet; Refill: 3 - Blood Glucose Monitoring Suppl (TRUE METRIX METER) w/Device KIT; Use as directed  Dispense: 1 kit; Refill: 0 - glucose blood (TRUE METRIX BLOOD GLUCOSE TEST) test strip; Use as instructed  Dispense: 100 each; Refill: 12 - TRUEPLUS LANCETS 28G MISC; Use as directed  Dispense: 100 each; Refill: 2  2. Decline in verbal memory -may have preceded hospitalization vs post intensive care syndrome.  Did okay today on Minicog.  Will address further on  F/u   3. Coronary artery disease involving coronary bypass graft of native heart without angina pectoris 4. Ischemic cardiomyopathy -followed by cardiology   Patient was given the opportunity to ask questions.  Patient verbalized understanding of the plan and was able to repeat key elements of the plan.   Orders Placed This Encounter  Procedures  . Comprehensive metabolic panel  . POCT A1C  .  Glucose (CBG)     Requested Prescriptions   Signed Prescriptions Disp Refills  . glimepiride (AMARYL) 2 MG tablet 30 tablet 3    Sig: Take 1 tablet (2 mg total) by mouth daily before breakfast.  . Blood Glucose Monitoring Suppl (TRUE METRIX METER) w/Device KIT 1 kit 0    Sig: Use as directed  . glucose blood (TRUE METRIX BLOOD GLUCOSE TEST) test strip 100 each 12    Sig: Use as instructed  . TRUEPLUS LANCETS 28G MISC 100 each 2    Sig: Use as directed    Return in about 1 month (around 04/10/2017).  Karle Plumber, MD, FACP

## 2017-03-14 ENCOUNTER — Encounter: Payer: Self-pay | Admitting: Cardiology

## 2017-03-14 ENCOUNTER — Other Ambulatory Visit: Payer: Self-pay

## 2017-03-14 ENCOUNTER — Ambulatory Visit (INDEPENDENT_AMBULATORY_CARE_PROVIDER_SITE_OTHER): Payer: Medicare Other | Admitting: Cardiology

## 2017-03-14 ENCOUNTER — Telehealth: Payer: Self-pay | Admitting: Internal Medicine

## 2017-03-14 VITALS — BP 140/54 | HR 57 | Ht 69.0 in | Wt 202.0 lb

## 2017-03-14 DIAGNOSIS — I25110 Atherosclerotic heart disease of native coronary artery with unstable angina pectoris: Secondary | ICD-10-CM

## 2017-03-14 DIAGNOSIS — I472 Ventricular tachycardia: Secondary | ICD-10-CM | POA: Diagnosis not present

## 2017-03-14 DIAGNOSIS — I5022 Chronic systolic (congestive) heart failure: Secondary | ICD-10-CM | POA: Diagnosis not present

## 2017-03-14 DIAGNOSIS — E119 Type 2 diabetes mellitus without complications: Secondary | ICD-10-CM | POA: Diagnosis not present

## 2017-03-14 DIAGNOSIS — I25810 Atherosclerosis of coronary artery bypass graft(s) without angina pectoris: Secondary | ICD-10-CM

## 2017-03-14 DIAGNOSIS — E782 Mixed hyperlipidemia: Secondary | ICD-10-CM

## 2017-03-14 DIAGNOSIS — I4729 Other ventricular tachycardia: Secondary | ICD-10-CM

## 2017-03-14 LAB — COMPREHENSIVE METABOLIC PANEL
A/G RATIO: 1.4 (ref 1.2–2.2)
ALT: 103 IU/L — ABNORMAL HIGH (ref 0–44)
AST: 58 IU/L — ABNORMAL HIGH (ref 0–40)
Albumin: 4.3 g/dL (ref 3.5–4.8)
Alkaline Phosphatase: 223 IU/L — ABNORMAL HIGH (ref 39–117)
BUN/Creatinine Ratio: 15 (ref 10–24)
BUN: 19 mg/dL (ref 8–27)
Bilirubin Total: 0.8 mg/dL (ref 0.0–1.2)
CALCIUM: 9.4 mg/dL (ref 8.6–10.2)
CO2: 20 mmol/L (ref 20–29)
Chloride: 102 mmol/L (ref 96–106)
Creatinine, Ser: 1.28 mg/dL — ABNORMAL HIGH (ref 0.76–1.27)
GFR, EST AFRICAN AMERICAN: 65 mL/min/{1.73_m2} (ref >59)
GFR, EST NON AFRICAN AMERICAN: 56 mL/min/{1.73_m2} — AB (ref >59)
GLOBULIN, TOTAL: 3.1 g/dL (ref 1.5–4.5)
Glucose: 186 mg/dL — ABNORMAL HIGH (ref 65–99)
Potassium: 5.6 mmol/L — ABNORMAL HIGH (ref 3.5–5.2)
SODIUM: 138 mmol/L (ref 134–144)
TOTAL PROTEIN: 7.4 g/dL (ref 6.0–8.5)

## 2017-03-14 MED ORDER — ATORVASTATIN CALCIUM 80 MG PO TABS: 40.0000 mg | ORAL_TABLET | Freq: Every day | ORAL | 11 refills | Status: DC

## 2017-03-14 MED ORDER — HYDRALAZINE HCL 25 MG PO TABS: 25.0000 mg | ORAL_TABLET | Freq: Three times a day (TID) | ORAL | 3 refills | Status: DC

## 2017-03-14 NOTE — Telephone Encounter (Signed)
Phone call placed to patient this a.m. regarding results of his blood work.  Patient informed that his potassium is elevated.  I recommend stopping the potassium supplement.  We will recheck potassium level on his next visit.  Patient also informed that his liver enzymes are elevated.  This may be due to the combination of amiodarone and Lipitor.  I have sent his cardiologist Dr. Martinique a message in regards to this inquiring whether we need to decrease the dose of 1 of these medicines or just continue to monitor liver function tests for now.  Patient has an appointment with Dr. Martinique this morning.  I told him to mention the lab results.

## 2017-03-14 NOTE — Patient Instructions (Addendum)
Reduce lipitor to 40 mg daily  Stop taking amlodipine  Add hydralazine 25 mg three times a day for blood pressure  We will schedule you for an Echocardiogram

## 2017-03-16 DIAGNOSIS — R413 Other amnesia: Secondary | ICD-10-CM | POA: Insufficient documentation

## 2017-03-16 DIAGNOSIS — E119 Type 2 diabetes mellitus without complications: Secondary | ICD-10-CM | POA: Insufficient documentation

## 2017-03-17 DIAGNOSIS — I5021 Acute systolic (congestive) heart failure: Secondary | ICD-10-CM | POA: Diagnosis not present

## 2017-03-17 DIAGNOSIS — I252 Old myocardial infarction: Secondary | ICD-10-CM | POA: Diagnosis not present

## 2017-03-17 DIAGNOSIS — I255 Ischemic cardiomyopathy: Secondary | ICD-10-CM | POA: Diagnosis not present

## 2017-03-17 DIAGNOSIS — E119 Type 2 diabetes mellitus without complications: Secondary | ICD-10-CM | POA: Diagnosis not present

## 2017-03-17 DIAGNOSIS — I110 Hypertensive heart disease with heart failure: Secondary | ICD-10-CM | POA: Diagnosis not present

## 2017-03-17 DIAGNOSIS — I2510 Atherosclerotic heart disease of native coronary artery without angina pectoris: Secondary | ICD-10-CM | POA: Diagnosis not present

## 2017-03-17 LAB — POCT GLYCOSYLATED HEMOGLOBIN (HGB A1C): HEMOGLOBIN A1C: 7.8

## 2017-03-17 LAB — GLUCOSE, POCT (MANUAL RESULT ENTRY): POC Glucose: 203 mg/dl — AB (ref 70–99)

## 2017-03-18 DIAGNOSIS — I252 Old myocardial infarction: Secondary | ICD-10-CM | POA: Diagnosis not present

## 2017-03-18 DIAGNOSIS — I110 Hypertensive heart disease with heart failure: Secondary | ICD-10-CM | POA: Diagnosis not present

## 2017-03-18 DIAGNOSIS — E119 Type 2 diabetes mellitus without complications: Secondary | ICD-10-CM | POA: Diagnosis not present

## 2017-03-18 DIAGNOSIS — I5021 Acute systolic (congestive) heart failure: Secondary | ICD-10-CM | POA: Diagnosis not present

## 2017-03-18 DIAGNOSIS — I2510 Atherosclerotic heart disease of native coronary artery without angina pectoris: Secondary | ICD-10-CM | POA: Diagnosis not present

## 2017-03-18 DIAGNOSIS — I255 Ischemic cardiomyopathy: Secondary | ICD-10-CM | POA: Diagnosis not present

## 2017-03-19 DIAGNOSIS — I2510 Atherosclerotic heart disease of native coronary artery without angina pectoris: Secondary | ICD-10-CM | POA: Diagnosis not present

## 2017-03-19 DIAGNOSIS — I110 Hypertensive heart disease with heart failure: Secondary | ICD-10-CM | POA: Diagnosis not present

## 2017-03-19 DIAGNOSIS — I252 Old myocardial infarction: Secondary | ICD-10-CM | POA: Diagnosis not present

## 2017-03-19 DIAGNOSIS — I255 Ischemic cardiomyopathy: Secondary | ICD-10-CM | POA: Diagnosis not present

## 2017-03-19 DIAGNOSIS — E119 Type 2 diabetes mellitus without complications: Secondary | ICD-10-CM | POA: Diagnosis not present

## 2017-03-19 DIAGNOSIS — I5021 Acute systolic (congestive) heart failure: Secondary | ICD-10-CM | POA: Diagnosis not present

## 2017-03-24 DIAGNOSIS — I252 Old myocardial infarction: Secondary | ICD-10-CM | POA: Diagnosis not present

## 2017-03-24 DIAGNOSIS — I2510 Atherosclerotic heart disease of native coronary artery without angina pectoris: Secondary | ICD-10-CM | POA: Diagnosis not present

## 2017-03-24 DIAGNOSIS — I255 Ischemic cardiomyopathy: Secondary | ICD-10-CM | POA: Diagnosis not present

## 2017-03-24 DIAGNOSIS — I5021 Acute systolic (congestive) heart failure: Secondary | ICD-10-CM | POA: Diagnosis not present

## 2017-03-24 DIAGNOSIS — E119 Type 2 diabetes mellitus without complications: Secondary | ICD-10-CM | POA: Diagnosis not present

## 2017-03-24 DIAGNOSIS — I110 Hypertensive heart disease with heart failure: Secondary | ICD-10-CM | POA: Diagnosis not present

## 2017-03-24 MED FILL — ATORVASTATIN 80 MG TABLET: 80 | 30 days supply | Qty: 30 | Fill #0

## 2017-03-24 MED FILL — hydrALAZINE HCL 25 MG TABS: 25 | 30 days supply | Qty: 90 | Fill #0

## 2017-03-25 ENCOUNTER — Ambulatory Visit: Payer: Medicare Other | Attending: Internal Medicine | Admitting: Pharmacist

## 2017-03-25 ENCOUNTER — Encounter: Payer: Self-pay | Admitting: Pharmacist

## 2017-03-25 DIAGNOSIS — E119 Type 2 diabetes mellitus without complications: Secondary | ICD-10-CM | POA: Diagnosis not present

## 2017-03-25 DIAGNOSIS — Z79899 Other long term (current) drug therapy: Secondary | ICD-10-CM | POA: Insufficient documentation

## 2017-03-25 NOTE — Progress Notes (Signed)
    S:     No chief complaint on file.   Patient arrives in good spirits.  Presents for diabetes education.   Patient reports Diabetes was diagnosed last week (he doesn't remember being diagnosed last fall)  Patient reports adherence with medications.  Current diabetes medications include: glimepiride 2 mg daily. He denies any side effects from this medication.  He has been checking his blood sugar at home without any issues. Has noticed that they have decreased every day since he started checking and now has fastings in the lower 100s.   Patient reports significant stress from the diagnosis and has been worried about what to do to get his blood sugars better.  O:  Physical Exam   ROS   Lab Results  Component Value Date   HGBA1C 7.8 03/13/2017   There were no vitals filed for this visit.  Home fasting CBG: 100s-120s  A/P: Diabetes newly diagnosed currently uncontrolled based on A1c of 7.8. Patient denies hypoglycemic events and is able to verbalize appropriate hypoglycemia management plan. Patient reports adherence with medication. Control is suboptimal due to dietary indiscretion and sedentary lifestyle.  Provided diabetes education, including the following: what diabetes is, what A1c is, goal blood glucose levels and goal J1H, complications of diabetes, and dietary modifications, including the plate method, decreased carb intake, and nutrition labels. Patient verbalized that he felt much better after receiving this information and feels more equipped to get his blood glucose down now.   Next A1C anticipated May 2019.    Written patient instructions provided.  Total time in face to face counseling 15 minutes.   Follow up in Pharmacist Clinic Visit PRN, next visit with PCP.   Patient seen with Felicity Pellegrini and Minette Brine, PharmD Candidates

## 2017-03-25 NOTE — Patient Instructions (Signed)
Thanks for coming to see [place]!  Follow up with Dr. Wynetta Emery as directed   Diabetes Mellitus and Nutrition When you have diabetes (diabetes mellitus), it is very important to have healthy eating habits because your blood sugar (glucose) levels are greatly affected by what you eat and drink. Eating healthy foods in the appropriate amounts, at about the same times every day, can help you:  Control your blood glucose.  Lower your risk of heart disease.  Improve your blood pressure.  Reach or maintain a healthy weight.  Every person with diabetes is different, and each person has different needs for a meal plan. Your health care provider may recommend that you work with a diet and nutrition specialist (dietitian) to make a meal plan that is best for you. Your meal plan may vary depending on factors such as:  The calories you need.  The medicines you take.  Your weight.  Your blood glucose, blood pressure, and cholesterol levels.  Your activity level.  Other health conditions you have, such as heart or kidney disease.  How do carbohydrates affect me? Carbohydrates affect your blood glucose level more than any other type of food. Eating carbohydrates naturally increases the amount of glucose in your blood. Carbohydrate counting is a method for keeping track of how many carbohydrates you eat. Counting carbohydrates is important to keep your blood glucose at a healthy level, especially if you use insulin or take certain oral diabetes medicines. It is important to know how many carbohydrates you can safely have in each meal. This is different for every person. Your dietitian can help you calculate how many carbohydrates you should have at each meal and for snack. Foods that contain carbohydrates include:  Bread, cereal, rice, pasta, and crackers.  Potatoes and corn.  Peas, beans, and lentils.  Milk and yogurt.  Fruit and juice.  Desserts, such as cakes, cookies, ice cream, and  candy.  How does alcohol affect me? Alcohol can cause a sudden decrease in blood glucose (hypoglycemia), especially if you use insulin or take certain oral diabetes medicines. Hypoglycemia can be a life-threatening condition. Symptoms of hypoglycemia (sleepiness, dizziness, and confusion) are similar to symptoms of having too much alcohol. If your health care provider says that alcohol is safe for you, follow these guidelines:  Limit alcohol intake to no more than 1 drink per day for nonpregnant women and 2 drinks per day for men. One drink equals 12 oz of beer, 5 oz of wine, or 1 oz of hard liquor.  Do not drink on an empty stomach.  Keep yourself hydrated with water, diet soda, or unsweetened iced tea.  Keep in mind that regular soda, juice, and other mixers may contain a lot of sugar and must be counted as carbohydrates.  What are tips for following this plan? Reading food labels  Start by checking the serving size on the label. The amount of calories, carbohydrates, fats, and other nutrients listed on the label are based on one serving of the food. Many foods contain more than one serving per package.  Check the total grams (g) of carbohydrates in one serving. You can calculate the number of servings of carbohydrates in one serving by dividing the total carbohydrates by 15. For example, if a food has 30 g of total carbohydrates, it would be equal to 2 servings of carbohydrates.  Check the number of grams (g) of saturated and trans fats in one serving. Choose foods that have low or no amount of  these fats.  Check the number of milligrams (mg) of sodium in one serving. Most people should limit total sodium intake to less than 2,300 mg per day.  Always check the nutrition information of foods labeled as "low-fat" or "nonfat". These foods may be higher in added sugar or refined carbohydrates and should be avoided.  Talk to your dietitian to identify your daily goals for nutrients listed  on the label. Shopping  Avoid buying canned, premade, or processed foods. These foods tend to be high in fat, sodium, and added sugar.  Shop around the outside edge of the grocery store. This includes fresh fruits and vegetables, bulk grains, fresh meats, and fresh dairy. Cooking  Use low-heat cooking methods, such as baking, instead of high-heat cooking methods like deep frying.  Cook using healthy oils, such as olive, canola, or sunflower oil.  Avoid cooking with butter, cream, or high-fat meats. Meal planning  Eat meals and snacks regularly, preferably at the same times every day. Avoid going long periods of time without eating.  Eat foods high in fiber, such as fresh fruits, vegetables, beans, and whole grains. Talk to your dietitian about how many servings of carbohydrates you can eat at each meal.  Eat 4-6 ounces of lean protein each day, such as lean meat, chicken, fish, eggs, or tofu. 1 ounce is equal to 1 ounce of meat, chicken, or fish, 1 egg, or 1/4 cup of tofu.  Eat some foods each day that contain healthy fats, such as avocado, nuts, seeds, and fish. Lifestyle   Check your blood glucose regularly.  Exercise at least 30 minutes 5 or more days each week, or as told by your health care provider.  Take medicines as told by your health care provider.  Do not use any products that contain nicotine or tobacco, such as cigarettes and e-cigarettes. If you need help quitting, ask your health care provider.  Work with a Social worker or diabetes educator to identify strategies to manage stress and any emotional and social challenges. What are some questions to ask my health care provider?  Do I need to meet with a diabetes educator?  Do I need to meet with a dietitian?  What number can I call if I have questions?  When are the best times to check my blood glucose? Where to find more information:  American Diabetes Association: diabetes.org/food-and-fitness/food  Academy  of Nutrition and Dietetics: PokerClues.dk  [employer] of Diabetes and Digestive and Kidney Diseases (NIH): ContactWire.be Summary  A healthy meal plan will help you control your blood glucose and maintain a healthy lifestyle.  Working with a diet and nutrition specialist (dietitian) can help you make a meal plan that is best for you.  Keep in mind that carbohydrates and alcohol have immediate effects on your blood glucose levels. It is important to count carbohydrates and to use alcohol carefully. This information is not intended to replace advice given to you by your health care provider. Make sure you discuss any questions you have with your health care provider. Document Released: 10/04/2004 Document Revised: 02/12/2016 Document Reviewed: 02/12/2016 Elsevier Interactive Patient Education  2018 [employer].   Carbohydrate Counting for Diabetes Mellitus, Adult Carbohydrate counting is a method for keeping track of how many carbohydrates you eat. Eating carbohydrates naturally increases the amount of sugar (glucose) in the blood. Counting how many carbohydrates you eat helps keep your blood glucose within normal limits, which helps you manage your diabetes (diabetes mellitus). It is important to know  how many carbohydrates you can safely have in each meal. This is different for every person. A diet and nutrition specialist (registered dietitian) can help you make a meal plan and calculate how many carbohydrates you should have at each meal and snack. Carbohydrates are found in the following foods:  Grains, such as breads and cereals.  Dried beans and soy products.  Starchy vegetables, such as potatoes, peas, and corn.  Fruit and fruit juices.  Milk and yogurt.  Sweets and snack foods, such as cake, cookies, candy, chips, and soft drinks.  How do I count  carbohydrates? There are two ways to count carbohydrates in food. You can use either of the methods or a combination of both. Reading "Nutrition Facts" on packaged food The "Nutrition Facts" list is included on the labels of almost all packaged foods and beverages in the [place] It includes:  The serving size.  Information about nutrients in each serving, including the grams (g) of carbohydrate per serving.  To use the "Nutrition Facts":  Decide how many servings you will have.  Multiply the number of servings by the number of carbohydrates per serving.  The resulting number is the total amount of carbohydrates that you will be having.  Learning standard serving sizes of other foods When you eat foods containing carbohydrates that are not packaged or do not include "Nutrition Facts" on the label, you need to measure the servings in order to count the amount of carbohydrates:  Measure the foods that you will eat with a food scale or measuring cup, if needed.  Decide how many standard-size servings you will eat.  Multiply the number of servings by 15. Most carbohydrate-rich foods have about 15 g of carbohydrates per serving. ? For example, if you eat 8 oz (170 g) of strawberries, you will have eaten 2 servings and 30 g of carbohydrates (2 servings x 15 g = 30 g).  For foods that have more than one food mixed, such as soups and casseroles, you must count the carbohydrates in each food that is included.  The following list contains standard serving sizes of common carbohydrate-rich foods. Each of these servings has about 15 g of carbohydrates:   hamburger bun or  English muffin.   oz (15 mL) syrup.   oz (14 g) jelly.  1 slice of bread.  1 six-inch tortilla.  3 oz (85 g) cooked rice or pasta.  4 oz (113 g) cooked dried beans.  4 oz (113 g) starchy vegetable, such as peas, corn, or potatoes.  4 oz (113 g) hot cereal.  4 oz (113 g) mashed potatoes or  of a large baked  potato.  4 oz (113 g) canned or frozen fruit.  4 oz (120 mL) fruit juice.  4-6 crackers.  6 chicken nuggets.  6 oz (170 g) unsweetened dry cereal.  6 oz (170 g) plain fat-free yogurt or yogurt sweetened with artificial sweeteners.  8 oz (240 mL) milk.  8 oz (170 g) fresh fruit or one small piece of fruit.  24 oz (680 g) popped popcorn.  Example of carbohydrate counting Sample meal  3 oz (85 g) chicken breast.  6 oz (170 g) brown rice.  4 oz (113 g) corn.  8 oz (240 mL) milk.  8 oz (170 g) strawberries with sugar-free whipped topping. Carbohydrate calculation 1. Identify the foods that contain carbohydrates: ? Rice. ? Corn. ? Milk. ? Strawberries. 2. Calculate how many servings you have of each food: ? 2  servings rice. ? 1 serving corn. ? 1 serving milk. ? 1 serving strawberries. 3. Multiply each number of servings by 15 g: ? 2 servings rice x 15 g = 30 g. ? 1 serving corn x 15 g = 15 g. ? 1 serving milk x 15 g = 15 g. ? 1 serving strawberries x 15 g = 15 g. 4. Add together all of the amounts to find the total grams of carbohydrates eaten: ? 30 g + 15 g + 15 g + 15 g = 75 g of carbohydrates total. This information is not intended to replace advice given to you by your health care provider. Make sure you discuss any questions you have with your health care provider. Document Released: 01/07/2005 Document Revised: 07/28/2015 Document Reviewed: 06/21/2015 Elsevier Interactive Patient Education  2018 [employer].   Diabetes Mellitus and Standards of Medical Care Managing diabetes (diabetes mellitus) can be complicated. Your diabetes treatment may be managed by a team of health care providers, including:  A diet and nutrition specialist (registered dietitian).  A nurse.  A certified diabetes educator (CDE).  A diabetes specialist (endocrinologist).  An eye doctor.  A primary care provider.  A dentist.  Your health care providers follow a schedule in  order to help you get the best quality of care. The following schedule is a general guideline for your diabetes management plan. Your health care providers may also give you more specific instructions. HbA1c ( hemoglobin A1c) test This test provides information about blood sugar (glucose) control over the previous 2-3 months. It is used to check whether your diabetes management plan needs to be adjusted.  If you are meeting your treatment goals, this test is done at least 2 times a year.  If you are not meeting treatment goals or if your treatment goals have changed, this test is done 4 times a year.  Blood pressure test  This test is done at every routine medical visit. For most people, the goal is less than 130/80. Ask your health care provider what your goal blood pressure should be. Dental and eye exams  Visit your dentist two times a year.  If you have type 1 diabetes, get an eye exam 3-5 years after you are diagnosed, and then once a year after your first exam. ? If you were diagnosed with type 1 diabetes as a child, get an eye exam when you are age 57 or older and have had diabetes for 3-5 years. After the first exam, you should get an eye exam once a year.  If you have type 2 diabetes, have an eye exam as soon as you are diagnosed, and then once a year after your first exam. Foot care exam  Visual foot exams are done at every routine medical visit. The exams check for cuts, bruises, redness, blisters, sores, or other problems with the feet.  A complete foot exam is done by your health care provider once a year. This exam includes an inspection of the structure and skin of your feet, and a check of the pulses and sensation in your feet. ? Type 1 diabetes: Get your first exam 3-5 years after diagnosis. ? Type 2 diabetes: Get your first exam as soon as you are diagnosed.  Check your feet every day for cuts, bruises, redness, blisters, or sores. If you have any of these or other  problems that are not healing, contact your health care provider. Kidney function test ( urine microalbumin)  This test  is done once a year. ? Type 1 diabetes: Get your first test 5 years after diagnosis. ? Type 2 diabetes: Get your first test as soon as you are diagnosed.  If you have chronic kidney disease (CKD), get a serum creatinine and estimated glomerular filtration rate (eGFR) test once a year. Lipid profile (cholesterol, HDL, LDL, triglycerides)  This test should be done when you are diagnosed with diabetes, and every 5 years after the first test. If you are on medicines to lower your cholesterol, you may need to get this test done every year. ? The goal for LDL is less than 100 mg/dL (5.5 mmol/L). If you are at high risk, the goal is less than 70 mg/dL (3.9 mmol/L). ? The goal for HDL is 40 mg/dL (2.2 mmol/L) for men and 50 mg/dL(2.8 mmol/L) for women. An HDL cholesterol of 60 mg/dL (3.3 mmol/L) or higher gives some protection against heart disease. ? The goal for triglycerides is less than 150 mg/dL (8.3 mmol/L). Immunizations  The yearly flu (influenza) vaccine is recommended for everyone 6 months or older who has diabetes.  The pneumonia (pneumococcal) vaccine is recommended for everyone 2 years or older who has diabetes. If you are 34 or older, you may get the pneumonia vaccine as a series of two separate shots.  The hepatitis B vaccine is recommended for adults shortly after they have been diagnosed with diabetes.  The Tdap (tetanus, diphtheria, and pertussis) vaccine should be given: ? According to normal childhood vaccination schedules, for children. ? Every 10 years, for adults who have diabetes.  The shingles vaccine is recommended for people who have had chicken pox and are 50 years or older. Mental and emotional health  Screening for symptoms of eating disorders, anxiety, and depression is recommended at the time of diagnosis and afterward as needed. If your  screening shows that you have symptoms (you have a positive screening result), you may need further evaluation and be referred to a mental health care provider. Diabetes self-management education  Education about how to manage your diabetes is recommended at diagnosis and ongoing as needed. Treatment plan  Your treatment plan will be reviewed at every medical visit. Summary  Managing diabetes (diabetes mellitus) can be complicated. Your diabetes treatment may be managed by a team of health care providers.  Your health care providers follow a schedule in order to help you get the best quality of care.  Standards of care including having regular physical exams, blood tests, blood pressure monitoring, immunizations, screening tests, and education about how to manage your diabetes.  Your health care providers may also give you more specific instructions based on your individual health. This information is not intended to replace advice given to you by your health care provider. Make sure you discuss any questions you have with your health care provider. Document Released: 11/04/2008 Document Revised: 10/06/2015 Document Reviewed: 10/06/2015 Elsevier Interactive Patient Education  [employer].

## 2017-03-26 ENCOUNTER — Ambulatory Visit (HOSPITAL_COMMUNITY): Payer: Medicare Other | Attending: Cardiology

## 2017-03-26 ENCOUNTER — Other Ambulatory Visit: Payer: Self-pay

## 2017-03-26 DIAGNOSIS — I5022 Chronic systolic (congestive) heart failure: Secondary | ICD-10-CM | POA: Insufficient documentation

## 2017-03-26 DIAGNOSIS — Z87891 Personal history of nicotine dependence: Secondary | ICD-10-CM | POA: Diagnosis not present

## 2017-03-26 DIAGNOSIS — I2510 Atherosclerotic heart disease of native coronary artery without angina pectoris: Secondary | ICD-10-CM | POA: Diagnosis not present

## 2017-03-26 DIAGNOSIS — E782 Mixed hyperlipidemia: Secondary | ICD-10-CM

## 2017-03-26 DIAGNOSIS — I472 Ventricular tachycardia: Secondary | ICD-10-CM | POA: Insufficient documentation

## 2017-03-26 DIAGNOSIS — E119 Type 2 diabetes mellitus without complications: Secondary | ICD-10-CM | POA: Insufficient documentation

## 2017-03-26 DIAGNOSIS — I110 Hypertensive heart disease with heart failure: Secondary | ICD-10-CM | POA: Diagnosis not present

## 2017-03-26 DIAGNOSIS — E785 Hyperlipidemia, unspecified: Secondary | ICD-10-CM | POA: Diagnosis not present

## 2017-03-26 DIAGNOSIS — I252 Old myocardial infarction: Secondary | ICD-10-CM | POA: Insufficient documentation

## 2017-03-26 DIAGNOSIS — I25110 Atherosclerotic heart disease of native coronary artery with unstable angina pectoris: Secondary | ICD-10-CM

## 2017-03-26 DIAGNOSIS — I4729 Other ventricular tachycardia: Secondary | ICD-10-CM

## 2017-03-26 MED ORDER — PERFLUTREN LIPID MICROSPHERE
1.0000 mL | INTRAVENOUS | Status: AC | PRN
  Administered 2017-03-26: 2 mL via INTRAVENOUS

## 2017-03-27 DIAGNOSIS — I252 Old myocardial infarction: Secondary | ICD-10-CM | POA: Diagnosis not present

## 2017-03-27 DIAGNOSIS — I110 Hypertensive heart disease with heart failure: Secondary | ICD-10-CM | POA: Diagnosis not present

## 2017-03-27 DIAGNOSIS — I5021 Acute systolic (congestive) heart failure: Secondary | ICD-10-CM | POA: Diagnosis not present

## 2017-03-27 DIAGNOSIS — I2510 Atherosclerotic heart disease of native coronary artery without angina pectoris: Secondary | ICD-10-CM | POA: Diagnosis not present

## 2017-03-27 DIAGNOSIS — E119 Type 2 diabetes mellitus without complications: Secondary | ICD-10-CM | POA: Diagnosis not present

## 2017-03-27 DIAGNOSIS — I255 Ischemic cardiomyopathy: Secondary | ICD-10-CM | POA: Diagnosis not present

## 2017-03-31 ENCOUNTER — Ambulatory Visit: Payer: Medicare Other | Attending: Internal Medicine | Admitting: Internal Medicine

## 2017-03-31 ENCOUNTER — Encounter: Payer: Self-pay | Admitting: Internal Medicine

## 2017-03-31 VITALS — BP 121/74 | HR 52 | Temp 98.3°F | Resp 16

## 2017-03-31 DIAGNOSIS — I2510 Atherosclerotic heart disease of native coronary artery without angina pectoris: Secondary | ICD-10-CM | POA: Diagnosis not present

## 2017-03-31 DIAGNOSIS — N183 Chronic kidney disease, stage 3 unspecified: Secondary | ICD-10-CM

## 2017-03-31 DIAGNOSIS — E875 Hyperkalemia: Secondary | ICD-10-CM | POA: Insufficient documentation

## 2017-03-31 DIAGNOSIS — E785 Hyperlipidemia, unspecified: Secondary | ICD-10-CM | POA: Diagnosis not present

## 2017-03-31 DIAGNOSIS — Z951 Presence of aortocoronary bypass graft: Secondary | ICD-10-CM | POA: Diagnosis not present

## 2017-03-31 DIAGNOSIS — Z87891 Personal history of nicotine dependence: Secondary | ICD-10-CM | POA: Diagnosis not present

## 2017-03-31 DIAGNOSIS — R945 Abnormal results of liver function studies: Secondary | ICD-10-CM | POA: Diagnosis not present

## 2017-03-31 DIAGNOSIS — I255 Ischemic cardiomyopathy: Secondary | ICD-10-CM | POA: Diagnosis not present

## 2017-03-31 DIAGNOSIS — Z79899 Other long term (current) drug therapy: Secondary | ICD-10-CM | POA: Diagnosis not present

## 2017-03-31 DIAGNOSIS — E119 Type 2 diabetes mellitus without complications: Secondary | ICD-10-CM

## 2017-03-31 DIAGNOSIS — I5022 Chronic systolic (congestive) heart failure: Secondary | ICD-10-CM | POA: Diagnosis not present

## 2017-03-31 DIAGNOSIS — Z7982 Long term (current) use of aspirin: Secondary | ICD-10-CM | POA: Diagnosis not present

## 2017-03-31 DIAGNOSIS — R7989 Other specified abnormal findings of blood chemistry: Secondary | ICD-10-CM

## 2017-03-31 DIAGNOSIS — R413 Other amnesia: Secondary | ICD-10-CM

## 2017-03-31 DIAGNOSIS — E1122 Type 2 diabetes mellitus with diabetic chronic kidney disease: Secondary | ICD-10-CM | POA: Diagnosis not present

## 2017-03-31 DIAGNOSIS — I10 Essential (primary) hypertension: Secondary | ICD-10-CM | POA: Insufficient documentation

## 2017-03-31 DIAGNOSIS — I252 Old myocardial infarction: Secondary | ICD-10-CM | POA: Diagnosis not present

## 2017-03-31 DIAGNOSIS — Z7984 Long term (current) use of oral hypoglycemic drugs: Secondary | ICD-10-CM | POA: Diagnosis not present

## 2017-03-31 LAB — GLUCOSE, POCT (MANUAL RESULT ENTRY): POC Glucose: 135 mg/dL — AB (ref 70–99)

## 2017-03-31 NOTE — Patient Instructions (Signed)
Continue Amaryl for the diabetes.  Try to check your blood sugars several times a week.  Call if blood sugars are too low which would be less than 80.

## 2017-03-31 NOTE — Progress Notes (Signed)
Patient ID: Luis Donaldson, male    DOB: 1945/10/16  MRN: [id number]  CC: DM  Subjective: Luis Donaldson is a 72 y.o. male who presents for 1 mth f/u chronic ds management.  His concerns today include:  Pt with hx of CAD s/p CABG in 2000, ICM, tob dep,chronic systolic CHF with EF 16-38% (11/2016) and new dx of DM.  Has not had a PCP in quite sometime  1.  Still doing home P.T Still feels listless.  Legs feel "rubbery and tingly."  When asked to clarify the rubbery comment, pt stated that legs feel heavy with walking less than 1/2 block.  No pain. -still struggling with memory issues  2.  DM:  Had home nurse check BS several times.  He checked it himself once More mindful of what he eats  3.  ICM/CAD:  Dr. Neita Garnet has cut Lipitor dose in half due to elev LFTs.  Lasix dose also dec Amiodarone dose dec.  Mild TSH elev noted on labs.  He d/c K+ supplement per my recommendation due to elev K+ on labs. Denies any chest pains or lower extremity edema. According to Dr. Doug Sou notes plan is for ICD as EF remain low at 30-35 % on recent Echo Patient Active Problem List   Diagnosis Date Noted  . New onset type 2 diabetes mellitus ([place]) 03/16/2017  . MI (myocardial infarction) ([place])   . Hypertension   . Hyperlipidemia   . Coronary artery disease   . Ischemic cardiomyopathy   . STEMI (ST elevation myocardial infarction) ([place]) 12/16/2016  . NSVT (nonsustained ventricular tachycardia) ([place]) 12/16/2016  . Respiratory failure ([place]) 12/16/2016  . CAD (coronary artery disease) 12/16/2016  . S/P CABG (coronary artery bypass graft) 12/16/2016  . HLD (hyperlipidemia) 12/16/2016  . Tobacco use 12/16/2016  . Acute systolic CHF (congestive heart failure) ([place]) 12/16/2016  . Paroxysmal ventricular tachycardia (H[facility])      Current Outpatient Medications on File Prior to Visit  Medication Sig Dispense Refill  . amiodarone (PACERONE) 200 MG tablet Take 1/2 tablet ( 100 mg ) daily 90 tablet 3  . aspirin EC  81 MG EC tablet Take 1 tablet (81 mg total) by mouth daily. 30 tablet 11  . atorvastatin (LIPITOR) 80 MG tablet Take 0.5 tablets (40 mg total) by mouth daily at 6 PM. 30 tablet 11  . Blood Glucose Monitoring Suppl (TRUE METRIX METER) w/Device KIT Use as directed 1 kit 0  . furosemide (LASIX) 40 MG tablet Take 1/2 tablet ( 20 mg ) daily 90 tablet 3  . glimepiride (AMARYL) 2 MG tablet Take 1 tablet (2 mg total) by mouth daily before breakfast. 30 tablet 3  . glucose blood (TRUE METRIX BLOOD GLUCOSE TEST) test strip Use as instructed 100 each 12  . hydrALAZINE (APRESOLINE) 25 MG tablet Take 1 tablet (25 mg total) by mouth 3 (three) times daily. 270 tablet 3  . losartan (COZAAR) 100 MG tablet Take 1 tablet (100 mg total) by mouth daily. 90 tablet 3  . metoprolol tartrate (LOPRESSOR) 25 MG tablet Take 1/2 tablet ( 12.5 mg ) twice a day 180 tablet 3  . nitroGLYCERIN (NITROSTAT) 0.4 MG SL tablet Place 1 tablet (0.4 mg total) under the tongue every 5 (five) minutes x 3 doses as needed for chest pain. 25 tablet 12  . potassium chloride 20 MEQ TBCR Take 20 mEq by mouth daily. 30 tablet 6  . ranitidine (ZANTAC) 150 MG tablet Take 150 mg by mouth  2 (two) times daily as needed for heartburn. Uses [employer] brand    . TRUEPLUS LANCETS 28G MISC Use as directed 100 each 2   No current facility-administered medications on file prior to visit.     No Known Allergies  Social History   Socioeconomic History  . Marital status: Divorced    Spouse name: [name]  . Number of children: [name]  . Years of education: [name]  . Highest education level: [name]  Social Needs  . Financial resource strain: [name]  . Food insecurity - worry: [name]  . Food insecurity - inability: [name]  . Transportation needs - medical: [name]  . Transportation needs - non-medical: [name]  Occupational History  . [name]  Tobacco Use  . Smoking status: Former Smoker    Types:  Cigarettes    Last attempt to quit: 12/11/2016    Years since quitting: 0.3  . Smokeless tobacco: Never Used  Substance and Sexual Activity  . Alcohol use: No    Frequency: Never  . Drug use: No  . Sexual activity: [name]  Other Topics Concern  . [name]  Social History Narrative  . [name]    Family History  Problem Relation Age of Onset  . Heart disease Mother     Past Surgical History:  Procedure Laterality Date  . CORONARY ARTERY BYPASS GRAFT    . LEFT HEART CATH AND CORONARY ANGIOGRAPHY N/A 12/16/2016   Procedure: LEFT HEART CATH AND CORONARY ANGIOGRAPHY;  Surgeon: Martinique, Peter M, MD;  Location: [facility];  Service: Cardiovascular;  Laterality: N/A;    ROS: Review of Systems Negative except as stated above   PHYSICAL EXAM: BP 121/74   Pulse (!) 52   Temp 98.3 F (36.8 C) (Oral)   Resp 16   SpO2 96%   Physical Exam  General appearance - alert, well appearing, and in no distress Mental status - alert and , oriented. normal mood, behavior, speech, dress, motor activity, and thought processes Chest - clear to auscultation, no wheezes, rales or rhonchi, symmetric air entry Heart - normal rate, regular rhythm, normal S1, S2, no murmurs, rubs, clicks or gallops Extremities -no Le edema.  Peripheral pulses intact.  No cynanosis  Results for orders placed or performed in visit on 03/31/17  POCT glucose (manual entry)  Result Value Ref Range   POC Glucose 135 (A) 70 - 99 mg/dl     Chemistry      Component Value Date/Time   NA 138 03/13/2017 1658   K 5.6 (H) 03/13/2017 1658   CL 102 03/13/2017 1658   CO2 20 03/13/2017 1658   BUN 19 03/13/2017 1658   CREATININE 1.28 (H) 03/13/2017 1658      Component Value Date/Time   CALCIUM 9.4 03/13/2017 1658   ALKPHOS 223 (H) 03/13/2017 1658   AST 58 (H) 03/13/2017 1658   ALT 103 (H) 03/13/2017 1658   BILITOT 0.8 03/13/2017 1658     Lab Results  Component Value Date   WBC 10.4 01/01/2017    HGB 14.5 01/01/2017   HCT 42.3 01/01/2017   MCV 94.4 01/01/2017   PLT 164 01/01/2017    ASSESSMENT AND PLAN: 1. New onset type 2 diabetes mellitus (HCC) Continue Amaryl.  Patient will continue to check blood sugars several times a week.  I went over symptoms of hypoglycemia and  how to treat if it occurs. - POCT glucose (manual entry)  2. CKD (chronic kidney disease) stage 3, GFR 30-59 ml/min (HCC) Avoid nephrotoxic medications. - Comprehensive metabolic panel  3. Memory changes This remains a significant issue for him.  I recommend referral for neuropsych evaluation or to a neurologist but patient declines at this time.  4. Abnormal LFTs - Comprehensive metabolic panel  5. Hyperkalemia - Comprehensive metabolic panel  6. Ischemic cardiomyopathy Followed by cardiology.  Patient was given the opportunity to ask questions.  Patient verbalized understanding of the plan and was able to repeat key elements of the plan.   Orders Placed This Encounter  Procedures  . Comprehensive metabolic panel  . POCT glucose (manual entry)     Requested Prescriptions    No prescriptions requested or ordered in this encounter    Return in about 3 months (around 07/01/2017).  Karle Plumber, MD, FACP

## 2017-04-01 ENCOUNTER — Telehealth: Payer: Self-pay

## 2017-04-01 LAB — COMPREHENSIVE METABOLIC PANEL
ALT: 80 IU/L — ABNORMAL HIGH (ref 0–44)
AST: 62 IU/L — AB (ref 0–40)
Albumin/Globulin Ratio: 1.3 (ref 1.2–2.2)
Albumin: 4 g/dL (ref 3.5–4.8)
Alkaline Phosphatase: 221 IU/L — ABNORMAL HIGH (ref 39–117)
BUN/Creatinine Ratio: 14 (ref 10–24)
BUN: 16 mg/dL (ref 8–27)
Bilirubin Total: 1 mg/dL (ref 0.0–1.2)
CALCIUM: 9.1 mg/dL (ref 8.6–10.2)
CO2: 19 mmol/L — AB (ref 20–29)
CREATININE: 1.12 mg/dL (ref 0.76–1.27)
Chloride: 104 mmol/L (ref 96–106)
GFR, EST AFRICAN AMERICAN: 76 mL/min/{1.73_m2} (ref >59)
GFR, EST NON AFRICAN AMERICAN: 66 mL/min/{1.73_m2} (ref >59)
GLOBULIN, TOTAL: 3.2 g/dL (ref 1.5–4.5)
Glucose: 108 mg/dL — ABNORMAL HIGH (ref 65–99)
Potassium: 4.3 mmol/L (ref 3.5–5.2)
Sodium: 142 mmol/L (ref 134–144)
TOTAL PROTEIN: 7.2 g/dL (ref 6.0–8.5)

## 2017-04-01 NOTE — Telephone Encounter (Signed)
Contacted pt to go over lab results pt is aware and doesn't have any questions or concerns 

## 2017-04-02 DIAGNOSIS — I2510 Atherosclerotic heart disease of native coronary artery without angina pectoris: Secondary | ICD-10-CM | POA: Diagnosis not present

## 2017-04-02 DIAGNOSIS — I110 Hypertensive heart disease with heart failure: Secondary | ICD-10-CM | POA: Diagnosis not present

## 2017-04-02 DIAGNOSIS — I252 Old myocardial infarction: Secondary | ICD-10-CM | POA: Diagnosis not present

## 2017-04-02 DIAGNOSIS — I255 Ischemic cardiomyopathy: Secondary | ICD-10-CM | POA: Diagnosis not present

## 2017-04-02 DIAGNOSIS — E119 Type 2 diabetes mellitus without complications: Secondary | ICD-10-CM | POA: Diagnosis not present

## 2017-04-02 DIAGNOSIS — I5021 Acute systolic (congestive) heart failure: Secondary | ICD-10-CM | POA: Diagnosis not present

## 2017-04-03 DIAGNOSIS — I252 Old myocardial infarction: Secondary | ICD-10-CM | POA: Diagnosis not present

## 2017-04-03 DIAGNOSIS — I110 Hypertensive heart disease with heart failure: Secondary | ICD-10-CM | POA: Diagnosis not present

## 2017-04-03 DIAGNOSIS — E119 Type 2 diabetes mellitus without complications: Secondary | ICD-10-CM | POA: Diagnosis not present

## 2017-04-03 DIAGNOSIS — I2510 Atherosclerotic heart disease of native coronary artery without angina pectoris: Secondary | ICD-10-CM | POA: Diagnosis not present

## 2017-04-03 DIAGNOSIS — I255 Ischemic cardiomyopathy: Secondary | ICD-10-CM | POA: Diagnosis not present

## 2017-04-03 DIAGNOSIS — I5021 Acute systolic (congestive) heart failure: Secondary | ICD-10-CM | POA: Diagnosis not present

## 2017-04-07 ENCOUNTER — Encounter: Payer: Self-pay | Admitting: Internal Medicine

## 2017-04-07 ENCOUNTER — Ambulatory Visit (INDEPENDENT_AMBULATORY_CARE_PROVIDER_SITE_OTHER): Payer: Medicare Other | Admitting: Internal Medicine

## 2017-04-07 VITALS — BP 124/58 | HR 52 | Ht 69.0 in | Wt 205.0 lb

## 2017-04-07 DIAGNOSIS — I472 Ventricular tachycardia: Secondary | ICD-10-CM

## 2017-04-07 DIAGNOSIS — Z951 Presence of aortocoronary bypass graft: Secondary | ICD-10-CM

## 2017-04-07 DIAGNOSIS — I5022 Chronic systolic (congestive) heart failure: Secondary | ICD-10-CM

## 2017-04-07 DIAGNOSIS — I4729 Other ventricular tachycardia: Secondary | ICD-10-CM

## 2017-04-07 DIAGNOSIS — I10 Essential (primary) hypertension: Secondary | ICD-10-CM | POA: Diagnosis not present

## 2017-04-07 LAB — CBC WITH DIFFERENTIAL/PLATELET
Basophils Absolute: 0.1 10*3/uL (ref 0.0–0.2)
Basos: 1 %
EOS (ABSOLUTE): 0.6 10*3/uL — ABNORMAL HIGH (ref 0.0–0.4)
EOS: 8 %
HEMATOCRIT: 37 % — AB (ref 37.5–51.0)
HEMOGLOBIN: 12.7 g/dL — AB (ref 13.0–17.7)
IMMATURE GRANULOCYTES: 0 %
Immature Grans (Abs): 0 10*3/uL (ref 0.0–0.1)
LYMPHS ABS: 1.9 10*3/uL (ref 0.7–3.1)
LYMPHS: 23 %
MCH: 31.8 pg (ref 26.6–33.0)
MCHC: 34.3 g/dL (ref 31.5–35.7)
MCV: 93 fL (ref 79–97)
MONOCYTES: 14 %
Monocytes Absolute: 1.2 10*3/uL — ABNORMAL HIGH (ref 0.1–0.9)
NEUTROS PCT: 54 %
Neutrophils Absolute: 4.5 10*3/uL (ref 1.4–7.0)
Platelets: 201 10*3/uL (ref 150–379)
RBC: 4 x10E6/uL — AB (ref 4.14–5.80)
RDW: 14.9 % (ref 12.3–15.4)
WBC: 8.4 10*3/uL (ref 3.4–10.8)

## 2017-04-07 LAB — BASIC METABOLIC PANEL
BUN/Creatinine Ratio: 19 (ref 10–24)
BUN: 25 mg/dL (ref 8–27)
CALCIUM: 9.5 mg/dL (ref 8.6–10.2)
CHLORIDE: 101 mmol/L (ref 96–106)
CO2: 21 mmol/L (ref 20–29)
CREATININE: 1.35 mg/dL — AB (ref 0.76–1.27)
GFR calc Af Amer: 61 mL/min/{1.73_m2} (ref >59)
GFR calc non Af Amer: 52 mL/min/{1.73_m2} — ABNORMAL LOW (ref >59)
GLUCOSE: 134 mg/dL — AB (ref 65–99)
Potassium: 5.2 mmol/L (ref 3.5–5.2)
Sodium: 137 mmol/L (ref 134–144)

## 2017-04-07 NOTE — Progress Notes (Signed)
    HPI Luis Donaldson is referred today by Dr. Jordan to consider insertion of a primary prevention ICD. He is a pleasant 72 yo man with longstanding CAD, s/p CABG remotely. The patient developed worsening CHF and underwent left heart cath several weeks ago where he was found to have and EF of 25%, severe 3 vessel native CAD, and 2 of 3 patent grafts. He does not have angina and his CHF is class 2. He has not had syncope. No edema. He is on maximal medical therapy although his beta blocker has been limited by sinus bradycardia. Because of frequent bouts of PVC and NSVT he was placed on low dose amiodarone. He has not had syncope. [name]   Current Outpatient Medications  Medication Sig Dispense Refill  . amiodarone (PACERONE) 200 MG tablet Take 1/2 tablet ( 100 mg ) daily 90 tablet 3  . aspirin EC 81 MG EC tablet Take 1 tablet (81 mg total) by mouth daily. 30 tablet 11  . atorvastatin (LIPITOR) 80 MG tablet Take 0.5 tablets (40 mg total) by mouth daily at 6 PM. 30 tablet 11  . Blood Glucose Monitoring Suppl (TRUE METRIX METER) w/Device KIT Use as directed 1 kit 0  . furosemide (LASIX) 40 MG tablet Take 1/2 tablet ( 20 mg ) daily 90 tablet 3  . glimepiride (AMARYL) 2 MG tablet Take 1 tablet (2 mg total) by mouth daily before breakfast. 30 tablet 3  . glucose blood (TRUE METRIX BLOOD GLUCOSE TEST) test strip Use as instructed 100 each 12  . hydrALAZINE (APRESOLINE) 25 MG tablet Take 1 tablet (25 mg total) by mouth 3 (three) times daily. 270 tablet 3  . losartan (COZAAR) 100 MG tablet Take 1 tablet (100 mg total) by mouth daily. 90 tablet 3  . metoprolol tartrate (LOPRESSOR) 25 MG tablet Take 1/2 tablet ( 12.5 mg ) twice a day 180 tablet 3  . nitroGLYCERIN (NITROSTAT) 0.4 MG SL tablet Place 1 tablet (0.4 mg total) under the tongue every 5 (five) minutes x 3 doses as needed for chest pain. 25 tablet 12  . potassium chloride 20 MEQ TBCR Take 20 mEq by mouth daily. 30 tablet 6  . ranitidine  (ZANTAC) 150 MG tablet Take 150 mg by mouth 2 (two) times daily as needed for heartburn. Uses [employer] brand    . TRUEPLUS LANCETS 28G MISC Use as directed 100 each 2   No current facility-administered medications for this visit.      Past Medical History:  Diagnosis Date  . Coronary artery disease   . Hyperlipidemia   . Hypertension   . MI (myocardial infarction) ([facility])     ROS:   All systems reviewed and negative except as noted in the HPI.   Past Surgical History:  Procedure Laterality Date  . CORONARY ARTERY BYPASS GRAFT    . LEFT HEART CATH AND CORONARY ANGIOGRAPHY N/A 12/16/2016   Procedure: LEFT HEART CATH AND CORONARY ANGIOGRAPHY;  Donaldson: Jordan, Peter M, MD;  Location: [facility];  Service: Cardiovascular;  Laterality: N/A;     Family History  Problem Relation Age of Onset  . Heart disease Mother      Social History   Socioeconomic History  . Marital status: Divorced    Spouse name: [name]  . Number of children: [name]  . Years of education: [name]  . Highest education level: [name]  Social Needs  . Financial resource   strain: [name]  . Food insecurity - worry: [name]  . Food insecurity - inability: [name]  . Transportation needs - medical: [name]  . Transportation needs - non-medical: [name]  Occupational History  . [name]  Tobacco Use  . Smoking status: Former Smoker    Types: Cigarettes    Last attempt to quit: 12/11/2016    Years since quitting: 0.3  . Smokeless tobacco: Never Used  Substance and Sexual Activity  . Alcohol use: No    Frequency: Never  . Drug use: No  . Sexual activity: [name]  Other Topics Concern  . [name]  Social History Narrative  . [name]     BP (!) 124/58   Pulse (!) 52   Ht 5' 9" (1.753 m)   Wt 205 lb (93 kg)   BMI 30.27 kg/m   Physical Exam:  Well appearing 72 yo man, NAD HEENT: Unremarkable Neck:  6 cm JVD, no  thyromegally Lymphatics:  No adenopathy Back:  No CVA tenderness Lungs:  Clear with no wheezes HEART:  Regular rate rhythm, no murmurs, no rubs, no clicks Abd:  soft, positive bowel sounds, no organomegally, no rebound, no guarding Ext:  2 plus pulses, no edema, no cyanosis, no clubbing Skin:  No rashes no nodules Neuro:  CN II through XII intact, motor grossly intact  EKG - sinus bradycardia with first degree AV block  DEVICE  Normal device function.  See PaceArt for details.   Assess/Plan: 1. Chronic systolic heart failure - he is on maximal medical therapy with beta blocker and ACE inhibitor and his EF is low. I have discussed the treatment options with the patient and the risks/benefits/goalse/expectations of ICD insertion were discussed and he wishes to proceed.  2. CAD - he has severe CAD with mostly patent grafts. He denies anginal symptoms. He will continue his current meds. 3. Sinus node dysfunction - he has significant bradycardia, and I will plan to place a DDD ICD at implant.  Leelynd Maldonado,M.D. 

## 2017-04-07 NOTE — Patient Instructions (Addendum)
Medication Instructions:  Your physician recommends that you continue on your current medications as directed. Please refer to the Current Medication list given to you today.  Labwork: You will get lab work today:  BMP and CBC.  Testing/Procedures: Your physician has recommended that you have a defibrillator inserted. An implantable cardioverter defibrillator (ICD) is a small device that is placed in your chest or, in rare cases, your abdomen. This device uses electrical pulses or shocks to help control life-threatening, irregular heartbeats that could lead the heart to suddenly stop beating (sudden cardiac arrest). Leads are attached to the ICD that goes into your heart. This is done in the hospital and usually requires an overnight stay. Please see the instruction sheet given to you today for more information.  Follow-Up: You will follow up with device clinic 10-14 days after your procedure for a wound check.  You will follow up with Dr. Lovena Le 91 days after your procedure.  Any Other Special Instructions Will Be Listed Below (If Applicable).  Please arrive at the [facility] main entrance of [place] hospital at:  10:00 am on April 14, 2017. Use the CHG scrub as directed. Do not eat or drink after midnight prior to procedure. Take your AM medications except for:  LASIX and GLIMEPIRIDE (AMARYL). Plan for one night stay.  You will need someone to drive you home at discharge.  If you need a refill on your cardiac medications before your next appointment, please call your pharmacy.   Cardioverter Defibrillator Implantation An implantable cardioverter defibrillator (ICD) is a small device that is placed under the skin in the chest or abdomen. An ICD consists of a battery, a small computer (pulse generator), and wires (leads) that go into the heart. An ICD is used to detect and correct two types of dangerous irregular heartbeats (arrhythmias):  A rapid heart rhythm (tachycardia).  An  arrhythmia in which the lower chambers of the heart (ventricles) contract in an uncoordinated way (fibrillation).  When an ICD detects tachycardia, it sends a low-energy shock to the heart to restore the heartbeat to normal (cardioversion). This signal is usually painless. If cardioversion does not work or if the ICD detects fibrillation, it delivers a high-energy shock to the heart (defibrillation) to restart the heart. This shock may feel like a strong jolt in the chest. Your health care provider may prescribe an ICD if:  You have had an arrhythmia that originated in the ventricles.  Your heart has been damaged by a disease or heart condition.  Sometimes, ICDs are programmed to act as a device called a pacemaker. Pacemakers can be used to treat a slow heartbeat (bradycardia) or tachycardia by taking over the heart rate with electrical impulses. Tell a health care provider about:  Any allergies you have.  All medicines you are taking, including vitamins, herbs, eye drops, creams, and over-the-counter medicines.  Any problems you or family members have had with anesthetic medicines.  Any blood disorders you have.  Any surgeries you have had.  Any medical conditions you have.  Whether you are pregnant or may be pregnant. What are the risks? Generally, this is a safe procedure. However, problems may occur, including:  Swelling, bleeding, or bruising.  Infection.  Blood clots.  Damage to other structures or organs, such as nerves, blood vessels, or the heart.  Allergic reactions to medicines used during the procedure.  What happens before the procedure? Staying hydrated Follow instructions from your health care provider about hydration, which may include:  Up to 2 hours before the procedure - you may continue to drink clear liquids, such as water, clear fruit juice, black coffee, and plain tea.  Eating and drinking restrictions Follow instructions from your health care  provider about eating and drinking, which may include:  8 hours before the procedure - stop eating heavy meals or foods such as meat, fried foods, or fatty foods.  6 hours before the procedure - stop eating light meals or foods, such as toast or cereal.  6 hours before the procedure - stop drinking milk or drinks that contain milk.  2 hours before the procedure - stop drinking clear liquids.  Medicine Ask your health care provider about:  Changing or stopping your normal medicines. This is important if you take diabetes medicines or blood thinners.  Taking medicines such as aspirin and ibuprofen. These medicines can thin your blood. Do not take these medicines before your procedure if your doctor tells you not to.  Tests  You may have blood tests.  You may have a test to check the electrical signals in your heart (electrocardiogram, ECG).  You may have imaging tests, such as a chest X-ray. General instructions  For 24 hours before the procedure, stop using products that contain nicotine or tobacco, such as cigarettes and e-cigarettes. If you need help quitting, ask your health care provider.  Plan to have someone take you home from the hospital or clinic.  You may be asked to shower with a germ-killing soap. What happens during the procedure?  To reduce your risk of infection: ? Your health care team will wash or sanitize their hands. ? Your skin will be washed with soap. ? Hair may be removed from the surgical area.  Small monitors will be put on your body. They will be used to check your heart, blood pressure, and oxygen level.  An IV tube will be inserted into one of your veins.  You will be given one or more of the following: ? A medicine to help you relax (sedative). ? A medicine to numb the area (local anesthetic). ? A medicine to make you fall asleep (general anesthetic).  Leads will be guided through a blood vessel into your heart and attached to your heart  muscles. Depending on the ICD, the leads may go into one ventricle or they may go into both ventricles and into an upper chamber of the heart. An X-ray machine (fluoroscope) will be usedto help guide the leads.  A small incision will be made to create a deep pocket under your skin.  The pulse generator will be placed into the pocket.  The ICD will be tested.  The incision will be closed with stitches (sutures), skin glue, or staples.  A bandage (dressing) will be placed over the incision. This procedure may vary among health care providers and hospitals. What happens after the procedure?  Your blood pressure, heart rate, breathing rate, and blood oxygen level will be monitored often until the medicines you were given have worn off.  A chest X-ray will be taken to check that the ICD is in the right place.  You will need to stay in the hospital for 1-2 days so your health care provider can make sure your ICD is working.  Do not drive for 24 hours if you received a sedative. Ask your health care provider when it is safe for you to drive.  You may be given an identification card explaining that you have an ICD. Summary  An implantable cardioverter defibrillator (ICD) is a small device that is placed under the skin in the chest or abdomen. It is used to detect and correct dangerous irregular heartbeats (arrhythmias).  An ICD consists of a battery, a small computer (pulse generator), and wires (leads) that go into the heart.  When an ICD detects rapid heart rhythm (tachycardia), it sends a low-energy shock to the heart to restore the heartbeat to normal (cardioversion). If cardioversion does not work or if the ICD detects uncoordinated heart contractions (fibrillation), it delivers a high-energy shock to the heart (defibrillation) to restart the heart.  You will need to stay in the hospital for 1-2 days to make sure your ICD is working. This information is not intended to replace advice  given to you by your health care provider. Make sure you discuss any questions you have with your health care provider. Document Released: 09/29/2001 Document Revised: 01/17/2016 Document Reviewed: 01/17/2016 Elsevier Interactive Patient Education  2017 [employer].

## 2017-04-07 NOTE — H&P (View-Only) (Signed)
HPI Luis Donaldson is referred today by Dr. Martinique to consider insertion of a primary prevention ICD. He is a pleasant 72 yo man with longstanding CAD, s/p CABG remotely. The patient developed worsening CHF and underwent left heart cath several weeks ago where he was found to have and EF of 25%, severe 3 vessel native CAD, and 2 of 3 patent grafts. He does not have angina and his CHF is class 2. He has not had syncope. No edema. He is on maximal medical therapy although his beta blocker has been limited by sinus bradycardia. Because of frequent bouts of PVC and NSVT he was placed on low dose amiodarone. He has not had syncope. [name]   Current Outpatient Medications  Medication Sig Dispense Refill  . amiodarone (PACERONE) 200 MG tablet Take 1/2 tablet ( 100 mg ) daily 90 tablet 3  . aspirin EC 81 MG EC tablet Take 1 tablet (81 mg total) by mouth daily. 30 tablet 11  . atorvastatin (LIPITOR) 80 MG tablet Take 0.5 tablets (40 mg total) by mouth daily at 6 PM. 30 tablet 11  . Blood Glucose Monitoring Suppl (TRUE METRIX METER) w/Device KIT Use as directed 1 kit 0  . furosemide (LASIX) 40 MG tablet Take 1/2 tablet ( 20 mg ) daily 90 tablet 3  . glimepiride (AMARYL) 2 MG tablet Take 1 tablet (2 mg total) by mouth daily before breakfast. 30 tablet 3  . glucose blood (TRUE METRIX BLOOD GLUCOSE TEST) test strip Use as instructed 100 each 12  . hydrALAZINE (APRESOLINE) 25 MG tablet Take 1 tablet (25 mg total) by mouth 3 (three) times daily. 270 tablet 3  . losartan (COZAAR) 100 MG tablet Take 1 tablet (100 mg total) by mouth daily. 90 tablet 3  . metoprolol tartrate (LOPRESSOR) 25 MG tablet Take 1/2 tablet ( 12.5 mg ) twice a day 180 tablet 3  . nitroGLYCERIN (NITROSTAT) 0.4 MG SL tablet Place 1 tablet (0.4 mg total) under the tongue every 5 (five) minutes x 3 doses as needed for chest pain. 25 tablet 12  . potassium chloride 20 MEQ TBCR Take 20 mEq by mouth daily. 30 tablet 6  . ranitidine  (ZANTAC) 150 MG tablet Take 150 mg by mouth 2 (two) times daily as needed for heartburn. Uses [employer] brand    . TRUEPLUS LANCETS 28G MISC Use as directed 100 each 2   No current facility-administered medications for this visit.      Past Medical History:  Diagnosis Date  . Coronary artery disease   . Hyperlipidemia   . Hypertension   . MI (myocardial infarction) ([place])     ROS:   All systems reviewed and negative except as noted in the HPI.   Past Surgical History:  Procedure Laterality Date  . CORONARY ARTERY BYPASS GRAFT    . LEFT HEART CATH AND CORONARY ANGIOGRAPHY N/A 12/16/2016   Procedure: LEFT HEART CATH AND CORONARY ANGIOGRAPHY;  Surgeon: Martinique, Peter M, MD;  Location: [facility];  Service: Cardiovascular;  Laterality: N/A;     Family History  Problem Relation Age of Onset  . Heart disease Mother      Social History   Socioeconomic History  . Marital status: Divorced    Spouse name: [name]  . Number of children: [name]  . Years of education: [name]  . Highest education level: [name]  Social Needs  . Emergency planning/management officer  strain: [name]  . Food insecurity - worry: [name]  . Food insecurity - inability: [name]  . Transportation needs - medical: [name]  . Transportation needs - non-medical: [name]  Occupational History  . [name]  Tobacco Use  . Smoking status: Former Smoker    Types: Cigarettes    Last attempt to quit: 12/11/2016    Years since quitting: 0.3  . Smokeless tobacco: Never Used  Substance and Sexual Activity  . Alcohol use: No    Frequency: Never  . Drug use: No  . Sexual activity: [name]  Other Topics Concern  . [name]  Social History Narrative  . [name]     BP (!) 124/58   Pulse (!) 52   Ht _0  (1.753 m)   Wt 205 lb (93 kg)   BMI 30.27 kg/m   Physical Exam:  Well appearing 72 yo man, NAD HEENT: Unremarkable Neck:  6 cm JVD, no  thyromegally Lymphatics:  No adenopathy Back:  No CVA tenderness Lungs:  Clear with no wheezes HEART:  Regular rate rhythm, no murmurs, no rubs, no clicks Abd:  soft, positive bowel sounds, no organomegally, no rebound, no guarding Ext:  2 plus pulses, no edema, no cyanosis, no clubbing Skin:  No rashes no nodules Neuro:  CN II through XII intact, motor grossly intact  EKG - sinus bradycardia with first degree AV block  DEVICE  Normal device function.  See PaceArt for details.   Assess/Plan: 1. Chronic systolic heart failure - he is on maximal medical therapy with beta blocker and ACE inhibitor and his EF is low. I have discussed the treatment options with the patient and the risks/benefits/goalse/expectations of ICD insertion were discussed and he wishes to proceed.  2. CAD - he has severe CAD with mostly patent grafts. He denies anginal symptoms. He will continue his current meds. 3. Sinus node dysfunction - he has significant bradycardia, and I will plan to place a DDD ICD at implant.  Mikle Bosworth.D.

## 2017-04-08 ENCOUNTER — Ambulatory Visit: Payer: Medicare Other | Attending: Internal Medicine

## 2017-04-08 MED FILL — GLIMEPIRIDE 2 MG TABLET: 2 | 30 days supply | Qty: 30 | Fill #1 | Status: TO

## 2017-04-09 DIAGNOSIS — I110 Hypertensive heart disease with heart failure: Secondary | ICD-10-CM | POA: Diagnosis not present

## 2017-04-09 DIAGNOSIS — I255 Ischemic cardiomyopathy: Secondary | ICD-10-CM | POA: Diagnosis not present

## 2017-04-09 DIAGNOSIS — I5021 Acute systolic (congestive) heart failure: Secondary | ICD-10-CM | POA: Diagnosis not present

## 2017-04-09 DIAGNOSIS — I252 Old myocardial infarction: Secondary | ICD-10-CM | POA: Diagnosis not present

## 2017-04-09 DIAGNOSIS — I2510 Atherosclerotic heart disease of native coronary artery without angina pectoris: Secondary | ICD-10-CM | POA: Diagnosis not present

## 2017-04-09 DIAGNOSIS — E119 Type 2 diabetes mellitus without complications: Secondary | ICD-10-CM | POA: Diagnosis not present

## 2017-04-10 ENCOUNTER — Telehealth (HOSPITAL_COMMUNITY): Payer: Self-pay | Admitting: Vascular Surgery

## 2017-04-10 DIAGNOSIS — I2510 Atherosclerotic heart disease of native coronary artery without angina pectoris: Secondary | ICD-10-CM | POA: Diagnosis not present

## 2017-04-10 DIAGNOSIS — I255 Ischemic cardiomyopathy: Secondary | ICD-10-CM | POA: Diagnosis not present

## 2017-04-10 DIAGNOSIS — I252 Old myocardial infarction: Secondary | ICD-10-CM | POA: Diagnosis not present

## 2017-04-10 DIAGNOSIS — E119 Type 2 diabetes mellitus without complications: Secondary | ICD-10-CM | POA: Diagnosis not present

## 2017-04-10 DIAGNOSIS — I110 Hypertensive heart disease with heart failure: Secondary | ICD-10-CM | POA: Diagnosis not present

## 2017-04-10 DIAGNOSIS — I5021 Acute systolic (congestive) heart failure: Secondary | ICD-10-CM | POA: Diagnosis not present

## 2017-04-10 NOTE — Telephone Encounter (Signed)
Left pt message to make new chf appt next ava is fine , per Dr. Dr. Martinique

## 2017-04-11 ENCOUNTER — Telehealth (HOSPITAL_COMMUNITY): Payer: Self-pay | Admitting: Vascular Surgery

## 2017-04-11 NOTE — Telephone Encounter (Signed)
Left pt message to make NEW CHF appt per Martinique next ava.

## 2017-04-14 ENCOUNTER — Ambulatory Visit (HOSPITAL_COMMUNITY)
Admission: RE | Disposition: A | Discharge status: Home / Self Care | Payer: Self-pay | Source: Ambulatory Visit | Attending: Internal Medicine

## 2017-04-14 ENCOUNTER — Other Ambulatory Visit: Payer: Self-pay

## 2017-04-14 ENCOUNTER — Encounter (HOSPITAL_COMMUNITY): Payer: Self-pay

## 2017-04-14 ENCOUNTER — Ambulatory Visit (HOSPITAL_COMMUNITY)
Admission: RE | Admit: 2017-04-14 | Discharge: 2017-04-15 | Disposition: A | Discharge status: Home / Self Care | Payer: Medicare Other | Source: Ambulatory Visit | Attending: Internal Medicine | Admitting: Internal Medicine

## 2017-04-14 DIAGNOSIS — E785 Hyperlipidemia, unspecified: Secondary | ICD-10-CM | POA: Insufficient documentation

## 2017-04-14 DIAGNOSIS — Z79899 Other long term (current) drug therapy: Secondary | ICD-10-CM | POA: Diagnosis not present

## 2017-04-14 DIAGNOSIS — Z006 Encounter for examination for normal comparison and control in clinical research program: Secondary | ICD-10-CM | POA: Insufficient documentation

## 2017-04-14 DIAGNOSIS — I255 Ischemic cardiomyopathy: Secondary | ICD-10-CM | POA: Diagnosis not present

## 2017-04-14 DIAGNOSIS — Z951 Presence of aortocoronary bypass graft: Secondary | ICD-10-CM | POA: Diagnosis not present

## 2017-04-14 DIAGNOSIS — I5022 Chronic systolic (congestive) heart failure: Secondary | ICD-10-CM | POA: Diagnosis present

## 2017-04-14 DIAGNOSIS — I472 Ventricular tachycardia, unspecified: Secondary | ICD-10-CM

## 2017-04-14 DIAGNOSIS — I2510 Atherosclerotic heart disease of native coronary artery without angina pectoris: Secondary | ICD-10-CM | POA: Insufficient documentation

## 2017-04-14 DIAGNOSIS — I252 Old myocardial infarction: Secondary | ICD-10-CM | POA: Insufficient documentation

## 2017-04-14 DIAGNOSIS — Z7982 Long term (current) use of aspirin: Secondary | ICD-10-CM | POA: Diagnosis not present

## 2017-04-14 DIAGNOSIS — I428 Other cardiomyopathies: Secondary | ICD-10-CM | POA: Insufficient documentation

## 2017-04-14 DIAGNOSIS — Z87891 Personal history of nicotine dependence: Secondary | ICD-10-CM | POA: Diagnosis not present

## 2017-04-14 DIAGNOSIS — I110 Hypertensive heart disease with heart failure: Secondary | ICD-10-CM | POA: Insufficient documentation

## 2017-04-14 DIAGNOSIS — I471 Supraventricular tachycardia: Secondary | ICD-10-CM | POA: Diagnosis not present

## 2017-04-14 DIAGNOSIS — Z7984 Long term (current) use of oral hypoglycemic drugs: Secondary | ICD-10-CM | POA: Insufficient documentation

## 2017-04-14 DIAGNOSIS — I4729 Other ventricular tachycardia: Secondary | ICD-10-CM

## 2017-04-14 DIAGNOSIS — Z95810 Presence of automatic (implantable) cardiac defibrillator: Secondary | ICD-10-CM

## 2017-04-14 HISTORY — PX: ICD IMPLANT: EP1208

## 2017-04-14 LAB — GLUCOSE, CAPILLARY
GLUCOSE-CAPILLARY: 147 mg/dL — AB (ref 65–99)
Glucose-Capillary: 138 mg/dL — ABNORMAL HIGH (ref 65–99)
Glucose-Capillary: 123 mg/dL — ABNORMAL HIGH (ref 65–99)

## 2017-04-14 LAB — SURGICAL PCR SCREEN
MRSA, PCR: NEGATIVE
Staphylococcus aureus: NEGATIVE

## 2017-04-14 SURGERY — ICD IMPLANT
Anesthesia: LOCAL

## 2017-04-14 MED ORDER — MUPIROCIN 2 % EX OINT
1.0000 "application " | TOPICAL_OINTMENT | Freq: Once | CUTANEOUS | Status: AC
  Administered 2017-04-14: 1 via TOPICAL

## 2017-04-14 MED ORDER — METOPROLOL TARTRATE 12.5 MG HALF TABLET
12.5000 mg | ORAL_TABLET | Freq: Two times a day (BID) | ORAL | Status: DC
  Administered 2017-04-14 – 2017-04-15 (×2): 12.5 mg via ORAL
  Filled 2017-04-14 (×2): qty 1

## 2017-04-14 MED ORDER — MUPIROCIN 2 % EX OINT
TOPICAL_OINTMENT | CUTANEOUS | Status: AC
  Administered 2017-04-14: 1 via TOPICAL
  Filled 2017-04-14: qty 22

## 2017-04-14 MED ORDER — MIDAZOLAM HCL 5 MG/5ML IJ SOLN
INTRAMUSCULAR | Status: AC
  Filled 2017-04-14: qty 5

## 2017-04-14 MED ORDER — FENTANYL CITRATE (PF) 100 MCG/2ML IJ SOLN
INTRAMUSCULAR | Status: AC
  Filled 2017-04-14: qty 2

## 2017-04-14 MED ORDER — CHLORHEXIDINE GLUCONATE 4 % EX LIQD: 60.0000 mL | Freq: Once | CUTANEOUS | Status: DC

## 2017-04-14 MED ORDER — HYDRALAZINE HCL 25 MG PO TABS
25.0000 mg | ORAL_TABLET | Freq: Three times a day (TID) | ORAL | Status: DC
  Administered 2017-04-14: 25 mg via ORAL
  Filled 2017-04-14: qty 1

## 2017-04-14 MED ORDER — CEFAZOLIN SODIUM-DEXTROSE 1-4 GM/50ML-% IV SOLN
1.0000 g | Freq: Three times a day (TID) | INTRAVENOUS | Status: AC
  Administered 2017-04-14 – 2017-04-15 (×3): 1 g via INTRAVENOUS
  Filled 2017-04-14 (×4): qty 50

## 2017-04-14 MED ORDER — GENTAMICIN SULFATE 40 MG/ML IJ SOLN
80.0000 mg | INTRAMUSCULAR | Status: AC
  Administered 2017-04-14: 80 mg

## 2017-04-14 MED ORDER — HEPARIN (PORCINE) IN NACL 2-0.9 UNIT/ML-% IJ SOLN
INTRAMUSCULAR | Status: AC
  Filled 2017-04-14: qty 500

## 2017-04-14 MED ORDER — CEFAZOLIN SODIUM-DEXTROSE 2-4 GM/100ML-% IV SOLN
2.0000 g | INTRAVENOUS | Status: AC
  Administered 2017-04-14: 2 g via INTRAVENOUS

## 2017-04-14 MED ORDER — HEPARIN (PORCINE) IN NACL 2-0.9 UNIT/ML-% IJ SOLN
INTRAMUSCULAR | Status: AC | PRN
  Administered 2017-04-14: 500 mL

## 2017-04-14 MED ORDER — ACETAMINOPHEN 325 MG PO TABS: 325.0000 mg | ORAL_TABLET | ORAL | Status: DC | PRN

## 2017-04-14 MED ORDER — LIDOCAINE HCL 1 % IJ SOLN
INTRAMUSCULAR | Status: AC
  Filled 2017-04-14: qty 20

## 2017-04-14 MED ORDER — LOSARTAN POTASSIUM 50 MG PO TABS
100.0000 mg | ORAL_TABLET | Freq: Every day | ORAL | Status: DC
Start: 2017-04-14 — End: 2017-04-15
  Administered 2017-04-15: 100 mg via ORAL
  Filled 2017-04-14: qty 2

## 2017-04-14 MED ORDER — FENTANYL CITRATE (PF) 100 MCG/2ML IJ SOLN
INTRAMUSCULAR | Status: DC | PRN
  Administered 2017-04-14 (×5): 12.5 ug via INTRAVENOUS
  Administered 2017-04-14: 25 ug via INTRAVENOUS
  Administered 2017-04-14: 12.5 ug via INTRAVENOUS

## 2017-04-14 MED ORDER — GLIMEPIRIDE 4 MG PO TABS
2.0000 mg | ORAL_TABLET | Freq: Every day | ORAL | Status: DC
  Administered 2017-04-15: 2 mg via ORAL
  Filled 2017-04-14: qty 1

## 2017-04-14 MED ORDER — FUROSEMIDE 40 MG PO TABS
20.0000 mg | ORAL_TABLET | Freq: Every day | ORAL | Status: DC
  Administered 2017-04-15: 20 mg via ORAL
  Filled 2017-04-14: qty 1

## 2017-04-14 MED ORDER — ASPIRIN EC 81 MG PO TBEC
81.0000 mg | DELAYED_RELEASE_TABLET | Freq: Every day | ORAL | Status: DC
  Administered 2017-04-15: 81 mg via ORAL
  Filled 2017-04-14: qty 1

## 2017-04-14 MED ORDER — ATORVASTATIN CALCIUM 40 MG PO TABS: 40.0000 mg | ORAL_TABLET | Freq: Every day | ORAL | Status: DC

## 2017-04-14 MED ORDER — ONDANSETRON HCL 4 MG/2ML IJ SOLN: 4.0000 mg | Freq: Four times a day (QID) | INTRAMUSCULAR | Status: DC | PRN

## 2017-04-14 MED ORDER — DIPHENHYDRAMINE HCL 50 MG/ML IJ SOLN
25.0000 mg | Freq: Once | INTRAMUSCULAR | Status: AC
Start: 2017-04-14 — End: 2017-04-14
  Administered 2017-04-14: 25 mg via INTRAVENOUS
  Filled 2017-04-14: qty 1

## 2017-04-14 MED ORDER — NITROGLYCERIN 0.4 MG SL SUBL: 0.4000 mg | SUBLINGUAL_TABLET | SUBLINGUAL | Status: DC | PRN

## 2017-04-14 MED ORDER — MIDAZOLAM HCL 5 MG/5ML IJ SOLN
INTRAMUSCULAR | Status: DC | PRN
  Administered 2017-04-14 (×7): 1 mg via INTRAVENOUS
  Administered 2017-04-14: 2 mg via INTRAVENOUS

## 2017-04-14 MED ORDER — GENTAMICIN SULFATE 40 MG/ML IJ SOLN
INTRAMUSCULAR | Status: AC
  Filled 2017-04-14: qty 2

## 2017-04-14 MED ORDER — LIDOCAINE HCL (PF) 1 % IJ SOLN
INTRAMUSCULAR | Status: DC | PRN
  Administered 2017-04-14: 60 mL

## 2017-04-14 MED ORDER — SODIUM CHLORIDE 0.9 % IV SOLN
INTRAVENOUS | Status: DC
  Administered 2017-04-14: 11:00:00 via INTRAVENOUS

## 2017-04-14 MED ORDER — DIPHENHYDRAMINE HCL 50 MG/ML IJ SOLN
INTRAMUSCULAR | Status: AC
  Filled 2017-04-14: qty 1

## 2017-04-14 MED ORDER — CEFAZOLIN SODIUM-DEXTROSE 2-4 GM/100ML-% IV SOLN
INTRAVENOUS | Status: AC
  Filled 2017-04-14: qty 100

## 2017-04-14 MED ORDER — AMIODARONE HCL 100 MG PO TABS
100.0000 mg | ORAL_TABLET | Freq: Every day | ORAL | Status: DC
  Administered 2017-04-15: 100 mg via ORAL
  Filled 2017-04-14: qty 1

## 2017-04-14 SURGICAL SUPPLY — 9 items
CABLE SURGICAL S-101-97-12 (CABLE) ×1 IMPLANT
ICD ELLIPSE DR CD2411-36C (ICD Generator) ×1 IMPLANT
LEAD DURATA 7122-65CM (Lead) ×1 IMPLANT
LEAD TENDRIL MRI 52CM LPA1200M (Lead) ×1 IMPLANT
PAD DEFIB LIFELINK (PAD) ×1 IMPLANT
SHEATH CLASSIC 7F (SHEATH) ×1 IMPLANT
SHEATH CLASSIC 8F (SHEATH) ×1 IMPLANT
SHEATH CLASSIC 9F (SHEATH) ×1 IMPLANT
TRAY PACEMAKER INSERTION (PACKS) ×1 IMPLANT

## 2017-04-14 NOTE — Interval H&P Note (Signed)
History and Physical Interval Note:  04/14/2017 11:03 AM  Luis Donaldson  has presented today for surgery, with the diagnosis of chronic systolic hf  The various methods of treatment have been discussed with the patient and family. After consideration of risks, benefits and other options for treatment, the patient has consented to  Procedure(s): ICD IMPLANT (N/A) as a surgical intervention .  The patient's history has been reviewed, patient examined, no change in status, stable for surgery.  I have reviewed the patient's chart and labs.  Questions were answered to the patient's satisfaction.     [name]

## 2017-04-14 NOTE — Interval H&P Note (Signed)
History and Physical Interval Note:  04/14/2017 11:52 AM  Luis Donaldson  has presented today for surgery, with the diagnosis of chronic systolic hf  The various methods of treatment have been discussed with the patient and family. After consideration of risks, benefits and other options for treatment, the patient has consented to  Procedure(s): ICD IMPLANT (N/A) as a surgical intervention .  The patient's history has been reviewed, patient examined, no change in status, stable for surgery.  I have reviewed the patient's chart and labs.  Questions were answered to the patient's satisfaction.     Luis Donaldson

## 2017-04-14 NOTE — Discharge Summary (Addendum)
ELECTROPHYSIOLOGY PROCEDURE DISCHARGE SUMMARY    Patient ID: Luis Donaldson,  MRN: [id number], DOB/AGE: Apr 12, 1945 72 y.o.  Admit date: 04/14/2017 Discharge date: 04/15/17  Primary Care Physician: Ladell Pier, MD  Primary Cardiologist: Dr. Martinique Electrophysiologist: Dr. Lovena Le  Primary Discharge Diagnosis:  1. ICM  Secondary Discharge Diagnosis:  1. CAD 2. Chronic CHF (systolic) 3. HTN  No Known Allergies   Procedures This Admission:  1.  Implantation of a SJM dual chamber ICD on 04/14/17 by Dr Lovena Le.  The patient received a [employer]. Jude (serial Number [id number]) ICD, [employer].Jude (serial # [id number] ) right atrial lead and a St. Jude (serial number [id number]) right ventricular defibrillator lead  DFT's were successful at 20 J.   There were no immediate post procedure complications. 2.  CXR on did not describe a pneumothorax status post device implantation.   Brief HPI: SIRIS HOOS is a 72 y.o. male was referred to electrophysiology in the outpatient setting for consideration of ICD implantation.  Past medical history is noted above.  The patient has persistent LV dysfunction despite guideline directed therapy.  Risks, benefits, and alternatives to ICD implantation were reviewed with the patient who wished to proceed.   Hospital Course:  The patient was admitted and underwent implantation of an ICD with details as outlined above. He was monitored on telemetry overnight which demonstrated SR 50's.  Left chest was without hematoma or ecchymosis.  The device was interrogated and found to be functioning normally.  CXR was obtained and demonstrated no pneumothorax status post device implantation.  Patient reports intermittent hives and generalized itching since being started on Hydralazine, we will stop this, at his wound check visit plan for BP check as well. Will increase his metoprolol some, though if he starts to RV pace significantly will need to use another agent, for now will  avoid any new drugs.  Wound care, arm mobility, and restrictions were reviewed with the patient.  The patient was examined by Dr. Lovena Le and considered stable for discharge to home.   The patient's discharge medications include an ACE-I (losartan) and beta blocker (metoprolol).   Physical Exam: Vitals:   04/14/17 2315 04/15/17 0030 04/15/17 0602 04/15/17 0828  BP: (!) 149/59 (!) 147/58 (!) 159/59 (!) 142/57  Pulse: (!) 56 (!) 55 (!) 58 61  Resp:   16 18  Temp:   97.6 F (36.4 C) 98 F (36.7 C)  TempSrc:   Oral Oral  SpO2:   96% 95%  Weight:   204 lb (92.5 kg)   Height:        GEN- The patient is well appearing, alert and oriented x 3 today.   HEENT: normocephalic, atraumatic; sclera clear, conjunctiva pink; hearing intact; oropharynx clear Lungs- CTA b/l, normal work of breathing.  No wheezes, rales, rhonchi Heart- RRR, no murmurs, rubs or gallops, PMI not laterally displaced GI- soft, non-tender, non-distended Extremities- no clubbing, cyanosis, or edema; MS- no significant deformity or atrophy Skin- warm and dry, no appreciable rash or lesions noted this morning, left chest without hematoma/ecchymosis Psych- euthymic mood, full affect Neuro- no gross defecits  Labs:   Lab Results  Component Value Date   WBC 8.4 04/07/2017   HGB 12.7 (L) 04/07/2017   HCT 37.0 (L) 04/07/2017   MCV 93 04/07/2017   PLT 201 04/07/2017   No results for input(s): NA, K, CL, CO2, BUN, CREATININE, CALCIUM, PROT, BILITOT, ALKPHOS, ALT, AST, GLUCOSE in the last 168 hours.  Invalid input(s):  LABALBU  Discharge Medications:  Allergies as of 04/15/2017   No Known Allergies     Medication List    STOP taking these medications   hydrALAZINE 25 MG tablet Commonly known as:  APRESOLINE     TAKE these medications   amiodarone 200 MG tablet Commonly known as:  PACERONE Take 100 mg by mouth daily. Take 1/2 tablet ( 100 mg ) daily   aspirin 81 MG EC tablet Take 1 tablet (81 mg total) by mouth  daily.   atorvastatin 80 MG tablet Commonly known as:  LIPITOR Take 0.5 tablets (40 mg total) by mouth daily at 6 PM.   furosemide 40 MG tablet Commonly known as:  LASIX Take 1/2 tablet ( 20 mg ) daily   glimepiride 2 MG tablet Commonly known as:  AMARYL Take 1 tablet (2 mg total) by mouth daily before breakfast.   glucose blood test strip Commonly known as:  TRUE METRIX BLOOD GLUCOSE TEST Use as instructed   losartan 100 MG tablet Commonly known as:  COZAAR Take 1 tablet (100 mg total) by mouth daily.   metoprolol tartrate 25 MG tablet Commonly known as:  LOPRESSOR Take 1 tablet (25 mg total) by mouth 2 (two) times daily. What changed:    how much to take  additional instructions   nitroGLYCERIN 0.4 MG SL tablet Commonly known as:  NITROSTAT Place 1 tablet (0.4 mg total) under the tongue every 5 (five) minutes x 3 doses as needed for chest pain.   TRUE METRIX METER w/Device Kit Use as directed   TRUEPLUS LANCETS 28G Misc Use as directed       Disposition: Home  Discharge Instructions    Diet - low sodium heart healthy   Complete by:  As directed    Increase activity slowly   Complete by:  As directed      Follow-up Information    [place] [facility] Follow up on 04/24/2017.   Specialty:  Cardiology Why:  11:30AM, wound check visit Contact information: [address], [address] [place]       Evans Lance, MD Follow up on 07/18/2017.   Specialty:  Cardiology Why:  11:30AM Contact information: [address] [address] [phone]           Duration of Discharge Encounter: Greater than 30 minutes including physician time.  Venetia Night, PA-C 04/15/2017 9:56 AM  EP Attending  Patient seen and examined. Agree with above. He is doing well after DDD ICD insertion for primary prevention. His device has been interogated under my direct supervision and  is working normally. Usual followup.  Mikle Bosworth.D.

## 2017-04-14 NOTE — Discharge Instructions (Signed)
° ° °  Supplemental Discharge Instructions for  °Pacemaker/Defibrillator Patients ° °Activity °No heavy lifting or vigorous activity with your left/right arm for 6 to 8 weeks.  Do not raise your left/right arm above your head for one week.  Gradually raise your affected arm as drawn below. ° °        °    04/18/17                     04/19/17                    04/20/17                  04/21/17 °__ ° °NO DRIVING for  1 week   ; you may begin driving on   04/21/17  . ° °WOUND CARE °- Keep the wound area clean and dry.  Do not get this area wet for one week. No showers for one week; you may shower on 04/21/17  . °- The tape/steri-strips on your wound will fall off; do not pull them off.  No bandage is needed on the site.  DO  NOT apply any creams, oils, or ointments to the wound area. °- If you notice any drainage or discharge from the wound, any swelling or bruising at the site, or you develop a fever > 101? F after you are discharged home, call the office at once. ° °Special Instructions °- You are still able to use cellular telephones; use the ear opposite the side where you have your pacemaker/defibrillator.  Avoid carrying your cellular phone near your device. °- When traveling through airports, show security personnel your identification card to avoid being screened in the metal detectors.  Ask the security personnel to use the hand wand. °- Avoid arc welding equipment, MRI testing (magnetic resonance imaging), TENS units (transcutaneous nerve stimulators).  Call the office for questions about other devices. °- Avoid electrical appliances that are in poor condition or are not properly grounded. °- Microwave ovens are safe to be near or to operate. ° °Additional information for defibrillator patients should your device go off: °- If your device goes off ONCE and you feel fine afterward, notify the device clinic nurses. °- If your device goes off ONCE and you do not feel well afterward, call 911. °- If your device goes off  TWICE, call 911. °- If your device goes off THREE times in one day, call 911. ° °DO NOT DRIVE YOURSELF OR A FAMILY MEMBER °WITH A DEFIBRILLATOR TO THE HOSPITAL--CALL 911. ° °

## 2017-04-14 NOTE — Interval H&P Note (Signed)
History and Physical Interval Note:  04/14/2017 11:04 AM  Luis Donaldson  has presented today for surgery, with the diagnosis of chronic systolic hf  The various methods of treatment have been discussed with the patient and family. After consideration of risks, benefits and other options for treatment, the patient has consented to  Procedure(s): ICD IMPLANT (N/A) as a surgical intervention .  The patient's history has been reviewed, patient examined, no change in status, stable for surgery.  I have reviewed the patient's chart and labs.  Questions were answered to the patient's satisfaction.     [name]

## 2017-04-14 NOTE — Progress Notes (Signed)
C/O itching. Patient states he has had a rash since about March 2. Raised, pink,  very small welts noted rt side. Dr. Lovena Le made aware.

## 2017-04-15 ENCOUNTER — Encounter (HOSPITAL_COMMUNITY): Payer: Self-pay | Admitting: General Practice

## 2017-04-15 ENCOUNTER — Ambulatory Visit (HOSPITAL_COMMUNITY): Payer: Medicare Other

## 2017-04-15 DIAGNOSIS — I428 Other cardiomyopathies: Secondary | ICD-10-CM | POA: Diagnosis not present

## 2017-04-15 DIAGNOSIS — Z7982 Long term (current) use of aspirin: Secondary | ICD-10-CM | POA: Diagnosis not present

## 2017-04-15 DIAGNOSIS — I2510 Atherosclerotic heart disease of native coronary artery without angina pectoris: Secondary | ICD-10-CM | POA: Diagnosis not present

## 2017-04-15 DIAGNOSIS — E785 Hyperlipidemia, unspecified: Secondary | ICD-10-CM | POA: Diagnosis not present

## 2017-04-15 DIAGNOSIS — I471 Supraventricular tachycardia: Secondary | ICD-10-CM | POA: Diagnosis not present

## 2017-04-15 DIAGNOSIS — I5022 Chronic systolic (congestive) heart failure: Secondary | ICD-10-CM | POA: Diagnosis not present

## 2017-04-15 DIAGNOSIS — I110 Hypertensive heart disease with heart failure: Secondary | ICD-10-CM | POA: Diagnosis not present

## 2017-04-15 DIAGNOSIS — I255 Ischemic cardiomyopathy: Secondary | ICD-10-CM

## 2017-04-15 DIAGNOSIS — Z87891 Personal history of nicotine dependence: Secondary | ICD-10-CM | POA: Diagnosis not present

## 2017-04-15 DIAGNOSIS — Z79899 Other long term (current) drug therapy: Secondary | ICD-10-CM | POA: Diagnosis not present

## 2017-04-15 DIAGNOSIS — I517 Cardiomegaly: Secondary | ICD-10-CM | POA: Diagnosis not present

## 2017-04-15 DIAGNOSIS — I252 Old myocardial infarction: Secondary | ICD-10-CM | POA: Diagnosis not present

## 2017-04-15 DIAGNOSIS — Z006 Encounter for examination for normal comparison and control in clinical research program: Secondary | ICD-10-CM | POA: Diagnosis not present

## 2017-04-15 DIAGNOSIS — Z951 Presence of aortocoronary bypass graft: Secondary | ICD-10-CM | POA: Diagnosis not present

## 2017-04-15 LAB — GLUCOSE, CAPILLARY: GLUCOSE-CAPILLARY: 150 mg/dL — AB (ref 65–99)

## 2017-04-15 MED ORDER — METOPROLOL TARTRATE 25 MG PO TABS: 25.0000 mg | ORAL_TABLET | Freq: Two times a day (BID) | ORAL | 3 refills | Status: DC

## 2017-04-15 MED FILL — Lidocaine HCl Local Inj 1%: INTRAMUSCULAR | Qty: 60 | Status: AC

## 2017-04-15 NOTE — Progress Notes (Signed)
Pt discharge instructions reviewed with pt. Pt verbalizes understanding. Pt states he has no questions. Pt belongings with pt. Pt is not in distress. Pt discharged via wheelchair. Pt's friend Luis Donaldson) is driving him home.

## 2017-04-16 ENCOUNTER — Telehealth: Payer: Self-pay | Admitting: Internal Medicine

## 2017-04-16 DIAGNOSIS — E119 Type 2 diabetes mellitus without complications: Secondary | ICD-10-CM | POA: Diagnosis not present

## 2017-04-16 DIAGNOSIS — I2510 Atherosclerotic heart disease of native coronary artery without angina pectoris: Secondary | ICD-10-CM | POA: Diagnosis not present

## 2017-04-16 DIAGNOSIS — I252 Old myocardial infarction: Secondary | ICD-10-CM | POA: Diagnosis not present

## 2017-04-16 DIAGNOSIS — I5021 Acute systolic (congestive) heart failure: Secondary | ICD-10-CM | POA: Diagnosis not present

## 2017-04-16 DIAGNOSIS — I110 Hypertensive heart disease with heart failure: Secondary | ICD-10-CM | POA: Diagnosis not present

## 2017-04-16 DIAGNOSIS — I255 Ischemic cardiomyopathy: Secondary | ICD-10-CM | POA: Diagnosis not present

## 2017-04-16 NOTE — Telephone Encounter (Signed)
°  1. Has your device fired? no 2. Is you device beeping? no 3. Are you experiencing draining or swelling at device site? yes 4. Are you calling to see if we received your device transmission? no 5. Have you passed out? no    1) Please route to [place]

## 2017-04-16 NOTE — Telephone Encounter (Signed)
OK to extend PT one week.  Peter Martinique MD, [facility]

## 2017-04-16 NOTE — Telephone Encounter (Signed)
Mr. Luis Donaldson calling about swelling after ICD implant 04/14/17. Mr. Luis Donaldson can't remember if it is any larger than at discharge. When asked to compare the size of device site swelling to stacked silver dollars, tennis ball, softball he says it looks to be the size of 2 silver dollars stacked on top of one another under the skin. I advised that it is likely just the ICD he is feeling but to monitor the swelling overnight and check in the morning. If it looks larger than to call us and we can evaluate him in the office. 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

## 2017-04-16 NOTE — Telephone Encounter (Signed)
New message  Luis Donaldson is calling to get orders for another week of home health physical therapy. Please call

## 2017-04-17 DIAGNOSIS — I2510 Atherosclerotic heart disease of native coronary artery without angina pectoris: Secondary | ICD-10-CM | POA: Diagnosis not present

## 2017-04-17 DIAGNOSIS — I252 Old myocardial infarction: Secondary | ICD-10-CM | POA: Diagnosis not present

## 2017-04-17 DIAGNOSIS — I5021 Acute systolic (congestive) heart failure: Secondary | ICD-10-CM | POA: Diagnosis not present

## 2017-04-17 DIAGNOSIS — I255 Ischemic cardiomyopathy: Secondary | ICD-10-CM | POA: Diagnosis not present

## 2017-04-17 DIAGNOSIS — E119 Type 2 diabetes mellitus without complications: Secondary | ICD-10-CM | POA: Diagnosis not present

## 2017-04-17 DIAGNOSIS — I110 Hypertensive heart disease with heart failure: Secondary | ICD-10-CM | POA: Diagnosis not present

## 2017-04-18 NOTE — Telephone Encounter (Signed)
Returned call to [facility]. Dr.Jordan advised ok to extend PT for another week.

## 2017-04-21 ENCOUNTER — Telehealth: Payer: Self-pay | Admitting: Cardiology

## 2017-04-21 NOTE — Telephone Encounter (Signed)
New Message    [name] with [facility] is calling to see if Dr. Martinique will sign off on additional on health visit. Please call to discuss.

## 2017-04-21 NOTE — Telephone Encounter (Signed)
Returned call to [name] with [place].Left message on personal voice mail Dr.Jordan will sign additional health visit.

## 2017-04-21 NOTE — Telephone Encounter (Signed)
Yes OK by me  [place] Duerst Martinique MD, [facility]

## 2017-04-21 NOTE — Telephone Encounter (Signed)
Will send to Dr  Martinique for review .Luis Donaldson

## 2017-04-22 ENCOUNTER — Telehealth: Payer: Self-pay | Admitting: Cardiology

## 2017-04-22 DIAGNOSIS — I5021 Acute systolic (congestive) heart failure: Secondary | ICD-10-CM | POA: Diagnosis not present

## 2017-04-22 DIAGNOSIS — I252 Old myocardial infarction: Secondary | ICD-10-CM | POA: Diagnosis not present

## 2017-04-22 DIAGNOSIS — E119 Type 2 diabetes mellitus without complications: Secondary | ICD-10-CM | POA: Diagnosis not present

## 2017-04-22 DIAGNOSIS — I255 Ischemic cardiomyopathy: Secondary | ICD-10-CM | POA: Diagnosis not present

## 2017-04-22 DIAGNOSIS — I2510 Atherosclerotic heart disease of native coronary artery without angina pectoris: Secondary | ICD-10-CM | POA: Diagnosis not present

## 2017-04-22 DIAGNOSIS — I110 Hypertensive heart disease with heart failure: Secondary | ICD-10-CM | POA: Diagnosis not present

## 2017-04-22 NOTE — Telephone Encounter (Signed)
OK  Lorry Anastasi MD, FACC   

## 2017-04-22 NOTE — Telephone Encounter (Signed)
Luis Donaldson is requesting an extension on home health physical therapy for one time a week for three weeks. Message routed to the provider.

## 2017-04-22 NOTE — Telephone Encounter (Signed)
Returned call to Shaunda with [place] advised ok.

## 2017-04-22 NOTE — Telephone Encounter (Signed)
New message    Needs approval for home health physical therapy 1x for 3 weeks

## 2017-04-23 DIAGNOSIS — I110 Hypertensive heart disease with heart failure: Secondary | ICD-10-CM | POA: Diagnosis not present

## 2017-04-23 DIAGNOSIS — I252 Old myocardial infarction: Secondary | ICD-10-CM | POA: Diagnosis not present

## 2017-04-23 DIAGNOSIS — I2510 Atherosclerotic heart disease of native coronary artery without angina pectoris: Secondary | ICD-10-CM | POA: Diagnosis not present

## 2017-04-23 DIAGNOSIS — I255 Ischemic cardiomyopathy: Secondary | ICD-10-CM | POA: Diagnosis not present

## 2017-04-23 DIAGNOSIS — E119 Type 2 diabetes mellitus without complications: Secondary | ICD-10-CM | POA: Diagnosis not present

## 2017-04-23 DIAGNOSIS — I5021 Acute systolic (congestive) heart failure: Secondary | ICD-10-CM | POA: Diagnosis not present

## 2017-04-24 ENCOUNTER — Ambulatory Visit (INDEPENDENT_AMBULATORY_CARE_PROVIDER_SITE_OTHER): Payer: Medicare Other | Admitting: *Deleted

## 2017-04-24 DIAGNOSIS — I255 Ischemic cardiomyopathy: Secondary | ICD-10-CM | POA: Diagnosis not present

## 2017-04-24 DIAGNOSIS — I5022 Chronic systolic (congestive) heart failure: Secondary | ICD-10-CM | POA: Diagnosis not present

## 2017-04-24 LAB — CUP PACEART INCLINIC DEVICE CHECK
Brady Statistic RV Percent Paced: 0.11 %
Date Time Interrogation Session: 20190404132504
HIGH POWER IMPEDANCE MEASURED VALUE: 47.25 Ohm
Implantable Lead Implant Date: 20190325
Implantable Lead Location: 753859
Implantable Lead Location: 753860
Implantable Lead Model: 7122
Implantable Pulse Generator Implant Date: 20190325
Lead Channel Pacing Threshold Amplitude: 0.75 V
Lead Channel Pacing Threshold Pulse Width: 0.5 ms
Lead Channel Pacing Threshold Pulse Width: 0.5 ms
Lead Channel Sensing Intrinsic Amplitude: 1.5 mV
Lead Channel Sensing Intrinsic Amplitude: 12 mV
Lead Channel Setting Pacing Amplitude: 3.5 V
Lead Channel Setting Pacing Amplitude: 3.5 V
Lead Channel Setting Pacing Pulse Width: 0.5 ms
MDC IDC LEAD IMPLANT DT: 20190325
MDC IDC MSMT BATTERY REMAINING LONGEVITY: 92 mo
MDC IDC MSMT LEADCHNL RA IMPEDANCE VALUE: 475 Ohm
MDC IDC MSMT LEADCHNL RA PACING THRESHOLD AMPLITUDE: 0.75 V
MDC IDC MSMT LEADCHNL RA PACING THRESHOLD AMPLITUDE: 0.75 V
MDC IDC MSMT LEADCHNL RA PACING THRESHOLD PULSEWIDTH: 0.5 ms
MDC IDC MSMT LEADCHNL RV IMPEDANCE VALUE: 487.5 Ohm
MDC IDC MSMT LEADCHNL RV PACING THRESHOLD AMPLITUDE: 0.75 V
MDC IDC MSMT LEADCHNL RV PACING THRESHOLD PULSEWIDTH: 0.5 ms
MDC IDC SET LEADCHNL RV SENSING SENSITIVITY: 0.5 mV
MDC IDC STAT BRADY RA PERCENT PACED: 18 %
Pulse Gen Serial Number: 7387147

## 2017-04-24 NOTE — Progress Notes (Signed)
Wound check appointment. Steri-strips removed. Wound without redness or edema. Incision edges approximated, wound well healed. Normal device function. Thresholds, sensing, and impedances consistent with implant measurements. Device programmed at 3.5V for extra safety margin until 3 month visit. Histogram distribution appropriate for patient and level of activity. No mode switches or ventricular arrhythmias noted. Patient educated about wound care, arm mobility, lifting restrictions, shock plan. ROV with GT 6/28

## 2017-04-25 ENCOUNTER — Telehealth (HOSPITAL_COMMUNITY): Payer: Self-pay | Admitting: Vascular Surgery

## 2017-04-25 DIAGNOSIS — Z720 Tobacco use: Secondary | ICD-10-CM | POA: Diagnosis not present

## 2017-04-25 DIAGNOSIS — I252 Old myocardial infarction: Secondary | ICD-10-CM | POA: Diagnosis not present

## 2017-04-25 DIAGNOSIS — J9690 Respiratory failure, unspecified, unspecified whether with hypoxia or hypercapnia: Secondary | ICD-10-CM | POA: Diagnosis not present

## 2017-04-25 DIAGNOSIS — E119 Type 2 diabetes mellitus without complications: Secondary | ICD-10-CM | POA: Diagnosis not present

## 2017-04-25 DIAGNOSIS — I110 Hypertensive heart disease with heart failure: Secondary | ICD-10-CM | POA: Diagnosis not present

## 2017-04-25 DIAGNOSIS — Z95810 Presence of automatic (implantable) cardiac defibrillator: Secondary | ICD-10-CM | POA: Diagnosis not present

## 2017-04-25 DIAGNOSIS — I472 Ventricular tachycardia: Secondary | ICD-10-CM | POA: Diagnosis not present

## 2017-04-25 DIAGNOSIS — I2510 Atherosclerotic heart disease of native coronary artery without angina pectoris: Secondary | ICD-10-CM | POA: Diagnosis not present

## 2017-04-25 DIAGNOSIS — E785 Hyperlipidemia, unspecified: Secondary | ICD-10-CM | POA: Diagnosis not present

## 2017-04-25 DIAGNOSIS — Z7982 Long term (current) use of aspirin: Secondary | ICD-10-CM | POA: Diagnosis not present

## 2017-04-25 DIAGNOSIS — Z951 Presence of aortocoronary bypass graft: Secondary | ICD-10-CM | POA: Diagnosis not present

## 2017-04-25 DIAGNOSIS — I255 Ischemic cardiomyopathy: Secondary | ICD-10-CM | POA: Diagnosis not present

## 2017-04-25 DIAGNOSIS — I5021 Acute systolic (congestive) heart failure: Secondary | ICD-10-CM | POA: Diagnosis not present

## 2017-04-25 NOTE — Telephone Encounter (Signed)
Left 3rd message to get pt appt w/ heart failure clinic

## 2017-04-29 ENCOUNTER — Telehealth: Payer: Self-pay

## 2017-04-29 DIAGNOSIS — I255 Ischemic cardiomyopathy: Secondary | ICD-10-CM | POA: Diagnosis not present

## 2017-04-29 DIAGNOSIS — I110 Hypertensive heart disease with heart failure: Secondary | ICD-10-CM | POA: Diagnosis not present

## 2017-04-29 DIAGNOSIS — E119 Type 2 diabetes mellitus without complications: Secondary | ICD-10-CM | POA: Diagnosis not present

## 2017-04-29 DIAGNOSIS — I5021 Acute systolic (congestive) heart failure: Secondary | ICD-10-CM | POA: Diagnosis not present

## 2017-04-29 DIAGNOSIS — I2510 Atherosclerotic heart disease of native coronary artery without angina pectoris: Secondary | ICD-10-CM | POA: Diagnosis not present

## 2017-04-29 DIAGNOSIS — I252 Old myocardial infarction: Secondary | ICD-10-CM | POA: Diagnosis not present

## 2017-04-29 NOTE — Telephone Encounter (Signed)
Spoke to patient about message I received from Lindsay at heart failure clinic.She has left several messages for you to schedule appointment and has not heard back from you.Patient got upset.Stated he just had a ICD and had too many bills at this time.He said to call him back in 6 months.He does not want to schedule heart failure clinic appointment at this time.

## 2017-04-30 DIAGNOSIS — I255 Ischemic cardiomyopathy: Secondary | ICD-10-CM | POA: Diagnosis not present

## 2017-04-30 DIAGNOSIS — I5021 Acute systolic (congestive) heart failure: Secondary | ICD-10-CM | POA: Diagnosis not present

## 2017-04-30 DIAGNOSIS — E119 Type 2 diabetes mellitus without complications: Secondary | ICD-10-CM | POA: Diagnosis not present

## 2017-04-30 DIAGNOSIS — I110 Hypertensive heart disease with heart failure: Secondary | ICD-10-CM | POA: Diagnosis not present

## 2017-04-30 DIAGNOSIS — I252 Old myocardial infarction: Secondary | ICD-10-CM | POA: Diagnosis not present

## 2017-04-30 DIAGNOSIS — I2510 Atherosclerotic heart disease of native coronary artery without angina pectoris: Secondary | ICD-10-CM | POA: Diagnosis not present

## 2017-05-01 ENCOUNTER — Encounter: Payer: Self-pay | Admitting: Cardiology

## 2017-05-01 DIAGNOSIS — I5021 Acute systolic (congestive) heart failure: Secondary | ICD-10-CM | POA: Diagnosis not present

## 2017-05-01 DIAGNOSIS — I2510 Atherosclerotic heart disease of native coronary artery without angina pectoris: Secondary | ICD-10-CM | POA: Diagnosis not present

## 2017-05-01 DIAGNOSIS — E119 Type 2 diabetes mellitus without complications: Secondary | ICD-10-CM | POA: Diagnosis not present

## 2017-05-01 DIAGNOSIS — I255 Ischemic cardiomyopathy: Secondary | ICD-10-CM | POA: Diagnosis not present

## 2017-05-01 DIAGNOSIS — I252 Old myocardial infarction: Secondary | ICD-10-CM | POA: Diagnosis not present

## 2017-05-01 DIAGNOSIS — I110 Hypertensive heart disease with heart failure: Secondary | ICD-10-CM | POA: Diagnosis not present

## 2017-05-05 NOTE — Progress Notes (Signed)
05/07/2017 Luis Donaldson   [birth year]  [id number]  Primary Physician Ladell Pier, MD Primary Cardiologist: Dr Martinique  HPI:  72 y.o. male is seen for follow up CAD and CHF. He has a PMH of remote PCI in the 90's at [facility] then CABG in 2000 at [facility]. He was not seeing any provider and had stopped all his medication for at least a year prior to admission this past fall. His explanation was a combination of doing well, cost, and side effects.   He presented to [facility] 12/16/16 with chest pain, respiratory failure, hypertensive emergency, persistent runs of NSVT, and what was thought to be STEMI with ST elevation in V1-V2.  He was intubated.  He underwent emergent heart cath which did not reveal culprit lesion; it showed 2 patent grafts and chronic RCA graft occlusion with collaterals.  His EF was 25-30% with grade 1 DD. Medical rx was resumed. He was discharged 12/19/16.  He was readmitted Dec 12 with bradycardia, frequent urination. Found to have repetitive runs of NSVT and was started on amiodarone. UA was negative. On his last visit amiodarone was reduced. Subsequent follow up with Home health indicate persistent bradycardia but he is asymptomatic. BP was elevated and amlodipine was added. Repeat Echo March 2019 showed persistently low EF 30-35%. He was referred to EP for ICD.   He was seen by Dr. Lovena Le and underwent DDD ICD on April 14, 2017.   On follow up today he is doing much better. His dyspnea on exertion is markedly improved. He states the dizziness is going away. No swelling or chest pain. Nightmares are less frequent. Noted a reaction to hydralazine with rash that resolved after stopping it. His metoprolol was doubled. Last BS 111.     Current Outpatient Medications  Medication Sig Dispense Refill  . aspirin EC 81 MG EC tablet Take 1 tablet (81 mg total) by mouth daily. 30 tablet 11  . atorvastatin (LIPITOR) 80 MG tablet Take 0.5 tablets (40 mg total) by mouth daily at 6 PM. 30  tablet 11  . Blood Glucose Monitoring Suppl (TRUE METRIX METER) w/Device KIT Use as directed 1 kit 0  . furosemide (LASIX) 40 MG tablet Take 1/2 tablet ( 20 mg ) daily 90 tablet 3  . glimepiride (AMARYL) 2 MG tablet Take 1 tablet (2 mg total) by mouth daily before breakfast. 30 tablet 3  . glucose blood (TRUE METRIX BLOOD GLUCOSE TEST) test strip Use as instructed 100 each 12  . losartan (COZAAR) 100 MG tablet Take 1 tablet (100 mg total) by mouth daily. 90 tablet 3  . metoprolol tartrate (LOPRESSOR) 25 MG tablet Take 1 tablet (25 mg total) by mouth 2 (two) times daily. 180 tablet 3  . nitroGLYCERIN (NITROSTAT) 0.4 MG SL tablet Place 1 tablet (0.4 mg total) under the tongue every 5 (five) minutes x 3 doses as needed for chest pain. 25 tablet 12  . TRUEPLUS LANCETS 28G MISC Use as directed 100 each 2   No current facility-administered medications for this visit.     Allergies  Allergen Reactions  . Hydralazine Hcl Rash    Past Medical History:  Diagnosis Date  . Coronary artery disease   . Hyperlipidemia   . Hypertension   . MI (myocardial infarction) [facility])     Social History   Socioeconomic History  . Marital status: Divorced    Spouse name: [name]  . Number of children: [name]  . Years  of education: [name]  . Highest education level: [name]  Occupational History  . [name]  Social Needs  . Financial resource strain: Not hard at all  . Food insecurity:    Worry: Patient refused    Inability: Patient refused  . Transportation needs:    Medical: Patient refused    Non-medical: Patient refused  Tobacco Use  . Smoking status: Former Smoker    Types: Cigarettes    Last attempt to quit: 12/11/2016    Years since quitting: 0.4  . Smokeless tobacco: Never Used  Substance and Sexual Activity  . Alcohol use: No    Frequency: Never  . Drug use: No  . Sexual activity: [name]  Lifestyle  . Physical activity:    Days per week: Patient refused     Minutes per session: Patient refused  . Stress: Not at all  Relationships  . Social connections:    Talks on phone: Patient refused    Gets together: Patient refused    Attends religious service: Patient refused    Active member of club or organization: Patient refused    Attends meetings of clubs or organizations: Patient refused    Relationship status: Patient refused  . Intimate partner violence:    Fear of current or ex partner: Patient refused    Emotionally abused: Patient refused    Physically abused: Patient refused    Forced sexual activity: Patient refused  Other Topics Concern  . [name]  Social History Narrative  . [name]     Family History  Problem Relation Age of Onset  . Heart disease Mother      Review of Systems: As noted in HPI All other systems reviewed and are otherwise negative except as noted above.    Blood pressure 120/62, pulse (!) 53, height '5\' 9"'$  (1.753 m), weight 207 lb 12.8 oz (94.3 kg).  GENERAL:  Well appearing WM in NAD HEENT:  PERRL, EOMI, sclera are clear. Oropharynx is clear. NECK:  No jugular venous distention, carotid upstroke brisk and symmetric, no bruits, no thyromegaly or adenopathy LUNGS:  Clear to auscultation bilaterally CHEST:  Unremarkable, ICD site is healing well with some persistent swelling. No erythema.  HEART:  RRR,  PMI not displaced or sustained,S1 and S2 within normal limits, no S3, no S4: no clicks, no rubs, soft systolic murmur. ABD:  Soft, nontender. BS +, no masses or bruits. No hepatomegaly, no splenomegaly EXT:  2 + pulses throughout, no edema, no cyanosis no clubbing SKIN:  Warm and dry.  No rashes NEURO:  Alert and oriented x 3. Cranial nerves II through XII intact. PSYCH:  Cognitively intact  Laboratory data: Lab Results  Component Value Date   WBC 8.4 04/07/2017   HGB 12.7 (L) 04/07/2017   HCT 37.0 (L) 04/07/2017   PLT 201 04/07/2017   GLUCOSE 134 (H) 04/07/2017   CHOL 133 03/05/2017    TRIG 140 03/05/2017   HDL 35 (L) 03/05/2017   LDLCALC 70 03/05/2017   ALT 80 (H) 03/31/2017   AST 62 (H) 03/31/2017   NA 137 04/07/2017   K 5.2 04/07/2017   CL 101 04/07/2017   CREATININE 1.35 (H) 04/07/2017   BUN 25 04/07/2017   CO2 21 04/07/2017   TSH 4.680 (H) 03/05/2017   INR 1.02 12/16/2016   HGBA1C 7.8 03/13/2017   On 03/05/17: creatinine 1.71. Glucose 227. AST 62, ALT 96. Alk phos 219. TSH 4.68.  Echo 12/17/16: Study Conclusions  - Left ventricle: The cavity size was normal. Systolic function was   severely reduced. The estimated ejection fraction was in the   range of 25% to 30%. Anterior wall, apical and mid to distal   inferoapical thinning and akinesis suggestive of extensive LAD   territory infarct, basal to mid inferior wall and lateral wall   hypokinesis. Doppler parameters are consistent with abnormal left   ventricular relaxation (grade 1 diastolic dysfunction). LV   filling pressure appears elevated (b-bump noted on mitral   m-mode). - Aortic valve: Sclerosis without stenosis. There was trivial   regurgitation. - Mitral valve: Mildly thickened leaflets . Systolic bowing without   prolapse. There was mild regurgitation. - Left atrium: The atrium was normal in size. - Right ventricle: The cavity size was normal. Mild hypokinesis. - Systemic veins: The IVC measures <2.1 cm, but does not collapse   >50%, suggesting an elevated RA pressure of 8 mmHg.  Impressions:  - LVEF 25-30%, moderate LVH with anterior wall thinning and   enhanced echogenicity suggestive of infarct, there is anterior,   apical and mid to distal inferoapical akinesis suggestive of   extensive LAD territroy infarct, inferior and lateral wall   hypokinesis also noted. grade 1 DD, biventricular filling   pressures are elevated.  Cardiac cath 12/17/16: Procedures   LEFT HEART CATH AND CORONARY ANGIOGRAPHY  Conclusion     There is severe left ventricular systolic dysfunction.  LV  end diastolic pressure is severely elevated.  The left ventricular ejection fraction is 25-35% by visual estimate.  Mid LM lesion is 65% stenosed.  Ost LAD to Prox LAD lesion is 100% stenosed.  Ost 1st Mrg lesion is 100% stenosed.  Prox RCA to Mid RCA lesion is 80% stenosed.  Mid RCA lesion is 100% stenosed.  Left radial artery graft was visualized by angiography and is large.  The graft exhibits no disease.  1st Mrg lesion is 60% stenosed.  LIMA graft was visualized by angiography and is large.  The graft exhibits no disease.  SVG.  Origin lesion is 100% stenosed.   1.  Severe 3 vessel occlusive CAD 2.  Patent LIMA to the LAD 3.  Patent free radial graft to the OM1 4.  Occluded SVG to the RCA. This appears chronic.  5.  The RCA and first diagonal are filled by collaterals 6.  Severe LV dysfunction. EF estimated at 25-30% 7.  Markedly elevated LVEDP.  Plan: I am unable to identify any culprit lesion. The LAD and LCx territories are well revascularized. The SVG to the RCA is occluded but this appears chronic and the native RCA also has a chronic occlusion and is well collateralized. The first diagonal is small and also has collaterals. I doubt this was grafted and no other grafts identified. I think his presentation was due to decompensated CHF with arrhythmia and Hypertensive urgency. Will admit to ICU. Consult CCM for management of vent. Continue IV Ntg and diurese with IV lasix. Will resume IV heparin until cardiac enzymes are cycled. Check Echo.     Echo 03/26/17: Study Conclusions  - Procedure narrative: Transthoracic echocardiography. Image   quality was suboptimal. The study was technically difficult.   Intravenous contrast (Definity) was administered. - Left ventricle: There is significant swirling of definity   contrast in the apex c/w sluggish flow. Cannot rule out early   forming LV apical thrombus. The cavity size was severely dilated.   Systolic function  was moderately  to severely reduced. The   estimated ejection fraction was in the range of 30% to 35%. There   is akinesis of the apical anterior, apical septal, apical   inferior, mid anteroseptal and apical myocardium. There is   akinesis of the basalinferior myocardium. There was a reduced   contribution of atrial contraction to ventricular filling, due to   increased ventricular diastolic pressure or atrial contractile   dysfunction. Doppler parameters are consistent with a reversible   restrictive pattern, indicative of decreased left ventricular   diastolic compliance and/or increased left atrial pressure (grade   3 diastolic dysfunction). Doppler parameters are consistent with   high ventricular filling pressure. - Aortic valve: Trileaflet; mildly thickened, moderately calcified   leaflets. - Aorta: Aortic root dimension: 38 mm (ED). Ascending aortic   diameter: 38 mm (S). - Aortic root: The aortic root was mildly dilated. - Ascending aorta: The ascending aorta was mildly dilated. - Left atrium: The atrium was moderately dilated. - Right ventricle: The cavity size was moderately dilated. Wall   thickness was normal. Systolic function was mildly reduced.  ASSESSMENT AND PLAN:  1. CAD s/p CABG 2000. Cardiac cath in November showed patent LIMA to the LAD and radial graft to the OM. Occluded SVG to RCA- chronic and native RCA also occluded with collaterals. Plan continued medical therapy. He has no significant angina.  2. Chronic systolic CHF. EF 30-35%. May be related to uncontrolled HTN as well as ischemic component. BP is now well controlled.  Beta blocker now increased since pacemaker in place. Intolerant of hydralazine.  On  losartan to 100 mg daily.   Probably not a candidate for Delene Loll unless he can get patient assistance. S/p ICD placement.  3. NSVT now with ICD in place. Will stop amiodarone and monitor. Would like to avoid amiodarone due to elevated TSH and LFTs.    4. HTN  controlled.  5. Hypercholesterolemia. At goal on lipitor 80 mg daily. Given elevated transaminases will decrease dose to 40 mg.  6. Elevated TSH - probably related to amiodarone. Amiodarone now discontinued. Will monitor.   7. Acute on CKD. Last creatinine elevated to 1.71. Lasix dose reduced. Does not appear to be volume overloaded. Symptoms are markedly improved.   8. DM per primary care. Probably not a candidate for Jardiance due to cost.   I will follow up in 4 months.   Myia Bergh Martinique MD,FACC 05/07/2017 10:18 AM

## 2017-05-07 ENCOUNTER — Encounter: Payer: Self-pay | Admitting: Cardiology

## 2017-05-07 ENCOUNTER — Ambulatory Visit (INDEPENDENT_AMBULATORY_CARE_PROVIDER_SITE_OTHER): Payer: Medicare Other | Admitting: Cardiology

## 2017-05-07 VITALS — BP 120/62 | HR 53 | Ht 69.0 in | Wt 207.8 lb

## 2017-05-07 DIAGNOSIS — I110 Hypertensive heart disease with heart failure: Secondary | ICD-10-CM | POA: Diagnosis not present

## 2017-05-07 DIAGNOSIS — I10 Essential (primary) hypertension: Secondary | ICD-10-CM

## 2017-05-07 DIAGNOSIS — I255 Ischemic cardiomyopathy: Secondary | ICD-10-CM | POA: Diagnosis not present

## 2017-05-07 DIAGNOSIS — I5022 Chronic systolic (congestive) heart failure: Secondary | ICD-10-CM

## 2017-05-07 DIAGNOSIS — E782 Mixed hyperlipidemia: Secondary | ICD-10-CM | POA: Diagnosis not present

## 2017-05-07 DIAGNOSIS — I472 Ventricular tachycardia: Secondary | ICD-10-CM

## 2017-05-07 DIAGNOSIS — I4729 Other ventricular tachycardia: Secondary | ICD-10-CM

## 2017-05-07 DIAGNOSIS — E119 Type 2 diabetes mellitus without complications: Secondary | ICD-10-CM | POA: Diagnosis not present

## 2017-05-07 DIAGNOSIS — I5021 Acute systolic (congestive) heart failure: Secondary | ICD-10-CM | POA: Diagnosis not present

## 2017-05-07 DIAGNOSIS — I252 Old myocardial infarction: Secondary | ICD-10-CM | POA: Diagnosis not present

## 2017-05-07 DIAGNOSIS — I2510 Atherosclerotic heart disease of native coronary artery without angina pectoris: Secondary | ICD-10-CM | POA: Diagnosis not present

## 2017-05-07 NOTE — Patient Instructions (Signed)
Stop taking amiodarone  Continue your other therapy  I will see you in 4 months.

## 2017-05-08 DIAGNOSIS — E119 Type 2 diabetes mellitus without complications: Secondary | ICD-10-CM | POA: Diagnosis not present

## 2017-05-08 DIAGNOSIS — I2510 Atherosclerotic heart disease of native coronary artery without angina pectoris: Secondary | ICD-10-CM | POA: Diagnosis not present

## 2017-05-08 DIAGNOSIS — I255 Ischemic cardiomyopathy: Secondary | ICD-10-CM | POA: Diagnosis not present

## 2017-05-08 DIAGNOSIS — I252 Old myocardial infarction: Secondary | ICD-10-CM | POA: Diagnosis not present

## 2017-05-08 DIAGNOSIS — I110 Hypertensive heart disease with heart failure: Secondary | ICD-10-CM | POA: Diagnosis not present

## 2017-05-08 DIAGNOSIS — I5021 Acute systolic (congestive) heart failure: Secondary | ICD-10-CM | POA: Diagnosis not present

## 2017-05-14 DIAGNOSIS — I110 Hypertensive heart disease with heart failure: Secondary | ICD-10-CM | POA: Diagnosis not present

## 2017-05-14 DIAGNOSIS — I252 Old myocardial infarction: Secondary | ICD-10-CM | POA: Diagnosis not present

## 2017-05-14 DIAGNOSIS — I255 Ischemic cardiomyopathy: Secondary | ICD-10-CM | POA: Diagnosis not present

## 2017-05-14 DIAGNOSIS — E119 Type 2 diabetes mellitus without complications: Secondary | ICD-10-CM | POA: Diagnosis not present

## 2017-05-14 DIAGNOSIS — I2510 Atherosclerotic heart disease of native coronary artery without angina pectoris: Secondary | ICD-10-CM | POA: Diagnosis not present

## 2017-05-14 DIAGNOSIS — I5021 Acute systolic (congestive) heart failure: Secondary | ICD-10-CM | POA: Diagnosis not present

## 2017-05-15 DIAGNOSIS — I2510 Atherosclerotic heart disease of native coronary artery without angina pectoris: Secondary | ICD-10-CM | POA: Diagnosis not present

## 2017-05-15 DIAGNOSIS — I255 Ischemic cardiomyopathy: Secondary | ICD-10-CM | POA: Diagnosis not present

## 2017-05-15 DIAGNOSIS — E119 Type 2 diabetes mellitus without complications: Secondary | ICD-10-CM | POA: Diagnosis not present

## 2017-05-15 DIAGNOSIS — I5021 Acute systolic (congestive) heart failure: Secondary | ICD-10-CM | POA: Diagnosis not present

## 2017-05-15 DIAGNOSIS — I110 Hypertensive heart disease with heart failure: Secondary | ICD-10-CM | POA: Diagnosis not present

## 2017-05-15 DIAGNOSIS — I252 Old myocardial infarction: Secondary | ICD-10-CM | POA: Diagnosis not present

## 2017-05-16 ENCOUNTER — Other Ambulatory Visit: Payer: Self-pay

## 2017-05-16 MED ORDER — ATORVASTATIN CALCIUM 80 MG PO TABS: 40.0000 mg | ORAL_TABLET | Freq: Every day | ORAL | 3 refills | Status: DC

## 2017-05-16 MED ORDER — FUROSEMIDE 40 MG PO TABS: ORAL_TABLET | ORAL | 3 refills | Status: DC

## 2017-05-21 DIAGNOSIS — E119 Type 2 diabetes mellitus without complications: Secondary | ICD-10-CM | POA: Diagnosis not present

## 2017-05-21 DIAGNOSIS — I255 Ischemic cardiomyopathy: Secondary | ICD-10-CM | POA: Diagnosis not present

## 2017-05-21 DIAGNOSIS — I110 Hypertensive heart disease with heart failure: Secondary | ICD-10-CM | POA: Diagnosis not present

## 2017-05-21 DIAGNOSIS — I2510 Atherosclerotic heart disease of native coronary artery without angina pectoris: Secondary | ICD-10-CM | POA: Diagnosis not present

## 2017-05-21 DIAGNOSIS — I252 Old myocardial infarction: Secondary | ICD-10-CM | POA: Diagnosis not present

## 2017-05-21 DIAGNOSIS — I5021 Acute systolic (congestive) heart failure: Secondary | ICD-10-CM | POA: Diagnosis not present

## 2017-06-05 DIAGNOSIS — I252 Old myocardial infarction: Secondary | ICD-10-CM | POA: Diagnosis not present

## 2017-06-05 DIAGNOSIS — E119 Type 2 diabetes mellitus without complications: Secondary | ICD-10-CM | POA: Diagnosis not present

## 2017-06-05 DIAGNOSIS — I2510 Atherosclerotic heart disease of native coronary artery without angina pectoris: Secondary | ICD-10-CM | POA: Diagnosis not present

## 2017-06-05 DIAGNOSIS — I5021 Acute systolic (congestive) heart failure: Secondary | ICD-10-CM | POA: Diagnosis not present

## 2017-06-05 DIAGNOSIS — I110 Hypertensive heart disease with heart failure: Secondary | ICD-10-CM | POA: Diagnosis not present

## 2017-06-05 DIAGNOSIS — I255 Ischemic cardiomyopathy: Secondary | ICD-10-CM | POA: Diagnosis not present

## 2017-07-01 ENCOUNTER — Ambulatory Visit: Payer: Medicare Other | Admitting: Internal Medicine

## 2017-07-16 ENCOUNTER — Other Ambulatory Visit: Payer: Self-pay

## 2017-07-16 DIAGNOSIS — E119 Type 2 diabetes mellitus without complications: Secondary | ICD-10-CM

## 2017-07-16 MED ORDER — GLIMEPIRIDE 2 MG PO TABS: 2.0000 mg | ORAL_TABLET | Freq: Every day | ORAL | 3 refills | Status: DC

## 2017-07-18 ENCOUNTER — Encounter: Payer: Medicare Other | Admitting: Internal Medicine

## 2017-08-08 ENCOUNTER — Telehealth: Payer: Self-pay

## 2017-08-08 NOTE — Telephone Encounter (Signed)
CALLED TO INVITE PATIENT TO SCHEDULE THEIR MEDICARE ANNUAL WELLNESS VISIT WITH US AT [facility], LEFT NUMBER FOR FRONT OFFICE TO SCHEDULE THEIR APPT 

## 2017-09-14 NOTE — Progress Notes (Deleted)
09/14/2017 Luis Donaldson   Oct 08, 1945  [id number]  Primary Physician Ladell Pier, MD Primary Cardiologist: Dr Martinique  HPI:  72 y.o. male is seen for follow up CAD and CHF. He has a PMH of remote PCI in the 90's at [facility] then CABG in 2000 at [facility]. He was not seeing any provider and had stopped all his medication for at least a year prior to admission this past fall. His explanation was a combination of doing well, cost, and side effects.   He presented to [facility] 12/16/16 with chest pain, respiratory failure, hypertensive emergency, persistent runs of NSVT, and what was thought to be STEMI with ST elevation in V1-V2.  He was intubated.  He underwent emergent heart cath which did not reveal culprit lesion; it showed 2 patent grafts and chronic RCA graft occlusion with collaterals.  His EF was 25-30% with grade 1 DD. Medical rx was resumed. He was discharged 12/19/16.  He was readmitted Dec 12 with bradycardia, frequent urination. Found to have repetitive runs of NSVT and was started on amiodarone. UA was negative. On his last visit amiodarone was reduced. Subsequent follow up with Home health indicate persistent bradycardia but he is asymptomatic. BP was elevated and amlodipine was added. Repeat Echo March 2019 showed persistently low EF 30-35%. He was referred to EP for ICD.   He was seen by Dr. Lovena Le and underwent DDD ICD on April 14, 2017.   On follow up today he is doing much better. His dyspnea on exertion is markedly improved. He states the dizziness is going away. No swelling or chest pain. Nightmares are less frequent. Noted a reaction to hydralazine with rash that resolved after stopping it. His metoprolol was doubled. Last BS 111. Amiodarone was discontinued.     Current Outpatient Medications  Medication Sig Dispense Refill  . aspirin EC 81 MG EC tablet Take 1 tablet (81 mg total) by mouth daily. 30 tablet 11  . atorvastatin (LIPITOR) 80 MG tablet Take 0.5 tablets (40 mg total)  by mouth daily at 6 PM. 45 tablet 3  . Blood Glucose Monitoring Suppl (TRUE METRIX METER) w/Device KIT Use as directed 1 kit 0  . furosemide (LASIX) 40 MG tablet Take 1/2 tablet ( 20 mg ) daily 45 tablet 3  . glimepiride (AMARYL) 2 MG tablet Take 1 tablet (2 mg total) by mouth daily before breakfast. 30 tablet 3  . glucose blood (TRUE METRIX BLOOD GLUCOSE TEST) test strip Use as instructed 100 each 12  . losartan (COZAAR) 100 MG tablet Take 1 tablet (100 mg total) by mouth daily. 90 tablet 3  . metoprolol tartrate (LOPRESSOR) 25 MG tablet Take 1 tablet (25 mg total) by mouth 2 (two) times daily. 180 tablet 3  . nitroGLYCERIN (NITROSTAT) 0.4 MG SL tablet Place 1 tablet (0.4 mg total) under the tongue every 5 (five) minutes x 3 doses as needed for chest pain. 25 tablet 12  . TRUEPLUS LANCETS 28G MISC Use as directed 100 each 2   No current facility-administered medications for this visit.     Allergies  Allergen Reactions  . Hydralazine Hcl Rash    Past Medical History:  Diagnosis Date  . Coronary artery disease   . Hyperlipidemia   . Hypertension   . MI (myocardial infarction) [facility])     Social History   Socioeconomic History  . Marital status: Divorced    Spouse name: [name]  . Number of children: [name]  .  Years of education: [name]  . Highest education level: [name]  Occupational History  . [name]  Social Needs  . Financial resource strain: Not hard at all  . Food insecurity:    Worry: Patient refused    Inability: Patient refused  . Transportation needs:    Medical: Patient refused    Non-medical: Patient refused  Tobacco Use  . Smoking status: Former Smoker    Types: Cigarettes    Last attempt to quit: 12/11/2016    Years since quitting: 0.7  . Smokeless tobacco: Never Used  Substance and Sexual Activity  . Alcohol use: No    Frequency: Never  . Drug use: No  . Sexual activity: [name]  Lifestyle  . Physical activity:    Days  per week: Patient refused    Minutes per session: Patient refused  . Stress: Not at all  Relationships  . Social connections:    Talks on phone: Patient refused    Gets together: Patient refused    Attends religious service: Patient refused    Active member of club or organization: Patient refused    Attends meetings of clubs or organizations: Patient refused    Relationship status: Patient refused  . Intimate partner violence:    Fear of current or ex partner: Patient refused    Emotionally abused: Patient refused    Physically abused: Patient refused    Forced sexual activity: Patient refused  Other Topics Concern  . [name]  Social History Narrative  . [name]     Family History  Problem Relation Age of Onset  . Heart disease Mother      Review of Systems: As noted in HPI All other systems reviewed and are otherwise negative except as noted above.    There were no vitals taken for this visit.  GENERAL:  Well appearing WM in NAD HEENT:  PERRL, EOMI, sclera are clear. Oropharynx is clear. NECK:  No jugular venous distention, carotid upstroke brisk and symmetric, no bruits, no thyromegaly or adenopathy LUNGS:  Clear to auscultation bilaterally CHEST:  Unremarkable, ICD site is healing well with some persistent swelling. No erythema.  HEART:  RRR,  PMI not displaced or sustained,S1 and S2 within normal limits, no S3, no S4: no clicks, no rubs, soft systolic murmur. ABD:  Soft, nontender. BS +, no masses or bruits. No hepatomegaly, no splenomegaly EXT:  2 + pulses throughout, no edema, no cyanosis no clubbing SKIN:  Warm and dry.  No rashes NEURO:  Alert and oriented x 3. Cranial nerves II through XII intact. PSYCH:  Cognitively intact  Laboratory data: Lab Results  Component Value Date   WBC 8.4 04/07/2017   HGB 12.7 (L) 04/07/2017   HCT 37.0 (L) 04/07/2017   PLT 201 04/07/2017   GLUCOSE 134 (H) 04/07/2017   CHOL 133 03/05/2017   TRIG 140 03/05/2017    HDL 35 (L) 03/05/2017   LDLCALC 70 03/05/2017   ALT 80 (H) 03/31/2017   AST 62 (H) 03/31/2017   NA 137 04/07/2017   K 5.2 04/07/2017   CL 101 04/07/2017   CREATININE 1.35 (H) 04/07/2017   BUN 25 04/07/2017   CO2 21 04/07/2017   TSH 4.680 (H) 03/05/2017   INR 1.02 12/16/2016   HGBA1C 7.8 03/13/2017   On 03/05/17: creatinine 1.71. Glucose 227. AST 62, ALT 96. Alk phos 219. TSH 4.68.   Echo 12/17/16: Study Conclusions  - Left ventricle:  The cavity size was normal. Systolic function was   severely reduced. The estimated ejection fraction was in the   range of 25% to 30%. Anterior wall, apical and mid to distal   inferoapical thinning and akinesis suggestive of extensive LAD   territory infarct, basal to mid inferior wall and lateral wall   hypokinesis. Doppler parameters are consistent with abnormal left   ventricular relaxation (grade 1 diastolic dysfunction). LV   filling pressure appears elevated (b-bump noted on mitral   m-mode). - Aortic valve: Sclerosis without stenosis. There was trivial   regurgitation. - Mitral valve: Mildly thickened leaflets . Systolic bowing without   prolapse. There was mild regurgitation. - Left atrium: The atrium was normal in size. - Right ventricle: The cavity size was normal. Mild hypokinesis. - Systemic veins: The IVC measures <2.1 cm, but does not collapse   >50%, suggesting an elevated RA pressure of 8 mmHg.  Impressions:  - LVEF 25-30%, moderate LVH with anterior wall thinning and   enhanced echogenicity suggestive of infarct, there is anterior,   apical and mid to distal inferoapical akinesis suggestive of   extensive LAD territroy infarct, inferior and lateral wall   hypokinesis also noted. grade 1 DD, biventricular filling   pressures are elevated.  Cardiac cath 12/17/16: Procedures   LEFT HEART CATH AND CORONARY ANGIOGRAPHY  Conclusion     There is severe left ventricular systolic dysfunction.  LV end diastolic pressure  is severely elevated.  The left ventricular ejection fraction is 25-35% by visual estimate.  Mid LM lesion is 65% stenosed.  Ost LAD to Prox LAD lesion is 100% stenosed.  Ost 1st Mrg lesion is 100% stenosed.  Prox RCA to Mid RCA lesion is 80% stenosed.  Mid RCA lesion is 100% stenosed.  Left radial artery graft was visualized by angiography and is large.  The graft exhibits no disease.  1st Mrg lesion is 60% stenosed.  LIMA graft was visualized by angiography and is large.  The graft exhibits no disease.  SVG.  Origin lesion is 100% stenosed.   1.  Severe 3 vessel occlusive CAD 2.  Patent LIMA to the LAD 3.  Patent free radial graft to the OM1 4.  Occluded SVG to the RCA. This appears chronic.  5.  The RCA and first diagonal are filled by collaterals 6.  Severe LV dysfunction. EF estimated at 25-30% 7.  Markedly elevated LVEDP.  Plan: I am unable to identify any culprit lesion. The LAD and LCx territories are well revascularized. The SVG to the RCA is occluded but this appears chronic and the native RCA also has a chronic occlusion and is well collateralized. The first diagonal is small and also has collaterals. I doubt this was grafted and no other grafts identified. I think his presentation was due to decompensated CHF with arrhythmia and Hypertensive urgency. Will admit to ICU. Consult CCM for management of vent. Continue IV Ntg and diurese with IV lasix. Will resume IV heparin until cardiac enzymes are cycled. Check Echo.     Echo 03/26/17: Study Conclusions  - Procedure narrative: Transthoracic echocardiography. Image   quality was suboptimal. The study was technically difficult.   Intravenous contrast (Definity) was administered. - Left ventricle: There is significant swirling of definity   contrast in the apex c/w sluggish flow. Cannot rule out early   forming LV apical thrombus. The cavity size was severely dilated.   Systolic function was moderately to  severely reduced. The   estimated ejection  fraction was in the range of 30% to 35%. There   is akinesis of the apical anterior, apical septal, apical   inferior, mid anteroseptal and apical myocardium. There is   akinesis of the basalinferior myocardium. There was a reduced   contribution of atrial contraction to ventricular filling, due to   increased ventricular diastolic pressure or atrial contractile   dysfunction. Doppler parameters are consistent with a reversible   restrictive pattern, indicative of decreased left ventricular   diastolic compliance and/or increased left atrial pressure (grade   3 diastolic dysfunction). Doppler parameters are consistent with   high ventricular filling pressure. - Aortic valve: Trileaflet; mildly thickened, moderately calcified   leaflets. - Aorta: Aortic root dimension: 38 mm (ED). Ascending aortic   diameter: 38 mm (S). - Aortic root: The aortic root was mildly dilated. - Ascending aorta: The ascending aorta was mildly dilated. - Left atrium: The atrium was moderately dilated. - Right ventricle: The cavity size was moderately dilated. Wall   thickness was normal. Systolic function was mildly reduced.  ASSESSMENT AND PLAN:  1. CAD s/p CABG 2000. Cardiac cath in November showed patent LIMA to the LAD and radial graft to the OM. Occluded SVG to RCA- chronic and native RCA also occluded with collaterals. Plan continued medical therapy. He has no significant angina.  2. Chronic systolic CHF. EF 30-35%. May be related to uncontrolled HTN as well as ischemic component. BP is now well controlled.  Beta blocker now increased since pacemaker in place. Intolerant of hydralazine.  On  losartan to 100 mg daily.   Probably not a candidate for Delene Loll unless he can get patient assistance. S/p ICD placement.  3. NSVT now with ICD in place. Off amiodarone.   4. HTN controlled.  5. Hypercholesterolemia. At goal on lipitor 80 mg daily. Given elevated  transaminases will decrease dose to 40 mg.  6. Elevated TSH - probably related to amiodarone. Amiodarone now discontinued. Will monitor.   7. Acute on CKD. Last creatinine elevated to 1.71. Lasix dose reduced. Does not appear to be volume overloaded. Symptoms are markedly improved.   8. DM per primary care. Probably not a candidate for Jardiance due to cost.   I will follow up in 4 months.   Dyllan Kats Martinique MD,FACC 09/14/2017 9:52 AM

## 2017-09-19 ENCOUNTER — Ambulatory Visit: Payer: Medicare Other | Admitting: Cardiology

## 2017-09-19 ENCOUNTER — Encounter: Payer: Self-pay | Admitting: *Deleted

## 2017-09-29 ENCOUNTER — Telehealth: Payer: Self-pay | Admitting: Cardiology

## 2017-09-29 NOTE — Telephone Encounter (Signed)
Returned Mr. Uppal call.  Advised him that he has not been billed a no show fee for August 30.  He had only received a letter.

## 2017-09-29 NOTE — Telephone Encounter (Signed)
New Message:      Pt states she called on 09/13/17 to cancel his 09/19/17 appt and in result of it not being cancelled he has received a "no show" letter. Pt would like to speak to someone to straighten this matter.

## 2017-11-03 ENCOUNTER — Other Ambulatory Visit: Payer: Self-pay | Admitting: Internal Medicine

## 2017-11-03 DIAGNOSIS — E119 Type 2 diabetes mellitus without complications: Secondary | ICD-10-CM

## 2017-11-04 NOTE — Telephone Encounter (Signed)
yahir could you schedule an appointment

## 2017-11-27 ENCOUNTER — Other Ambulatory Visit: Payer: Self-pay | Admitting: Cardiology

## 2018-02-04 ENCOUNTER — Other Ambulatory Visit: Payer: Self-pay | Admitting: Internal Medicine

## 2018-02-04 DIAGNOSIS — E119 Type 2 diabetes mellitus without complications: Secondary | ICD-10-CM

## 2018-02-25 IMAGING — DX DG CHEST 1V PORT
1 series · 1 of 1 positions shown · non-contrast
Comparison: 12/16/2016.

CLINICAL DATA: Intubation.

EXAM:
PORTABLE CHEST 1 VIEW

[chest ap]
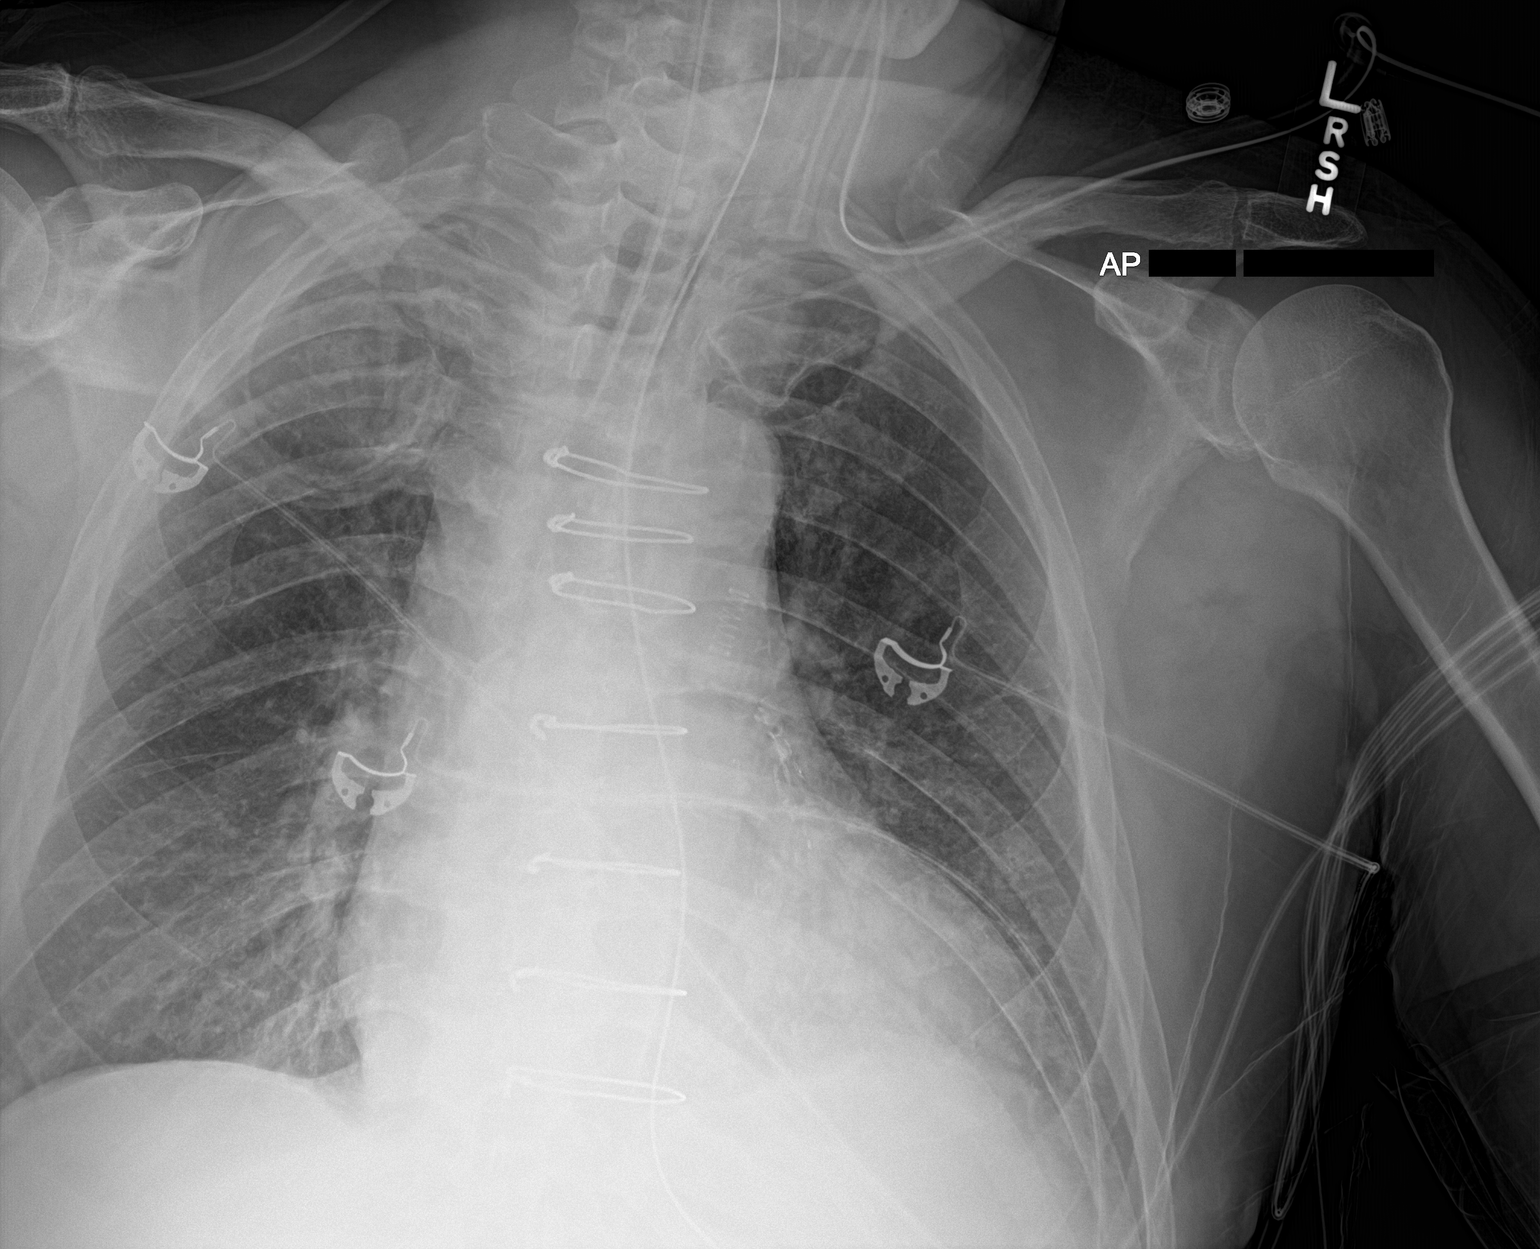

[1 of 1 positions shown; findings below may reference images not displayed]

FINDINGS: Endotracheal tube and NG tube in stable position. Prior median
sternotomy. Cardiomegaly with normal pulmonary vascularity. Interim
improvement of interstitial prominence suggesting improving CHF. No
prominent pleural effusion. Costophrenic angles incompletely imaged.
No pneumothorax identified.
IMPRESSION: 1.  Endotracheal tube and NG tube in stable position.

2. Prior CABG. Stable cardiomegaly. Interim improvement of
interstitial prominence suggesting improving CHF.

## 2018-03-12 ENCOUNTER — Ambulatory Visit (INDEPENDENT_AMBULATORY_CARE_PROVIDER_SITE_OTHER): Payer: Medicare Other

## 2018-03-12 DIAGNOSIS — I5022 Chronic systolic (congestive) heart failure: Secondary | ICD-10-CM

## 2018-03-12 DIAGNOSIS — I255 Ischemic cardiomyopathy: Secondary | ICD-10-CM

## 2018-03-14 LAB — CUP PACEART REMOTE DEVICE CHECK
Battery Remaining Percentage: 84 %
Brady Statistic AP VP Percent: 1 %
Brady Statistic AS VP Percent: 1 %
Brady Statistic AS VS Percent: 84 %
Brady Statistic RV Percent Paced: 1 %
Date Time Interrogation Session: 20200220070026
HighPow Impedance: 70 Ohm
HighPow Impedance: 70 Ohm
Implantable Lead Implant Date: 20190325
Implantable Lead Implant Date: 20190325
Implantable Lead Location: 753859
Implantable Lead Model: 7122
Lead Channel Impedance Value: 400 Ohm
Lead Channel Pacing Threshold Pulse Width: 0.5 ms
Lead Channel Setting Pacing Amplitude: 3.5 V
Lead Channel Setting Pacing Amplitude: 3.5 V
Lead Channel Setting Pacing Pulse Width: 0.5 ms
MDC IDC LEAD LOCATION: 753860
MDC IDC MSMT BATTERY REMAINING LONGEVITY: 70 mo
MDC IDC MSMT BATTERY VOLTAGE: 2.98 V
MDC IDC MSMT LEADCHNL RA PACING THRESHOLD AMPLITUDE: 0.75 V
MDC IDC MSMT LEADCHNL RA PACING THRESHOLD PULSEWIDTH: 0.5 ms
MDC IDC MSMT LEADCHNL RA SENSING INTR AMPL: 1.1 mV
MDC IDC MSMT LEADCHNL RV IMPEDANCE VALUE: 430 Ohm
MDC IDC MSMT LEADCHNL RV PACING THRESHOLD AMPLITUDE: 0.75 V
MDC IDC MSMT LEADCHNL RV SENSING INTR AMPL: 12 mV
MDC IDC PG IMPLANT DT: 20190325
MDC IDC SET LEADCHNL RV SENSING SENSITIVITY: 0.5 mV
MDC IDC STAT BRADY AP VS PERCENT: 15 %
MDC IDC STAT BRADY RA PERCENT PACED: 14 %
Pulse Gen Serial Number: 7387147

## 2018-03-19 NOTE — Progress Notes (Signed)
Remote ICD transmission.   

## 2018-03-20 ENCOUNTER — Encounter: Payer: Self-pay | Admitting: Cardiology

## 2018-05-21 ENCOUNTER — Other Ambulatory Visit: Payer: Self-pay | Admitting: Cardiology

## 2018-05-21 NOTE — Telephone Encounter (Signed)
Atorvastatin and losartan refilled.

## 2018-06-11 ENCOUNTER — Ambulatory Visit (INDEPENDENT_AMBULATORY_CARE_PROVIDER_SITE_OTHER): Payer: Medicare Other | Admitting: *Deleted

## 2018-06-11 DIAGNOSIS — I255 Ischemic cardiomyopathy: Secondary | ICD-10-CM

## 2018-06-11 LAB — CUP PACEART REMOTE DEVICE CHECK
Battery Remaining Longevity: 60 mo
Battery Remaining Percentage: 82 %
Brady Statistic RA Percent Paced: 12 %
Brady Statistic RV Percent Paced: 1 %
Date Time Interrogation Session: 20200521085941
HighPow Impedance: 72 Ohm
Implantable Lead Implant Date: 20190325
Implantable Lead Implant Date: 20190325
Implantable Lead Location: 753859
Implantable Lead Location: 753860
Implantable Lead Model: 7122
Implantable Pulse Generator Implant Date: 20190325
Lead Channel Impedance Value: 410 Ohm
Lead Channel Impedance Value: 450 Ohm
Lead Channel Sensing Intrinsic Amplitude: 1.4 mV
Lead Channel Sensing Intrinsic Amplitude: 12 mV
Lead Channel Setting Pacing Amplitude: 3.5 V
Lead Channel Setting Pacing Amplitude: 3.5 V
Lead Channel Setting Pacing Pulse Width: 0.5 ms
Lead Channel Setting Sensing Sensitivity: 0.5 mV
Pulse Gen Serial Number: 7387147

## 2018-06-19 ENCOUNTER — Encounter: Payer: Self-pay | Admitting: Cardiology

## 2018-06-19 NOTE — Progress Notes (Signed)
Remote ICD transmission.   

## 2018-06-24 IMAGING — DX DG CHEST 2V
2 series · 2 of 2 positions shown · non-contrast
Comparison: 01/01/2017.

CLINICAL DATA: Status post implantable defibrillator, history of
CABG, no current symptoms are reported.

EXAM:
CHEST - 2 VIEW

[chest lat]
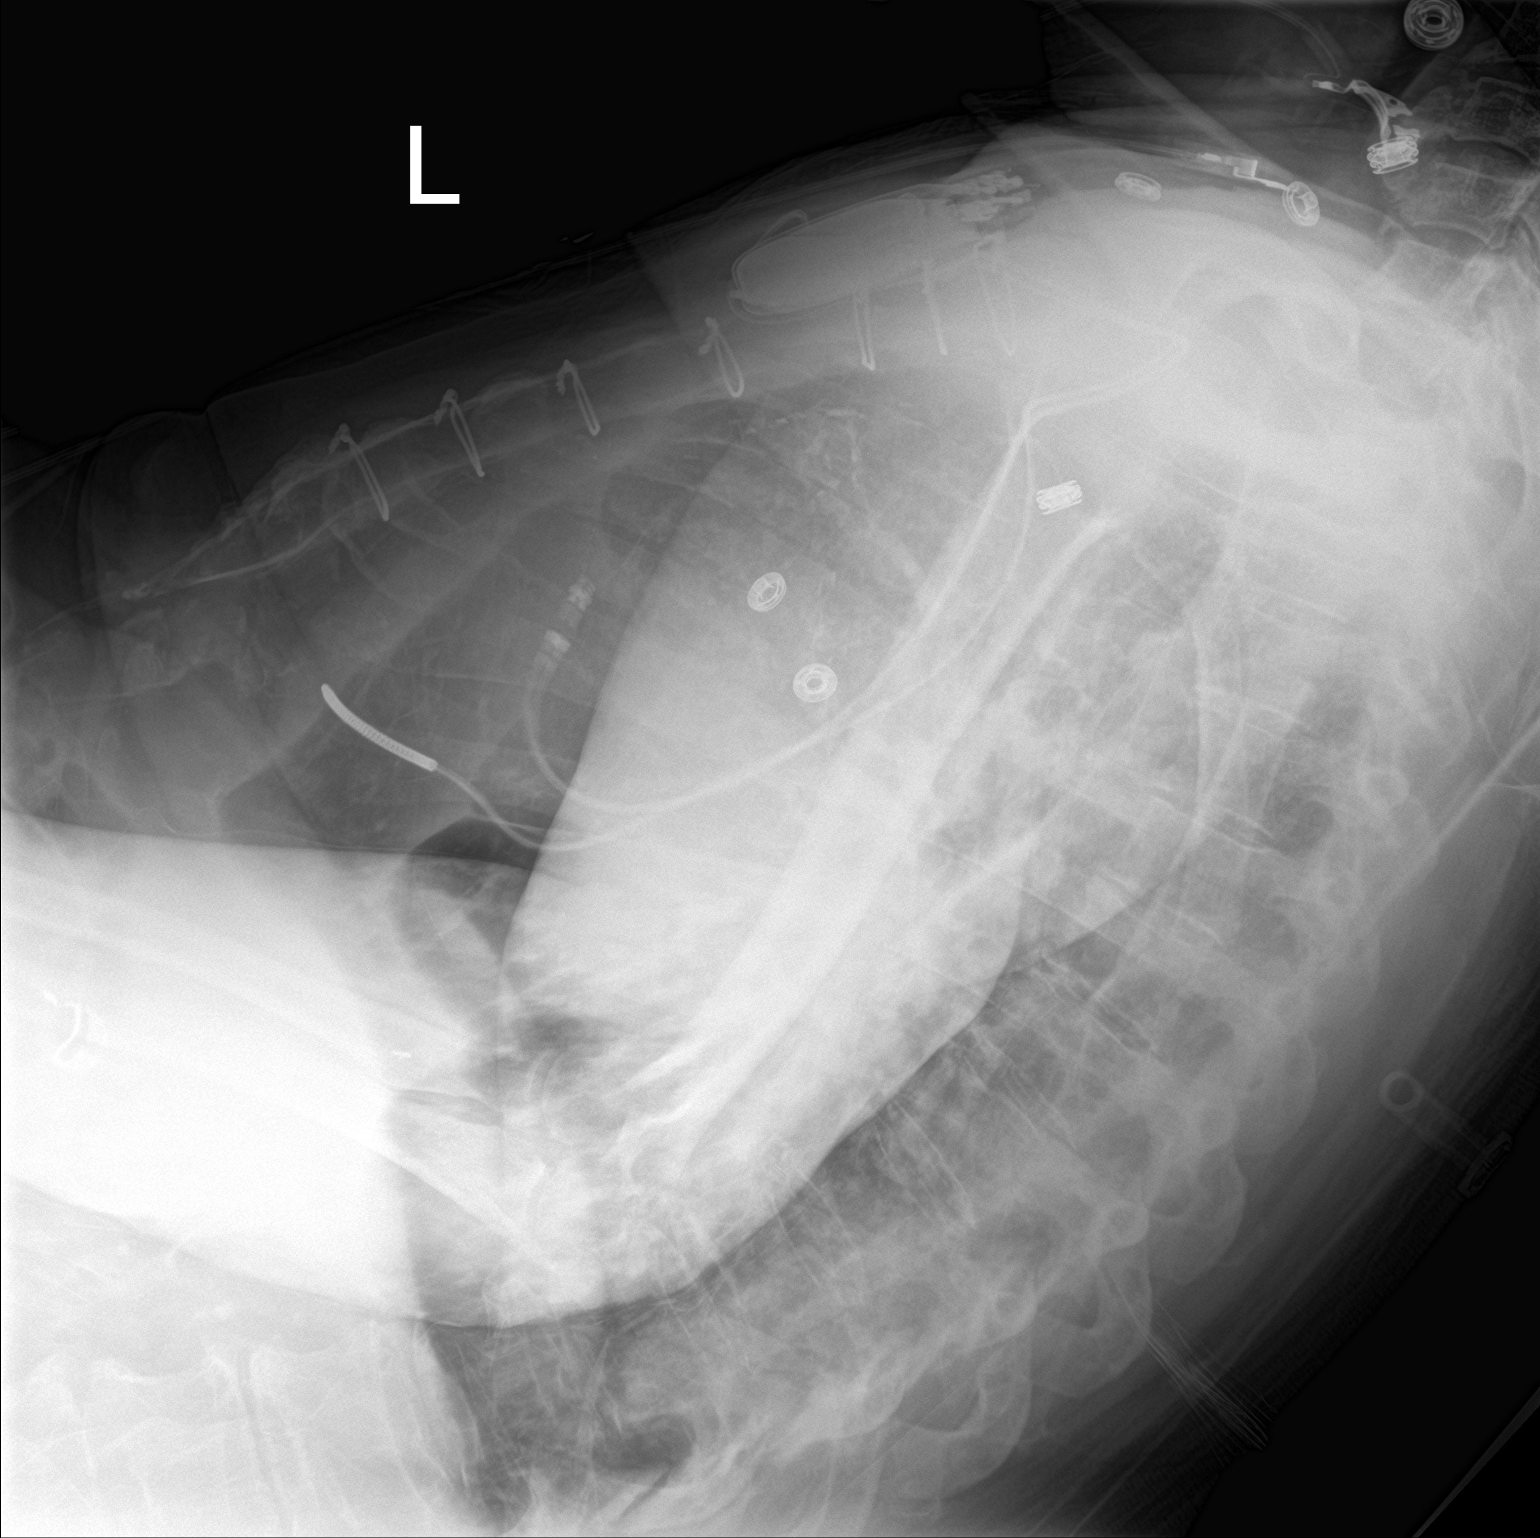

[chest ap]
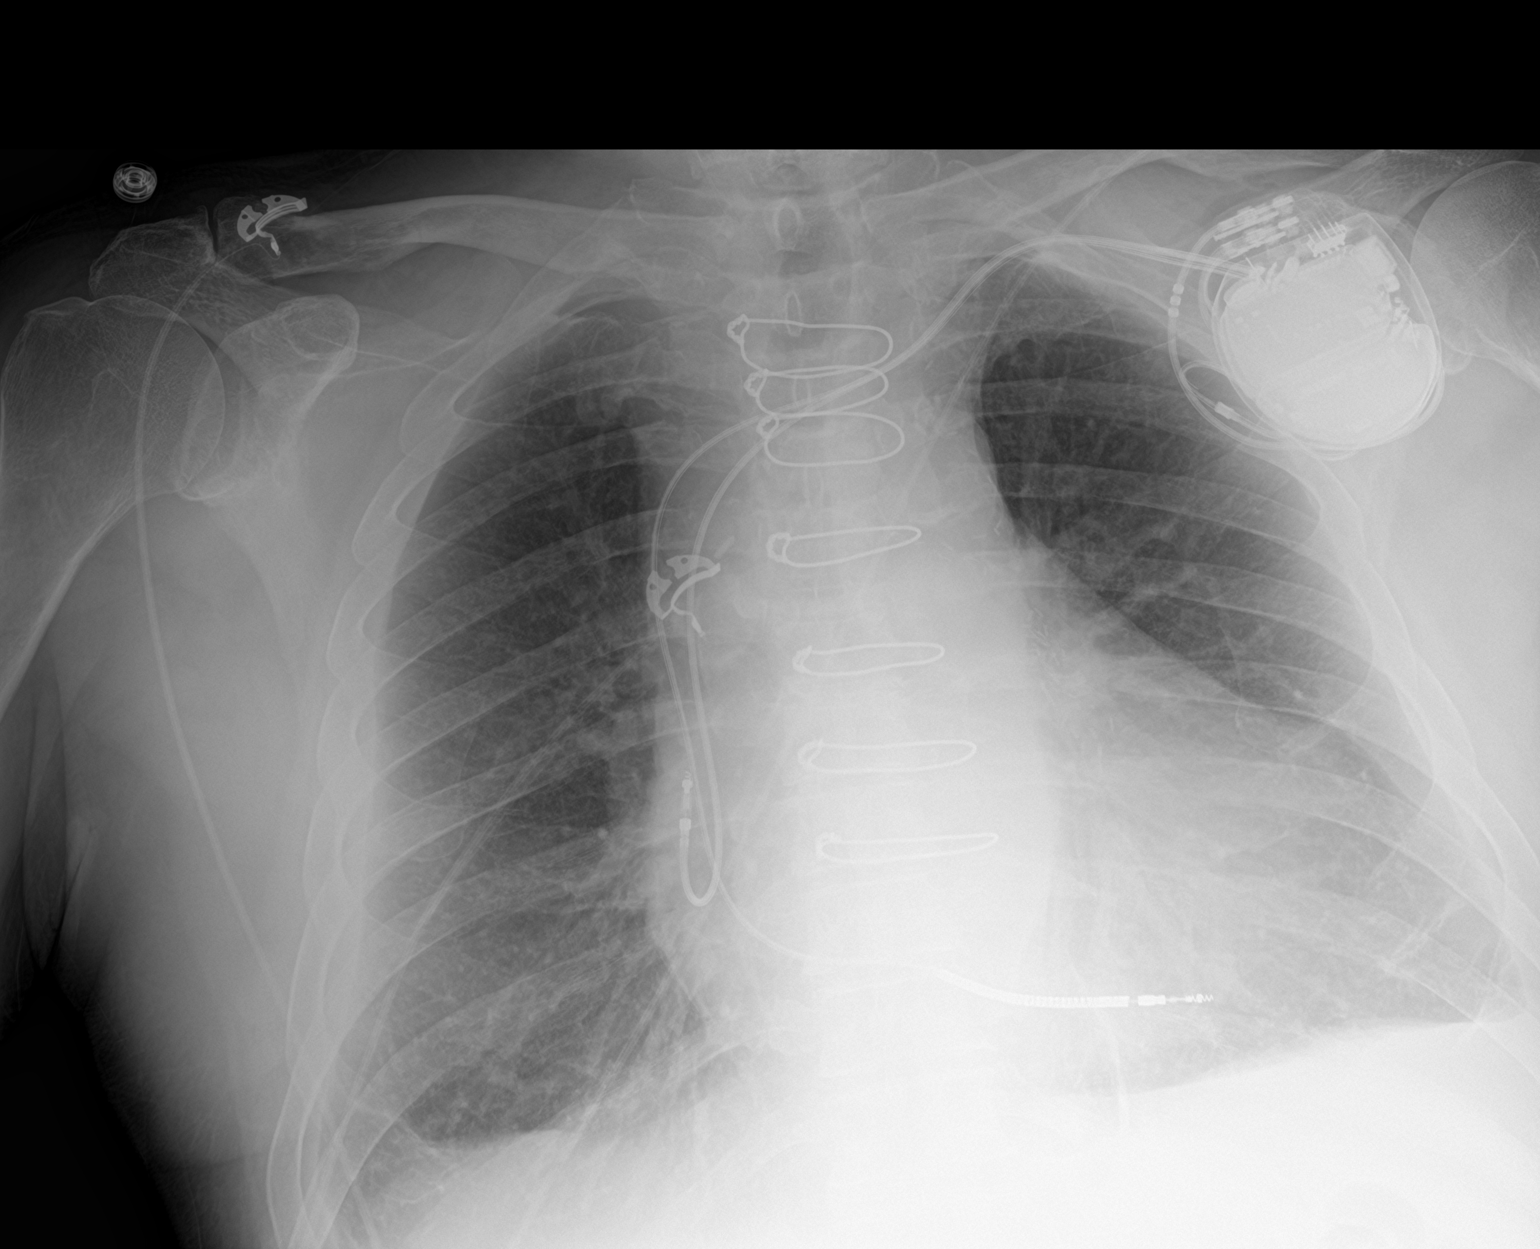

[2 of 2 positions shown; findings below may reference images not displayed]

FINDINGS: The heart size and mediastinal contours are within normal limits.
Both lungs are clear. The visualized skeletal structures are
unremarkable. The heart is enlarged. Defibrillator leads appear
satisfactory position. Prior median sternotomy for CABG. No
consolidation or edema. Chronic blunting both CP angles.
IMPRESSION: Cardiomegaly.  No active cardiopulmonary disease.

## 2018-07-13 ENCOUNTER — Other Ambulatory Visit: Payer: Self-pay | Admitting: Physician Assistant

## 2018-07-27 ENCOUNTER — Other Ambulatory Visit: Payer: Self-pay | Admitting: Cardiology

## 2018-08-01 ENCOUNTER — Other Ambulatory Visit: Payer: Self-pay | Admitting: Cardiology

## 2018-08-26 ENCOUNTER — Other Ambulatory Visit: Payer: Self-pay | Admitting: Cardiology

## 2018-09-06 ENCOUNTER — Other Ambulatory Visit: Payer: Self-pay | Admitting: Cardiology

## 2018-09-10 ENCOUNTER — Ambulatory Visit (INDEPENDENT_AMBULATORY_CARE_PROVIDER_SITE_OTHER): Payer: Medicare Other | Admitting: *Deleted

## 2018-09-10 DIAGNOSIS — I5022 Chronic systolic (congestive) heart failure: Secondary | ICD-10-CM

## 2018-09-10 DIAGNOSIS — I472 Ventricular tachycardia: Secondary | ICD-10-CM

## 2018-09-10 DIAGNOSIS — I4729 Other ventricular tachycardia: Secondary | ICD-10-CM

## 2018-09-10 LAB — CUP PACEART REMOTE DEVICE CHECK
Battery Remaining Longevity: 67 mo
Battery Remaining Percentage: 80 %
Battery Voltage: 2.98 V
Brady Statistic AP VP Percent: 1 %
Brady Statistic AP VS Percent: 13 %
Brady Statistic AS VP Percent: 1 %
Brady Statistic AS VS Percent: 87 %
Brady Statistic RA Percent Paced: 12 %
Brady Statistic RV Percent Paced: 1 %
Date Time Interrogation Session: 20200820060036
HighPow Impedance: 75 Ohm
HighPow Impedance: 75 Ohm
Implantable Lead Implant Date: 20190325
Implantable Lead Implant Date: 20190325
Implantable Lead Location: 753859
Implantable Lead Location: 753860
Implantable Lead Model: 7122
Implantable Pulse Generator Implant Date: 20190325
Lead Channel Impedance Value: 400 Ohm
Lead Channel Impedance Value: 450 Ohm
Lead Channel Sensing Intrinsic Amplitude: 1.6 mV
Lead Channel Sensing Intrinsic Amplitude: 12 mV
Lead Channel Setting Pacing Amplitude: 3.5 V
Lead Channel Setting Pacing Amplitude: 3.5 V
Lead Channel Setting Pacing Pulse Width: 0.5 ms
Lead Channel Setting Sensing Sensitivity: 0.5 mV
Pulse Gen Serial Number: 7387147

## 2018-09-17 NOTE — Progress Notes (Signed)
Remote ICD transmission.   

## 2018-10-04 ENCOUNTER — Other Ambulatory Visit: Payer: Self-pay | Admitting: Physician Assistant

## 2018-10-04 ENCOUNTER — Other Ambulatory Visit: Payer: Self-pay | Admitting: Cardiology

## 2018-11-03 ENCOUNTER — Other Ambulatory Visit: Payer: Self-pay | Admitting: Cardiology

## 2018-11-20 ENCOUNTER — Other Ambulatory Visit: Payer: Self-pay | Admitting: Cardiology

## 2018-12-10 ENCOUNTER — Ambulatory Visit (INDEPENDENT_AMBULATORY_CARE_PROVIDER_SITE_OTHER): Payer: Medicare Other | Admitting: *Deleted

## 2018-12-10 DIAGNOSIS — I5022 Chronic systolic (congestive) heart failure: Secondary | ICD-10-CM | POA: Diagnosis not present

## 2018-12-10 DIAGNOSIS — I255 Ischemic cardiomyopathy: Secondary | ICD-10-CM

## 2018-12-10 LAB — CUP PACEART REMOTE DEVICE CHECK
Battery Remaining Longevity: 64 mo
Battery Remaining Percentage: 78 %
Battery Voltage: 2.98 V
Brady Statistic AP VP Percent: 1 %
Brady Statistic AP VS Percent: 12 %
Brady Statistic AS VP Percent: 1 %
Brady Statistic AS VS Percent: 88 %
Brady Statistic RA Percent Paced: 11 %
Brady Statistic RV Percent Paced: 1 %
Date Time Interrogation Session: 20201119020016
HighPow Impedance: 65 Ohm
HighPow Impedance: 65 Ohm
Implantable Lead Implant Date: 20190325
Implantable Lead Implant Date: 20190325
Implantable Lead Location: 753859
Implantable Lead Location: 753860
Implantable Lead Model: 7122
Implantable Pulse Generator Implant Date: 20190325
Lead Channel Impedance Value: 380 Ohm
Lead Channel Impedance Value: 440 Ohm
Lead Channel Pacing Threshold Amplitude: 0.75 V
Lead Channel Pacing Threshold Amplitude: 0.75 V
Lead Channel Pacing Threshold Pulse Width: 0.5 ms
Lead Channel Pacing Threshold Pulse Width: 0.5 ms
Lead Channel Sensing Intrinsic Amplitude: 1.6 mV
Lead Channel Sensing Intrinsic Amplitude: 12 mV
Lead Channel Setting Pacing Amplitude: 3.5 V
Lead Channel Setting Pacing Amplitude: 3.5 V
Lead Channel Setting Pacing Pulse Width: 0.5 ms
Lead Channel Setting Sensing Sensitivity: 0.5 mV
Pulse Gen Serial Number: 7387147

## 2018-12-13 ENCOUNTER — Other Ambulatory Visit: Payer: Self-pay | Admitting: Cardiology

## 2019-01-08 NOTE — Progress Notes (Signed)
Remote ICD transmission.   

## 2019-01-12 ENCOUNTER — Other Ambulatory Visit: Payer: Self-pay | Admitting: Cardiology

## 2019-02-02 ENCOUNTER — Other Ambulatory Visit: Payer: Self-pay | Admitting: Cardiology

## 2019-03-11 ENCOUNTER — Ambulatory Visit (INDEPENDENT_AMBULATORY_CARE_PROVIDER_SITE_OTHER): Payer: Medicare Other | Admitting: *Deleted

## 2019-03-11 DIAGNOSIS — I5022 Chronic systolic (congestive) heart failure: Secondary | ICD-10-CM

## 2019-03-11 LAB — CUP PACEART REMOTE DEVICE CHECK
Battery Remaining Longevity: 64 mo
Battery Remaining Percentage: 76 %
Battery Voltage: 2.96 V
Brady Statistic AP VP Percent: 1 %
Brady Statistic AP VS Percent: 11 %
Brady Statistic AS VP Percent: 1 %
Brady Statistic AS VS Percent: 88 %
Brady Statistic RA Percent Paced: 10 %
Brady Statistic RV Percent Paced: 1 %
Date Time Interrogation Session: 20210218020018
HighPow Impedance: 68 Ohm
HighPow Impedance: 68 Ohm
Implantable Lead Implant Date: 20190325
Implantable Lead Implant Date: 20190325
Implantable Lead Location: 753859
Implantable Lead Location: 753860
Implantable Lead Model: 7122
Implantable Pulse Generator Implant Date: 20190325
Lead Channel Impedance Value: 410 Ohm
Lead Channel Impedance Value: 440 Ohm
Lead Channel Pacing Threshold Amplitude: 0.75 V
Lead Channel Pacing Threshold Amplitude: 0.75 V
Lead Channel Pacing Threshold Pulse Width: 0.5 ms
Lead Channel Pacing Threshold Pulse Width: 0.5 ms
Lead Channel Sensing Intrinsic Amplitude: 1.4 mV
Lead Channel Sensing Intrinsic Amplitude: 12 mV
Lead Channel Setting Pacing Amplitude: 3.5 V
Lead Channel Setting Pacing Amplitude: 3.5 V
Lead Channel Setting Pacing Pulse Width: 0.5 ms
Lead Channel Setting Sensing Sensitivity: 0.5 mV
Pulse Gen Serial Number: 7387147

## 2019-03-11 NOTE — Progress Notes (Signed)
ICD Remote  

## 2019-06-10 ENCOUNTER — Ambulatory Visit (INDEPENDENT_AMBULATORY_CARE_PROVIDER_SITE_OTHER): Payer: Medicare Other | Admitting: *Deleted

## 2019-06-10 DIAGNOSIS — I255 Ischemic cardiomyopathy: Secondary | ICD-10-CM

## 2019-06-10 LAB — CUP PACEART REMOTE DEVICE CHECK
Battery Remaining Longevity: 62 mo
Battery Remaining Percentage: 73 %
Battery Voltage: 2.96 V
Brady Statistic AP VP Percent: 1 %
Brady Statistic AP VS Percent: 10 %
Brady Statistic AS VP Percent: 1 %
Brady Statistic AS VS Percent: 89 %
Brady Statistic RA Percent Paced: 9.3 %
Brady Statistic RV Percent Paced: 1 %
Date Time Interrogation Session: 20210520020034
HighPow Impedance: 65 Ohm
HighPow Impedance: 65 Ohm
Implantable Lead Implant Date: 20190325
Implantable Lead Implant Date: 20190325
Implantable Lead Location: 753859
Implantable Lead Location: 753860
Implantable Lead Model: 7122
Implantable Pulse Generator Implant Date: 20190325
Lead Channel Impedance Value: 400 Ohm
Lead Channel Impedance Value: 480 Ohm
Lead Channel Pacing Threshold Amplitude: 0.75 V
Lead Channel Pacing Threshold Amplitude: 0.75 V
Lead Channel Pacing Threshold Pulse Width: 0.5 ms
Lead Channel Pacing Threshold Pulse Width: 0.5 ms
Lead Channel Sensing Intrinsic Amplitude: 1.4 mV
Lead Channel Sensing Intrinsic Amplitude: 12 mV
Lead Channel Setting Pacing Amplitude: 3.5 V
Lead Channel Setting Pacing Amplitude: 3.5 V
Lead Channel Setting Pacing Pulse Width: 0.5 ms
Lead Channel Setting Sensing Sensitivity: 0.5 mV
Pulse Gen Serial Number: 7387147

## 2019-06-14 NOTE — Progress Notes (Signed)
Remote ICD transmission.   

## 2019-08-02 ENCOUNTER — Other Ambulatory Visit: Payer: Self-pay | Admitting: Cardiology

## 2019-09-09 ENCOUNTER — Ambulatory Visit (INDEPENDENT_AMBULATORY_CARE_PROVIDER_SITE_OTHER): Payer: Medicare Other | Admitting: *Deleted

## 2019-09-09 ENCOUNTER — Other Ambulatory Visit: Payer: Self-pay | Admitting: Cardiology

## 2019-09-09 DIAGNOSIS — I255 Ischemic cardiomyopathy: Secondary | ICD-10-CM

## 2019-09-09 LAB — CUP PACEART REMOTE DEVICE CHECK
Battery Remaining Longevity: 61 mo
Battery Remaining Percentage: 72 %
Battery Voltage: 2.96 V
Brady Statistic AP VP Percent: 1 %
Brady Statistic AP VS Percent: 10 %
Brady Statistic AS VP Percent: 1 %
Brady Statistic AS VS Percent: 90 %
Brady Statistic RA Percent Paced: 8.9 %
Brady Statistic RV Percent Paced: 1 %
Date Time Interrogation Session: 20210819020014
HighPow Impedance: 65 Ohm
HighPow Impedance: 65 Ohm
Implantable Lead Implant Date: 20190325
Implantable Lead Implant Date: 20190325
Implantable Lead Location: 753859
Implantable Lead Location: 753860
Implantable Lead Model: 7122
Implantable Pulse Generator Implant Date: 20190325
Lead Channel Impedance Value: 390 Ohm
Lead Channel Impedance Value: 490 Ohm
Lead Channel Pacing Threshold Amplitude: 0.75 V
Lead Channel Pacing Threshold Amplitude: 0.75 V
Lead Channel Pacing Threshold Pulse Width: 0.5 ms
Lead Channel Pacing Threshold Pulse Width: 0.5 ms
Lead Channel Sensing Intrinsic Amplitude: 0.8 mV
Lead Channel Sensing Intrinsic Amplitude: 12 mV
Lead Channel Setting Pacing Amplitude: 3.5 V
Lead Channel Setting Pacing Amplitude: 3.5 V
Lead Channel Setting Pacing Pulse Width: 0.5 ms
Lead Channel Setting Sensing Sensitivity: 0.5 mV
Pulse Gen Serial Number: 7387147

## 2019-09-10 NOTE — Progress Notes (Signed)
Remote ICD transmission.   

## 2019-10-05 ENCOUNTER — Other Ambulatory Visit: Payer: Self-pay | Admitting: Physician Assistant

## 2019-11-01 ENCOUNTER — Other Ambulatory Visit: Payer: Self-pay | Admitting: Internal Medicine

## 2019-12-09 ENCOUNTER — Ambulatory Visit (INDEPENDENT_AMBULATORY_CARE_PROVIDER_SITE_OTHER): Payer: Medicare Other

## 2019-12-09 DIAGNOSIS — I255 Ischemic cardiomyopathy: Secondary | ICD-10-CM | POA: Diagnosis not present

## 2019-12-09 LAB — CUP PACEART REMOTE DEVICE CHECK
Battery Remaining Longevity: 58 mo
Battery Remaining Percentage: 69 %
Battery Voltage: 2.96 V
Brady Statistic AP VP Percent: 1 %
Brady Statistic AP VS Percent: 9.4 %
Brady Statistic AS VP Percent: 1 %
Brady Statistic AS VS Percent: 90 %
Brady Statistic RA Percent Paced: 8.3 %
Brady Statistic RV Percent Paced: 1 %
Date Time Interrogation Session: 20211118020017
HighPow Impedance: 65 Ohm
HighPow Impedance: 65 Ohm
Implantable Lead Implant Date: 20190325
Implantable Lead Implant Date: 20190325
Implantable Lead Location: 753859
Implantable Lead Location: 753860
Implantable Lead Model: 7122
Implantable Pulse Generator Implant Date: 20190325
Lead Channel Impedance Value: 380 Ohm
Lead Channel Impedance Value: 550 Ohm
Lead Channel Pacing Threshold Amplitude: 0.75 V
Lead Channel Pacing Threshold Amplitude: 0.75 V
Lead Channel Pacing Threshold Pulse Width: 0.5 ms
Lead Channel Pacing Threshold Pulse Width: 0.5 ms
Lead Channel Sensing Intrinsic Amplitude: 1.4 mV
Lead Channel Sensing Intrinsic Amplitude: 12 mV
Lead Channel Setting Pacing Amplitude: 3.5 V
Lead Channel Setting Pacing Amplitude: 3.5 V
Lead Channel Setting Pacing Pulse Width: 0.5 ms
Lead Channel Setting Sensing Sensitivity: 0.5 mV
Pulse Gen Serial Number: 7387147

## 2019-12-10 NOTE — Progress Notes (Signed)
Remote ICD transmission.   

## 2020-01-04 ENCOUNTER — Telehealth: Payer: Self-pay | Admitting: Cardiology

## 2020-01-04 MED ORDER — FUROSEMIDE 40 MG PO TABS: 20.0000 mg | ORAL_TABLET | Freq: Every day | ORAL | 0 refills | Status: DC

## 2020-01-04 MED ORDER — ATORVASTATIN CALCIUM 80 MG PO TABS: 40.0000 mg | ORAL_TABLET | Freq: Every day | ORAL | 0 refills | Status: DC

## 2020-01-04 MED ORDER — LOSARTAN POTASSIUM 100 MG PO TABS: 100.0000 mg | ORAL_TABLET | Freq: Every day | ORAL | 0 refills | Status: DC

## 2020-01-04 MED ORDER — METOPROLOL TARTRATE 25 MG PO TABS: 25.0000 mg | ORAL_TABLET | Freq: Two times a day (BID) | ORAL | 0 refills | Status: DC

## 2020-01-04 NOTE — Telephone Encounter (Signed)
  *  STAT* If patient is at the pharmacy, call can be transferred to refill team.   1. Which medications need to be refilled? (please list name of each medication and dose if known?   atorvastatin (LIPITOR) 80 MG tablet    furosemide (LASIX) 40 MG tablet    losartan (COZAAR) 100 MG tablet    metoprolol tartrate (LOPRESSOR) 25 MG tablet    2. Which pharmacy/location (including street and city if local pharmacy) is medication to be sent to? [facility] - [place], [place] - [address]  3. Do they need a 30 day or 90 day supply? 90 days  Pt been out of meds since last month

## 2020-01-05 ENCOUNTER — Other Ambulatory Visit: Payer: Self-pay | Admitting: Internal Medicine

## 2020-01-05 ENCOUNTER — Other Ambulatory Visit: Payer: Self-pay | Admitting: Cardiology

## 2020-01-05 NOTE — Telephone Encounter (Signed)
Rx has been sent to the pharmacy electronically. ° °

## 2020-01-24 ENCOUNTER — Encounter: Payer: Self-pay | Admitting: Physician Assistant

## 2020-01-24 ENCOUNTER — Telehealth (INDEPENDENT_AMBULATORY_CARE_PROVIDER_SITE_OTHER): Payer: Medicare Other | Admitting: Physician Assistant

## 2020-01-24 ENCOUNTER — Telehealth: Payer: Self-pay

## 2020-01-24 DIAGNOSIS — I5022 Chronic systolic (congestive) heart failure: Secondary | ICD-10-CM

## 2020-01-24 DIAGNOSIS — I255 Ischemic cardiomyopathy: Secondary | ICD-10-CM | POA: Diagnosis not present

## 2020-01-24 DIAGNOSIS — I2510 Atherosclerotic heart disease of native coronary artery without angina pectoris: Secondary | ICD-10-CM

## 2020-01-24 DIAGNOSIS — E785 Hyperlipidemia, unspecified: Secondary | ICD-10-CM

## 2020-01-24 DIAGNOSIS — E119 Type 2 diabetes mellitus without complications: Secondary | ICD-10-CM

## 2020-01-24 DIAGNOSIS — I10 Essential (primary) hypertension: Secondary | ICD-10-CM

## 2020-01-24 DIAGNOSIS — R7303 Prediabetes: Secondary | ICD-10-CM

## 2020-01-24 DIAGNOSIS — R5383 Other fatigue: Secondary | ICD-10-CM

## 2020-01-24 NOTE — Telephone Encounter (Signed)
  Patient Consent for Virtual Visit         Luis Donaldson has provided verbal consent on 01/24/2020 for a virtual visit (video or telephone).   CONSENT FOR VIRTUAL VISIT FOR:  Luis Donaldson  By participating in this virtual visit I agree to the following:  I hereby voluntarily request, consent and authorize [facility] and its employed or contracted physicians, physician assistants, nurse practitioners or other licensed health care professionals (the Practitioner), to provide me with telemedicine health care services (the "Services") as deemed necessary by the treating Practitioner. I acknowledge and consent to receive the Services by the Practitioner via telemedicine. I understand that the telemedicine visit will involve communicating with the Practitioner through live audiovisual communication technology and the disclosure of certain medical information by electronic transmission. I acknowledge that I have been given the opportunity to request an in-person assessment or other available alternative prior to the telemedicine visit and am voluntarily participating in the telemedicine visit.  I understand that I have the right to withhold or withdraw my consent to the use of telemedicine in the course of my care at any time, without affecting my right to future care or treatment, and that the Practitioner or I may terminate the telemedicine visit at any time. I understand that I have the right to inspect all information obtained and/or recorded in the course of the telemedicine visit and may receive copies of available information for a reasonable fee.  I understand that some of the potential risks of receiving the Services via telemedicine include:  Marland Kitchen Delay or interruption in medical evaluation due to technological equipment failure or disruption; . Information transmitted may not be sufficient (e.g. poor resolution of images) to allow for appropriate medical decision making by the Practitioner; and/or   . In rare instances, security protocols could fail, causing a breach of personal health information.  Furthermore, I acknowledge that it is my responsibility to provide information about my medical history, conditions and care that is complete and accurate to the best of my ability. I acknowledge that Practitioner's advice, recommendations, and/or decision may be based on factors not within their control, such as incomplete or inaccurate data provided by me or distortions of diagnostic images or specimens that may result from electronic transmissions. I understand that the practice of medicine is not an exact science and that Practitioner makes no warranties or guarantees regarding treatment outcomes. I acknowledge that a copy of this consent can be made available to me via my patient portal [facility] MyChart), or I can request a printed copy by calling the office of [facility].    I understand that my insurance will be billed for this visit.   I have read or had this consent read to me. . I understand the contents of this consent, which adequately explains the benefits and risks of the Services being provided via telemedicine.  . I have been provided ample opportunity to ask questions regarding this consent and the Services and have had my questions answered to my satisfaction. . I give my informed consent for the services to be provided through the use of telemedicine in my medical care

## 2020-01-24 NOTE — Progress Notes (Signed)
Virtual Visit via Telephone Note   This visit type was conducted due to national recommendations for restrictions regarding the COVID-19 Pandemic (e.g. social distancing) in an effort to limit this patient's exposure and mitigate transmission in our community.  Due to his co-morbid illnesses, this patient is at least at moderate risk for complications without adequate follow up.  This format is felt to be most appropriate for this patient at this time.  The patient did not have access to video technology/had technical difficulties with video requiring transitioning to audio format only (telephone).  All issues noted in this document were discussed and addressed.  No physical exam could be performed with this format.  Please refer to the patient's chart for his  consent to telehealth for [facility]    Date:  01/26/2020   ID:  Luis Donaldson, DOB May 09, 1945, MRN [id number] The patient was identified using 2 identifiers.  Patient Location: Home Provider Location: Office/Clinic  PCP:  Ladell Pier, MD  Cardiologist:  Peter Martinique, MD  Electrophysiologist:  None   Evaluation Performed:  Follow-Up Visit  Chief Complaint:  Follow up  History of Present Illness:    Luis Donaldson is a 75 y.o. male with PMH of CAD, ischemic cardiomyopathy s/p ICD,  HTN, HLD and DM II.  He had a remote PCI in the 90s and then CABG in 2000 at [facility].  He went back to the hospital in November 2018 with chest pain, respiratory failure, hypertensive emergency and persistent right of SVT.  There was some concern of STEMI with ST elevation in V1-V2.  He was intubated and underwent emergent cardiac catheterization which did not reveal any culprit lesion.  It showed 2 patent grafts with chronic RCA graft occlusion with collaterals, EF 25 to 30% with grade 1 DD.  Medical therapy was recommended.  Patient was readmitted in December 2018 with bradycardia, frequent urination and repetitive runs of SVT and was started  on amiodarone. Amlodipine was added due to elevated blood pressure.  Repeat echocardiogram in March 2019 showed EF 30 to 35%, he was referred to EP for ICD implantation.  He eventually underwent ICD implantation by Dr. Lovena Le on 04/14/2017.  He was last seen by Dr. Martinique in April 2019 at which time he was doing well.  Amiodarone was stopped due to elevated TSH and liver function.  He does have a history of allergic reaction with hydralazine.  Since the last visit, he has been diagnosed with diabetes.  Patient presents today for virtual visit.  He says he has been quite secluded since the start of pandemic.  He does not do much activity at all and mainly sit in the chair.  He denies any chest pain, lower extremity edema or worsening shortness of breath but does mention that his functional ability is quite limited.  He does get short of breath with more strenuous activity.  He does not have a blood pressure cuff and cannot check his blood pressure.  He has not seen his primary care provider in at least 4 years.  I did urge him to increase activity level and see his primary care provider more often.  Otherwise he will need CBC, CMP, TSH, hemoglobin A1c, and fasting lipid panel.  Ideally he will also need a nursing visit with blood pressure check as well or at least a blood pressure cuff at home.  If his systolic blood pressure remains above 140 mmHg, he will need a earlier follow-up.  Otherwise  we will see him in 6 months.  He does mention one of his neighbor had Covid recently.  He never had covid caccine, when asked about the reason he says he is on the wait and see mode.  I did encourage him to get the Covid vaccine.  The patient does not have symptoms concerning for COVID-19 infection (fever, chills, cough, or new shortness of breath).    Past Medical History:  Diagnosis Date  . Coronary artery disease   . Hyperlipidemia   . Hypertension   . MI (myocardial infarction) [facility])    Past Surgical History:   Procedure Laterality Date  . CORONARY ARTERY BYPASS GRAFT    . ICD IMPLANT N/A 04/14/2017   Procedure: ICD IMPLANT;  Surgeon: Evans Lance, MD;  Location: [place] [facility];  Service: Cardiovascular;  Laterality: N/A;  . LEFT HEART CATH AND CORONARY ANGIOGRAPHY N/A 12/16/2016   Procedure: LEFT HEART CATH AND CORONARY ANGIOGRAPHY;  Surgeon: Martinique, Peter M, MD;  Location: [place] [facility];  Service: Cardiovascular;  Laterality: N/A;     Current Meds  Medication Sig  . aspirin EC 81 MG EC tablet Take 1 tablet (81 mg total) by mouth daily.  Marland Kitchen atorvastatin (LIPITOR) 80 MG tablet Take 0.5 tablets (40 mg total) by mouth daily.  . furosemide (LASIX) 40 MG tablet Take 0.5 tablets (20 mg total) by mouth daily.  Marland Kitchen losartan (COZAAR) 100 MG tablet Take 1 tablet (100 mg total) by mouth daily. KEEP JAN. APPT.  . metoprolol tartrate (LOPRESSOR) 25 MG tablet Take 1 tablet (25 mg total) by mouth 2 (two) times daily. Please make overdue appt with Dr. Lovena Le before anymore refills. 2nd attempt  . Multiple Vitamin (MULTIVITAMIN) tablet Take 1 tablet by mouth daily.     Allergies:   Hydralazine hcl   Social History   Tobacco Use  . Smoking status: Former Smoker    Types: Cigarettes    Quit date: 12/11/2016    Years since quitting: 3.1  . Smokeless tobacco: Never Used  Vaping Use  . Vaping Use: Never used  Substance Use Topics  . Alcohol use: No  . Drug use: No     Family Hx: The patient's family history includes Heart disease in his mother.  ROS:   Please see the history of present illness.     All other systems reviewed and are negative.   Prior CV studies:   The following studies were reviewed today:  Echo 03/26/2017 LV EF: 30% -  35%   -------------------------------------------------------------------  Indications:   CHF (I50.22).   -------------------------------------------------------------------  History:  PMH:  Coronary artery disease. Congestive heart  failure.  Ischemic cardiomyopathy. PMH:  Myocardial infarction.  Risk factors: SVT. Former tobacco use. Hypertension. Diabetes  mellitus. Dyslipidemia.   -------------------------------------------------------------------  Study Conclusions   - Procedure narrative: Transthoracic echocardiography. Image  quality was suboptimal. The study was technically difficult.  Intravenous contrast (Definity) was administered.  - Left ventricle: There is significant swirling of definity  contrast in the apex c/w sluggish flow. Cannot rule out early  forming LV apical thrombus. The cavity size was severely dilated.  Systolic function was moderately to severely reduced. The  estimated ejection fraction was in the range of 30% to 35%. There  is akinesis of the apical anterior, apical septal, apical  inferior, mid anteroseptal and apical myocardium. There is  akinesis of the basalinferior myocardium. There was a reduced  contribution of atrial contraction to ventricular filling, due to  increased ventricular diastolic pressure or atrial contractile  dysfunction. Doppler parameters are consistent with a reversible  restrictive pattern, indicative of decreased left ventricular  diastolic compliance and/or increased left atrial pressure (grade  3 diastolic dysfunction). Doppler parameters are consistent with  high ventricular filling pressure.  - Aortic valve: Trileaflet; mildly thickened, moderately calcified  leaflets.  - Aorta: Aortic root dimension: 38 mm (ED). Ascending aortic  diameter: 38 mm (S).  - Aortic root: The aortic root was mildly dilated.  - Ascending aorta: The ascending aorta was mildly dilated.  - Left atrium: The atrium was moderately dilated.  - Right ventricle: The cavity size was moderately dilated. Wall  thickness was normal. Systolic function was mildly reduced.   Labs/Other Tests and Data Reviewed:    EKG:  An ECG dated 04/15/2017 was  personally reviewed today and demonstrated:  Normal sinus rhythm with poor R wave progression in the anterior leads.  Recent Labs: No results found for requested labs within last 8760 hours.   Recent Lipid Panel Lab Results  Component Value Date/Time   CHOL 133 03/05/2017 09:48 AM   TRIG 140 03/05/2017 09:48 AM   HDL 35 (L) 03/05/2017 09:48 AM   CHOLHDL 6.5 12/17/2016 06:29 AM   LDLCALC 70 03/05/2017 09:48 AM    Wt Readings from Last 3 Encounters:  05/07/17 207 lb 12.8 oz (94.3 kg)  04/15/17 204 lb (92.5 kg)  04/07/17 205 lb (93 kg)     Risk Assessment/Calculations:      Objective:    Vital Signs:  There were no vitals taken for this visit.   VITAL SIGNS:  reviewed  ASSESSMENT & PLAN:    1. CAD: History of CABG. denies any recent chest discomfort.  2. Hypertension: He has a history of uncontrolled blood pressure.  He does not follow-up with his primary care provider regularly.  Unfortunately he is not aware of the current blood pressure.  Ideally, he will need a nursing visit with blood pressure check or at least a blood pressure cuff at home.  We will reach out to our social worker to see if we can supply him with a blood pressure cuff.  He is aware to contact cardiology if his systolic blood pressures persistently above 140 mmHg.  3. Chronic systolic heart failure: Denies any significant lower extremity edema, orthopnea or PND  4. Ischemic cardiomyopathy s/p ICD: Followed by electrophysiology service  5. Fatigue: will need strict blood pressure control.  Obtain annual lab work.  6. DM2: We will need to follow-up with primary care provider.  Obtain hemoglobin A1c        COVID-19 Education: The signs and symptoms of COVID-19 were discussed with the patient and how to seek care for testing (follow up with PCP or arrange E-visit).  The importance of social distancing was discussed today.  Time:   Today, I have spent 10 minutes with the patient with telehealth  technology discussing the above problems.     Medication Adjustments/Labs and Tests Ordered: Current medicines are reviewed at length with the patient today.  Concerns regarding medicines are outlined above.   Tests Ordered: Orders Placed This Encounter  Procedures  . CBC  . Comprehensive metabolic panel  . Hemoglobin A1c  . Lipid panel  . TSH    Medication Changes: No orders of the defined types were placed in this encounter.   Follow Up:  In Person in 6 month(s)  Ramond Dial, [place]  01/26/2020 9:31 PM  [facility]

## 2020-01-24 NOTE — Patient Instructions (Addendum)
Other Instructions  Monitor blood pressure at home. If possible if you do not own a blood pressure machine at home you can go to a local pharmacy and have blood pressure checked until blood pressure machine is purchased for home use    Medication Instructions:  Your physician recommends that you continue on your current medications as directed. Please refer to the Current Medication list given to you today.  *If you need a refill on your cardiac medications before your next appointment, please call your pharmacy*  Lab Work: Your physician recommends that you return for lab work AT YOUR EARLIEST CONVENIENCE:  CBC  CMET  Fasting Lipid Panel-DO NOT EAT OR DRINK PAST MIDNIGHT. OKAY TO HAVE WATER  HgbA1c  TSH If you have labs (blood work) drawn today and your tests are completely normal, you will receive your results only by: Marland Kitchen MyChart Message (if you have MyChart) OR . A paper copy in the mail If you have any lab test that is abnormal or we need to change your treatment, we will call you to review the results.  Testing/Procedures: NONE ordered at this time of appointment   Follow-Up: At [facility], you and your health needs are our priority.  As part of our continuing mission to provide you with exceptional heart care, we have created designated Provider Care Teams.  These Care Teams include your primary Cardiologist (physician) and Advanced Practice Providers (APPs -  Physician Assistants and Nurse Practitioners) who all work together to provide you with the care you need, when you need it.  We recommend signing up for the patient portal called "MyChart".  Sign up information is provided on this After Visit Summary.  MyChart is used to connect with patients for Virtual Visits (Telemedicine).  Patients are able to view lab/test results, encounter notes, upcoming appointments, etc.  Non-urgent messages can be sent to your provider as well.   To learn more about what you can do with  MyChart, go to [email or web address].    Your next appointment:   6 month(s)  The format for your next appointment:   In Person  Provider:   Peter [place], MD

## 2020-01-25 ENCOUNTER — Telehealth: Payer: Self-pay | Admitting: Licensed Clinical Social Worker

## 2020-01-25 NOTE — Progress Notes (Signed)
Heart and Vascular Care Navigation  01/25/2020  Luis Donaldson 05/01/1945 [id number]  Reason for Referral:  Blood pressure cuff and home assistance referral.                                                                                                     Assessment:            CSW received a referral from [place], [place], and [place], [place]. Pt has not seen a regular PCP over the past few years and does not often leave the house. I introduced self, role, reason for call. Pt confirmed home address and emergency contacts. I inquired if pt had seen Dr. Wynetta Emery at [facility] recently, pt shares he has not and declined for me to make an appointment for him. He drives and will go to grocery store and other places but does not like to venture out due to [place] pandemic. I explained that my referral noted that pt needed a blood pressure cuff. He shares that he is interested and is okay with this being sent to his home. Pt also interested in some home care services if possible to help with his ongoing chronic care needs (CHF, diabetes etc). I explained [facility] program and he is okay with [place] making the referral. I let him know that if he is not interested after speaking with [facility] that he can say no and we can arrange alternate assistance to ensure labs are completed. Pt states understanding.                            HRT/VAS Care Coordination    Patients Home Cardiology Office [place] arrangements for the past 2 months Single Family Home   Lives with: Self   Patient Current Librarian, academic; Traditional Medicare   Patient Has Concern With Paying Medical Bills No   Does Patient Have Prescription Coverage? Yes   Home Assistive Devices/Equipment None      Social History:                                                                             SDOH Screenings   Alcohol Screen: [name]  Depression [id number]): [name]  Financial Resource Strain: Medium  Risk  . Difficulty of Paying Living Expenses: Somewhat hard  Food Insecurity: [name]  Housing: [name]  Physical Activity: [name]  Social Connections: [name]  Stress: [name]  Tobacco Use: Medium Risk  . Smoking Tobacco Use: Former Smoker  . Smokeless Tobacco Use: Never Used  Transportation Needs: No Transportation Needs  . Lack of Transportation (Medical): No  . Lack of Transportation (Non-Medical): No  SDOH Interventions: Financial Resources:  Financial Strain Interventions: Other (Comment) (we discussed programs and transportation assistance that he may be eligible for)   Transportation:   Transportation Interventions: [employer]    Follow-up plan:   CSW will send referral to [place], Care Navigation team lead and LCSW to send cuff to pt home. I also made a referral to Cindra Eves w/ [facility] to assess pt eligibility. CSW will f/u later this week to ensure referrals have been completed. CSW will send my information to pt via mail as discussed so he can call if any additional questions/concerns arise.

## 2020-01-26 ENCOUNTER — Telehealth: Payer: Self-pay | Admitting: Licensed Clinical Social Worker

## 2020-01-26 NOTE — Telephone Encounter (Signed)
Confirmed w/ Cindra Eves, CSW, at [facility] that they can see patient if he is agreeable to them at this time. She will f/u on referral and let this writer know if any additional needs arise.   Octavio Graves, MSW, LCSW [facility] Heart/Vascular Care Navigation  [phone]

## 2020-01-26 NOTE — Telephone Encounter (Signed)
CSW referred to assist patient with obtaining a BP cuff. CSW ordered via [employer] and will be delivered to home. Lasandra Beech, LCSW, CCSW-MCS [phone]

## 2020-02-04 ENCOUNTER — Telehealth: Payer: Self-pay | Admitting: Licensed Clinical Social Worker

## 2020-02-04 NOTE — Telephone Encounter (Signed)
[facility] has accepted the referral yet been unable to schedule a first appointment w/ pt. CSW called pt this morning to see if I was able to reach him at [phone]. No answer, unable to leave voicemail as pt does not have it set up on current phone.  Pt then called this writer back- he is still interested in [facility]'s services but is having issues w/ his roof and his homeowners insurance. I let him know [facility] should be able to meet with him in another area of the home and to let Cecille Rubin know that he's still interested in their services.   Pt states understanding, has my number should any additional needs arise.  [name], MSW, [place]  [phone]

## 2020-02-09 ENCOUNTER — Telehealth: Payer: Self-pay | Admitting: Licensed Clinical Social Worker

## 2020-02-09 NOTE — Telephone Encounter (Signed)
CSW was finally able to assist [facility] to connect with the Luis Donaldson. They have set up an appointment for Luis Donaldson next week to have a nurse come and see him for an initial assessment.[name], MSW, [place]  [phone]

## 2020-02-11 ENCOUNTER — Telehealth: Payer: Self-pay | Admitting: Cardiology

## 2020-02-11 MED ORDER — VALSARTAN 160 MG PO TABS: 160.0000 mg | ORAL_TABLET | Freq: Every day | ORAL | 3 refills | Status: DC

## 2020-02-11 NOTE — Telephone Encounter (Signed)
Pt c/o medication issue:  1. Name of Medication: losartan (COZAAR) 100 MG tablet  2. How are you currently taking this medication (dosage and times per day)? 1 tablet daily  3. Are you having a reaction (difficulty breathing--STAT)? no  4. What is your medication issue? Dorothea Ogle from [place] states all losartans are on back order. He states the patient is out of medication and they would like to know if an alternative can be sent in. He states valsartan is a common alternative, but it is the doctors choice. He states they do not need a call back. l

## 2020-02-11 NOTE — Telephone Encounter (Signed)
We can substitute Luis Donaldson 160 mg daily  [name] Martinique MD, [facility]

## 2020-02-11 NOTE — Telephone Encounter (Signed)
Spoke to patient Dr.Jordan advised ok to take Valsartan 160 mg daily to replace Losartan.

## 2020-02-15 ENCOUNTER — Other Ambulatory Visit: Payer: Self-pay | Admitting: Physician Assistant

## 2020-02-17 ENCOUNTER — Telehealth: Payer: Self-pay | Admitting: Licensed Clinical Social Worker

## 2020-02-17 NOTE — Telephone Encounter (Signed)
Was able to confirm with Reche Dixon, CSW with [facility] that they were able to meet with pt yesterday and enroll him in services. Pt Losartan is on back order, they are aware that Valsartan was ordered and will make sure pt collects that new prescription from the pharmacy. During his appointment they took his blood pressure, drew labs, provided a scale (pt already has a BP cuff provided by Care Navigation team and has been using it) and will continue to provide ongoing support and education. CSW grateful for update, Cecille Rubin will have her team send the updated labs and information from appointment to [facility] for pt providers to review as needed.   [name], MSW, [place]  [phone]

## 2020-03-09 ENCOUNTER — Ambulatory Visit (INDEPENDENT_AMBULATORY_CARE_PROVIDER_SITE_OTHER): Payer: Medicare Other

## 2020-03-09 DIAGNOSIS — I255 Ischemic cardiomyopathy: Secondary | ICD-10-CM | POA: Diagnosis not present

## 2020-03-09 LAB — CUP PACEART REMOTE DEVICE CHECK
Battery Remaining Longevity: 57 mo
Battery Remaining Percentage: 67 %
Battery Voltage: 2.95 V
Brady Statistic AP VP Percent: 1 %
Brady Statistic AP VS Percent: 9 %
Brady Statistic AS VP Percent: 1 %
Brady Statistic AS VS Percent: 90 %
Brady Statistic RA Percent Paced: 7.9 %
Brady Statistic RV Percent Paced: 1 %
Date Time Interrogation Session: 20220217020021
HighPow Impedance: 61 Ohm
HighPow Impedance: 61 Ohm
Implantable Lead Implant Date: 20190325
Implantable Lead Implant Date: 20190325
Implantable Lead Location: 753859
Implantable Lead Location: 753860
Implantable Lead Model: 7122
Implantable Pulse Generator Implant Date: 20190325
Lead Channel Impedance Value: 380 Ohm
Lead Channel Impedance Value: 540 Ohm
Lead Channel Pacing Threshold Amplitude: 0.75 V
Lead Channel Pacing Threshold Amplitude: 0.75 V
Lead Channel Pacing Threshold Pulse Width: 0.5 ms
Lead Channel Pacing Threshold Pulse Width: 0.5 ms
Lead Channel Sensing Intrinsic Amplitude: 1 mV
Lead Channel Sensing Intrinsic Amplitude: 12 mV
Lead Channel Setting Pacing Amplitude: 3.5 V
Lead Channel Setting Pacing Amplitude: 3.5 V
Lead Channel Setting Pacing Pulse Width: 0.5 ms
Lead Channel Setting Sensing Sensitivity: 0.5 mV
Pulse Gen Serial Number: 7387147

## 2020-03-16 NOTE — Progress Notes (Signed)
Remote ICD transmission.   

## 2020-03-29 ENCOUNTER — Other Ambulatory Visit: Payer: Self-pay | Admitting: Physician Assistant

## 2020-04-01 ENCOUNTER — Other Ambulatory Visit: Payer: Self-pay | Admitting: Cardiology

## 2020-04-08 ENCOUNTER — Other Ambulatory Visit: Payer: Self-pay | Admitting: Physician Assistant

## 2020-04-13 ENCOUNTER — Other Ambulatory Visit: Payer: Self-pay | Admitting: Physician Assistant

## 2020-04-14 ENCOUNTER — Other Ambulatory Visit: Payer: Self-pay | Admitting: Physician Assistant

## 2020-06-27 ENCOUNTER — Other Ambulatory Visit: Payer: Self-pay | Admitting: Cardiology

## 2020-06-27 MED ORDER — FUROSEMIDE 40 MG PO TABS: 40.0000 mg | ORAL_TABLET | Freq: Every day | ORAL | 3 refills | Status: DC

## 2020-08-07 ENCOUNTER — Telehealth: Payer: Self-pay | Admitting: Cardiology

## 2020-08-07 NOTE — Telephone Encounter (Signed)
Pt is returning call from 2 hours ago, no message left on answering service, pt is not sure who called. Please advise pt further

## 2020-08-07 NOTE — Telephone Encounter (Signed)
Returned call to patient, advised patient that per chart review I did not see where anyone had documented that they had tried to call patient. Advised patient if there are any issues to call the office. Patient verbalized understanding.

## 2021-03-13 ENCOUNTER — Telehealth: Payer: Self-pay | Admitting: Cardiology

## 2021-03-13 NOTE — Telephone Encounter (Signed)
Looks like his last visit with Isaac Laud was virtual. OK to schedule it that way. Ideally would like him to check vitals for visit with BP and HR.   Jerid Catherman Martinique MD, [facility]

## 2021-03-13 NOTE — Telephone Encounter (Signed)
Patient states he is not in great shape and insists he is unable to come into the office. He would like to know if he can convert 3/21 appointment with Dr. Martinique to a virtual. Please advise.

## 2021-03-13 NOTE — Telephone Encounter (Signed)
Spoke with patient who requests his next appointment with Dr. Martinique to be virtual. He stated he is scared to go out because he does not want to contract COVID. I explained to patient that it has been over a year since he was physically evaluated; that he needs an EKG and be physically evaluated by Dr. Martinique or APP. I also explained that we wear masks and take precautions in the clinic. Please advise.

## 2021-03-14 NOTE — Telephone Encounter (Signed)
Called patient left message on personal voice mail visit with Dr.Jordan 3/21 at 3:20 pm changed to a virtual visit.

## 2021-03-26 ENCOUNTER — Telehealth: Payer: Self-pay

## 2021-03-26 NOTE — Telephone Encounter (Signed)
Patient called in stating that his monitor is not working. Patient spoke with tech support and they were trying to get it paired but was not successful. Patient needs to come in for RF telemtry but refuses to come in the office because of his age and covid and he does not want to risk it. Patient states if it cant be done over the phone then he wont do it. I let patient know I will call rep to see if they can reach out and help the patient  ?

## 2021-03-29 ENCOUNTER — Other Ambulatory Visit: Payer: Self-pay | Admitting: Cardiology

## 2021-03-29 ENCOUNTER — Other Ambulatory Visit: Payer: Self-pay | Admitting: Physician Assistant

## 2021-03-30 NOTE — Telephone Encounter (Signed)
OK to refill ? ?Willis Kuipers Martinique MD, [facility] ? ?

## 2021-03-30 NOTE — Telephone Encounter (Signed)
OK to refill ? ?Jhase Creppel Martinique MD, [facility] ? ?

## 2021-04-05 NOTE — Progress Notes (Signed)
? ?Virtual Visit via Telephone Note  ? ?This visit type was conducted due to national recommendations for restrictions regarding the COVID-19 Pandemic (e.g. social distancing) in an effort to limit this patient's exposure and mitigate transmission in our community.  Due to his co-morbid illnesses, this patient is at least at moderate risk for complications without adequate follow up.  This format is felt to be most appropriate for this patient at this time.  The patient did not have access to video technology/had technical difficulties with video requiring transitioning to audio format only (telephone).  All issues noted in this document were discussed and addressed.  No physical exam could be performed with this format.  Please refer to the patient's chart for his  consent to telehealth for [facility].  ? ? ?Date:  04/10/2021  ? ?ID:  Luis Donaldson, DOB 04-Jun-1945, MRN [id number] ?The patient was identified using 2 identifiers. ? ?Patient Location: Home ?Provider Location: Office/Clinic ? ?PCP:  Ladell Pier, MD  ?Cardiologist:  Latronda Spink Martinique, MD  ?Electrophysiologist:  None  ? ?Evaluation Performed:  Follow-Up Visit ? ?Chief Complaint:  Follow up ? ?History of Present Illness:   ? ?Luis Donaldson is a 76 y.o. male with PMH of CAD, ischemic cardiomyopathy s/p ICD,  ?HTN, HLD and DM II.  He had a remote PCI in the 90s and then CABG in 2000 at [facility].  He went back to the hospital in November 2018 with chest pain, respiratory failure, hypertensive emergency and persistent right of SVT.  There was some concern of STEMI with ST elevation in V1-V2.  He was intubated and underwent emergent cardiac catheterization which did not reveal any culprit lesion.  It showed 2 patent grafts with chronic RCA graft occlusion with collaterals, EF 25 to 30% with grade 1 DD.  Medical therapy was recommended.  Patient was readmitted in December 2018 with bradycardia, frequent urination and repetitive runs of SVT and was  started on amiodarone. Amlodipine was added due to elevated blood pressure.  Repeat echocardiogram in March 2019 showed EF 30 to 35%, he was referred to EP for ICD implantation.  He eventually underwent ICD implantation by Dr. Lovena Le on 04/14/2017.  He was last seen by Dr. Martinique in April 2019 at which time he was doing well.  Amiodarone was stopped due to elevated TSH and liver function.  He does have a history of allergic reaction with hydralazine.  Since the last visit, he has been diagnosed with diabetes. He was previously not a candidate for Entresto or Jardiance due to cost.  ? ?Last seen  for virtual visit in Jan 2022 by Almyra Deforest PA-C. Has not physically been seen in our office since 2019.  ? ?Patient reports  he has been quite secluded since the start of pandemic.  He does not do much activity at all and mainly sit in the chair. His only activity is walking out to the mailbox or getting his paper.  He denies any chest pain, lower extremity edema or worsening shortness of breath but does mention that his functional ability is quite limited.  He does get short of breath with activity.  He reports there is a nurse coming out to his house periodically and last BP and HR were OK. No recent labs done.  ? ?The patient does not have symptoms concerning for COVID-19 infection (fever, chills, cough, or new shortness of breath).  ? ? ?Past Medical History:  ?Diagnosis Date  ? Coronary artery disease   ?  Hyperlipidemia   ? Hypertension   ? MI (myocardial infarction) ([place])   ? ?Past Surgical History:  ?Procedure Laterality Date  ? CORONARY ARTERY BYPASS GRAFT    ? ICD IMPLANT N/A 04/14/2017  ? Procedure: ICD IMPLANT;  Surgeon: Evans Lance, MD;  Location: [place] CV LAB;  Service: Cardiovascular;  Laterality: N/A;  ? LEFT HEART CATH AND CORONARY ANGIOGRAPHY N/A 12/16/2016  ? Procedure: LEFT HEART CATH AND CORONARY ANGIOGRAPHY;  Surgeon: Martinique, Dima Ferrufino M, MD;  Location: [place] [facility];  Service: Cardiovascular;   Laterality: N/A;  ?  ? ?Current Meds  ?Medication Sig  ? aspirin EC 81 MG EC tablet Take 1 tablet (81 mg total) by mouth daily.  ? atorvastatin (LIPITOR) 80 MG tablet Take 1/2 (one-half) tablet by mouth once daily  ? furosemide (LASIX) 40 MG tablet Take 1 tablet (40 mg total) by mouth daily.  ? metoprolol tartrate (LOPRESSOR) 25 MG tablet Take 1 tablet by mouth twice daily  ? Multiple Vitamin (MULTIVITAMIN) tablet Take 1 tablet by mouth daily.  ? nitroGLYCERIN (NITROSTAT) 0.4 MG SL tablet Place 1 tablet (0.4 mg total) under the tongue every 5 (five) minutes x 3 doses as needed for chest pain.  ? valsartan (DIOVAN) 160 MG tablet Take 1 tablet by mouth once daily  ?  ? ?Allergies:   Hydralazine hcl  ? ?Social History  ? ?Tobacco Use  ? Smoking status: Former  ?  Types: Cigarettes  ?  Quit date: 12/11/2016  ?  Years since quitting: 4.3  ? Smokeless tobacco: Never  ?Vaping Use  ? Vaping Use: Never used  ?Substance Use Topics  ? Alcohol use: No  ? Drug use: No  ?  ? ?Family Hx: ?The patient's family history includes Heart disease in his mother. ? ?ROS:   ?Please see the history of present illness.    ? ?All other systems reviewed and are negative. ? ? ?Prior CV studies:   ?The following studies were reviewed today: ? ?Echo 03/26/2017 ?LV EF: 30% -   35%  ? ?-------------------------------------------------------------------  ?Indications:      CHF (I50.22).  ? ?-------------------------------------------------------------------  ?History:   PMH:   Coronary artery disease.  Congestive heart  ?failure.  Ischemic cardiomyopathy.  PMH:   Myocardial infarction.  ?Risk factors:  SVT. Former tobacco use. Hypertension. Diabetes  ?mellitus. Dyslipidemia.  ? ?-------------------------------------------------------------------  ?Study Conclusions  ? ?- Procedure narrative: Transthoracic echocardiography. Image  ?  quality was suboptimal. The study was technically difficult.  ?  Intravenous contrast (Definity) was administered.  ?-  Left ventricle: There is significant swirling of definity  ?  contrast in the apex c/w sluggish flow. Cannot rule out early  ?  forming LV apical thrombus. The cavity size was severely dilated.  ?  Systolic function was moderately to severely reduced. The  ?  estimated ejection fraction was in the range of 30% to 35%. There  ?  is akinesis of the apical anterior, apical septal, apical  ?  inferior, mid anteroseptal and apical myocardium. There is  ?  akinesis of the basalinferior myocardium. There was a reduced  ?  contribution of atrial contraction to ventricular filling, due to  ?  increased ventricular diastolic pressure or atrial contractile  ?  dysfunction. Doppler parameters are consistent with a reversible  ?  restrictive pattern, indicative of decreased left ventricular  ?  diastolic compliance and/or increased left atrial pressure (grade  ?  3 diastolic dysfunction). Doppler parameters are  consistent with  ?  high ventricular filling pressure.  ?- Aortic valve: Trileaflet; mildly thickened, moderately calcified  ?  leaflets.  ?- Aorta: Aortic root dimension: 38 mm (ED). Ascending aortic  ?  diameter: 38 mm (S).  ?- Aortic root: The aortic root was mildly dilated.  ?- Ascending aorta: The ascending aorta was mildly dilated.  ?- Left atrium: The atrium was moderately dilated.  ?- Right ventricle: The cavity size was moderately dilated. Wall  ?  thickness was normal. Systolic function was mildly reduced.  ? ?Labs/Other Tests and Data Reviewed:   ? ?EKG:  none to review.  ? ?Recent Labs: ?No results found for requested labs within last 8760 hours.  ? ?Recent Lipid Panel ?Lab Results  ?Component Value Date/Time  ? CHOL 133 03/05/2017 09:48 AM  ? TRIG 140 03/05/2017 09:48 AM  ? HDL 35 (L) 03/05/2017 09:48 AM  ? CHOLHDL 6.5 12/17/2016 06:29 AM  ? LDLCALC 70 03/05/2017 09:48 AM  ? ? ?Wt Readings from Last 3 Encounters:  ?04/10/21 199 lb (90.3 kg)  ?05/07/17 207 lb 12.8 oz (94.3 kg)  ?04/15/17 204 lb (92.5 kg)  ?   ? ?Risk Assessment/Calculations:   ?  ? ?Objective:   ? ?Vital Signs:  Ht '6\' 1"'$  (1.854 m)   Wt 199 lb (90.3 kg)   BMI 26.25 kg/m?   ? ?VITAL SIGNS:  reviewed ? ?ASSESSMENT & PLAN:   ? ?CAD: History of CABG 2000

## 2021-04-10 ENCOUNTER — Telehealth: Payer: Self-pay

## 2021-04-10 ENCOUNTER — Other Ambulatory Visit: Payer: Self-pay

## 2021-04-10 ENCOUNTER — Encounter: Payer: Self-pay | Admitting: Cardiology

## 2021-04-10 ENCOUNTER — Telehealth (INDEPENDENT_AMBULATORY_CARE_PROVIDER_SITE_OTHER): Payer: Medicare Other | Admitting: Cardiology

## 2021-04-10 ENCOUNTER — Ambulatory Visit: Payer: Medicare Other | Admitting: Cardiology

## 2021-04-10 VITALS — Ht 73.0 in | Wt 199.0 lb

## 2021-04-10 DIAGNOSIS — I255 Ischemic cardiomyopathy: Secondary | ICD-10-CM

## 2021-04-10 DIAGNOSIS — I5022 Chronic systolic (congestive) heart failure: Secondary | ICD-10-CM

## 2021-04-10 DIAGNOSIS — E119 Type 2 diabetes mellitus without complications: Secondary | ICD-10-CM

## 2021-04-10 DIAGNOSIS — I2510 Atherosclerotic heart disease of native coronary artery without angina pectoris: Secondary | ICD-10-CM | POA: Diagnosis not present

## 2021-04-10 DIAGNOSIS — I10 Essential (primary) hypertension: Secondary | ICD-10-CM

## 2021-04-10 DIAGNOSIS — E785 Hyperlipidemia, unspecified: Secondary | ICD-10-CM

## 2021-04-10 NOTE — Patient Instructions (Signed)
Medication Instructions:  ?Continue same medications ?*If you need a refill on your cardiac medications before your next appointment, please call your pharmacy* ? ? ?Lab Work: ?Need labs  cmet,lipid panel,cbc,tsh,a1c  Lab order enclosed ? ? ?Testing/Procedures: ?None ordered ? ? ?Follow-Up: ?At [facility], you and your health needs are our priority.  As part of our continuing mission to provide you with exceptional heart care, we have created designated Provider Care Teams.  These Care Teams include your primary Cardiologist (physician) and Advanced Practice Providers (APPs -  Physician Assistants and Nurse Practitioners) who all work together to provide you with the care you need, when you need it. ? ?We recommend signing up for the patient portal called "MyChart".  Sign up information is provided on this After Visit Summary.  MyChart is used to connect with patients for Virtual Visits (Telemedicine).  Patients are able to view lab/test results, encounter notes, upcoming appointments, etc.  Non-urgent messages can be sent to your provider as well.   ?To learn more about what you can do with MyChart, go to [email or web address].   ? ?Your next appointment:  Need to schedule a office visit  ?  ? ?The format for your next appointment:  Office ? ? ?Provider: Dr.Jordan   ? ? ?

## 2021-04-10 NOTE — Addendum Note (Signed)
Addended by: Kathyrn Lass on: 04/10/2021 03:46 PM ? ? Modules accepted: Orders ? ?

## 2021-04-10 NOTE — Telephone Encounter (Signed)
?  Patient Consent for Virtual Visit  ? ? ?   ? ?[name] has provided verbal consent on 04/10/2021 for a virtual visit (video or telephone). ? ? ?CONSENT FOR VIRTUAL VISIT FOR:  Luis Donaldson  ?By participating in this virtual visit I agree to the following: ? ?I hereby voluntarily request, consent and authorize [facility] and its employed or contracted physicians, physician assistants, nurse practitioners or other licensed health care professionals (the Practitioner), to provide me with telemedicine health care services (the ?Services") as deemed necessary by the treating Practitioner. I acknowledge and consent to receive the Services by the Practitioner via telemedicine. I understand that the telemedicine visit will involve communicating with the Practitioner through live audiovisual communication technology and the disclosure of certain medical information by electronic transmission. I acknowledge that I have been given the opportunity to request an in-person assessment or other available alternative prior to the telemedicine visit and am voluntarily participating in the telemedicine visit. ? ?I understand that I have the right to withhold or withdraw my consent to the use of telemedicine in the course of my care at any time, without affecting my right to future care or treatment, and that the Practitioner or I may terminate the telemedicine visit at any time. I understand that I have the right to inspect all information obtained and/or recorded in the course of the telemedicine visit and may receive copies of available information for a reasonable fee.  I understand that some of the potential risks of receiving the Services via telemedicine include:  ?Delay or interruption in medical evaluation due to technological equipment failure or disruption; ?Information transmitted may not be sufficient (e.g. poor resolution of images) to allow for appropriate medical decision making by the Practitioner; and/or   ?In rare instances, security protocols could fail, causing a breach of personal health information. ? ?Furthermore, I acknowledge that it is my responsibility to provide information about my medical history, conditions and care that is complete and accurate to the best of my ability. I acknowledge that Practitioner's advice, recommendations, and/or decision may be based on factors not within their control, such as incomplete or inaccurate data provided by me or distortions of diagnostic images or specimens that may result from electronic transmissions. I understand that the practice of medicine is not an exact science and that Practitioner makes no warranties or guarantees regarding treatment outcomes. I acknowledge that a copy of this consent can be made available to me via my patient portal ([place]), or I can request a printed copy by calling the office of [name].   ? ?I understand that my insurance will be billed for this visit.  ? ?I have read or had this consent read to me. ?I understand the contents of this consent, which adequately explains the benefits and risks of the Services being provided via telemedicine.  ?I have been provided ample opportunity to ask questions regarding this consent and the Services and have had my questions answered to my satisfaction. ?I give my informed consent for the services to be provided through the use of telemedicine in my medical care ? ? ? ?

## 2021-06-25 ENCOUNTER — Other Ambulatory Visit: Payer: Self-pay | Admitting: Cardiology

## 2021-06-25 ENCOUNTER — Other Ambulatory Visit: Payer: Self-pay | Admitting: Physician Assistant

## 2021-08-27 ENCOUNTER — Telehealth: Payer: Self-pay | Admitting: Internal Medicine

## 2021-08-27 ENCOUNTER — Encounter: Payer: Self-pay | Admitting: Cardiology

## 2021-08-27 ENCOUNTER — Other Ambulatory Visit: Payer: Self-pay | Admitting: Physician Assistant

## 2021-08-27 ENCOUNTER — Other Ambulatory Visit: Payer: Self-pay | Admitting: Cardiology

## 2021-08-27 NOTE — Telephone Encounter (Signed)
Gay Filler with [facility] states the patient will be getting radiation therapy with them and they need recommendation on the patient's device. She says they faxed a form to the office but are not sure if it was received. She says the patient will also need his device checked within 4 weeks of receiving radiation. Phone: [phone]

## 2021-08-27 NOTE — Telephone Encounter (Signed)
error 

## 2021-08-27 NOTE — Telephone Encounter (Signed)
Attempted to return call, sally is unavailable.   Unfortunately, I am unable to clear patient for procedure due to last OV with Dr. Lovena Le 04/07/2017. Patient will need to reestablish with our clinic. LMTCB.

## 2021-08-28 NOTE — Telephone Encounter (Signed)
2nd attempt to reach Luis Donaldson spoke with scheduler informed her that we could not clear patient due to not being seen since 2019. Will also contact patient and inform him.

## 2021-08-28 NOTE — Telephone Encounter (Signed)
Spoke with patient, patient scheduled for appointment with Tommye Standard on 08/31/21 at 8:50AM

## 2021-08-31 ENCOUNTER — Ambulatory Visit (INDEPENDENT_AMBULATORY_CARE_PROVIDER_SITE_OTHER): Payer: Medicare Other | Admitting: Physician Assistant

## 2021-08-31 ENCOUNTER — Ambulatory Visit (INDEPENDENT_AMBULATORY_CARE_PROVIDER_SITE_OTHER): Payer: Medicare Other

## 2021-08-31 ENCOUNTER — Encounter: Payer: Self-pay | Admitting: Physician Assistant

## 2021-08-31 VITALS — BP 140/60 | HR 59 | Ht 69.0 in | Wt 192.6 lb

## 2021-08-31 DIAGNOSIS — I2510 Atherosclerotic heart disease of native coronary artery without angina pectoris: Secondary | ICD-10-CM

## 2021-08-31 DIAGNOSIS — I255 Ischemic cardiomyopathy: Secondary | ICD-10-CM

## 2021-08-31 DIAGNOSIS — Z95810 Presence of automatic (implantable) cardiac defibrillator: Secondary | ICD-10-CM

## 2021-08-31 DIAGNOSIS — I5022 Chronic systolic (congestive) heart failure: Secondary | ICD-10-CM | POA: Diagnosis not present

## 2021-08-31 LAB — CUP PACEART INCLINIC DEVICE CHECK
Battery Remaining Longevity: 60 mo
Brady Statistic RA Percent Paced: 7.5 %
Brady Statistic RV Percent Paced: 0.24 %
Date Time Interrogation Session: 20230811133011
HighPow Impedance: 65.25 Ohm
Implantable Lead Implant Date: 20190325
Implantable Lead Implant Date: 20190325
Implantable Lead Location: 753859
Implantable Lead Location: 753860
Implantable Lead Model: 7122
Implantable Pulse Generator Implant Date: 20190325
Lead Channel Impedance Value: 362.5 Ohm
Lead Channel Impedance Value: 562.5 Ohm
Lead Channel Pacing Threshold Amplitude: 0.75 V
Lead Channel Pacing Threshold Amplitude: 0.75 V
Lead Channel Pacing Threshold Amplitude: 1 V
Lead Channel Pacing Threshold Amplitude: 1 V
Lead Channel Pacing Threshold Pulse Width: 0.5 ms
Lead Channel Pacing Threshold Pulse Width: 0.5 ms
Lead Channel Pacing Threshold Pulse Width: 0.5 ms
Lead Channel Pacing Threshold Pulse Width: 0.5 ms
Lead Channel Sensing Intrinsic Amplitude: 1.3 mV
Lead Channel Sensing Intrinsic Amplitude: 12 mV
Lead Channel Setting Pacing Amplitude: 2.5 V
Lead Channel Setting Pacing Amplitude: 2.5 V
Lead Channel Setting Pacing Pulse Width: 0.5 ms
Lead Channel Setting Sensing Sensitivity: 0.5 mV
Pulse Gen Serial Number: 7387147

## 2021-08-31 MED ORDER — METOPROLOL TARTRATE 25 MG PO TABS: ORAL_TABLET | ORAL | 2 refills | Status: DC

## 2021-08-31 MED ORDER — FUROSEMIDE 40 MG PO TABS: 20.0000 mg | ORAL_TABLET | Freq: Every day | ORAL | 2 refills | Status: DC

## 2021-08-31 MED ORDER — ATORVASTATIN CALCIUM 80 MG PO TABS: 40.0000 mg | ORAL_TABLET | Freq: Every day | ORAL | 2 refills | Status: DC

## 2021-08-31 MED ORDER — NITROGLYCERIN 0.4 MG SL SUBL: 0.4000 mg | SUBLINGUAL_TABLET | SUBLINGUAL | 3 refills | Status: AC | PRN

## 2021-08-31 MED ORDER — VALSARTAN 160 MG PO TABS: ORAL_TABLET | ORAL | 2 refills | Status: DC

## 2021-08-31 NOTE — Progress Notes (Signed)
Cardiology Office Note Date:  08/31/2021  Patient ID:  Luis Donaldson, Luis Donaldson [birth year], MRN [id number] PCP:  Ladell Pier, MD  Cardiologist:  Dr. Martinique Electrophysiologist: Dr. Lovena Le    Chief Complaint: over due, pending XRT  History of Present Illness: Luis Donaldson is a 76 y.o. male with history CAD (remote PCI 90's, CAGB 2000), SVT, ICM w/ICD,    He comes in today to be seen for Dr. Lovena Le, last seen by him at the time of his device implant back in 2019. Likely lost to follow up 2/2 COVID.  He has had a couple telehealth visits with cardiology team, the last with Dr. Martinique march 2023, remained quite secluded since the onset of COVID, some SOB with activities, overdue for labs  Entresto and jardiance have been cost prohibitive, allergy to hydralazine  He has been found with prostate cancer, started on Lupron 08/02/21 and recommended to undergo radiation tx, needs to have device specifics from [employer] regarding XRT and his device  Dola he has no concerns. Starting to get some hot flashes with the Lupron No CP, palpitations ro SOB NO near syncope or syncope. No device therapies   Device information [employer] dual chamber ICD implanted 04/14/2017  AAD hx Amiodarone for SVT eventually stopped 2/2 abnormal LFTs/TSH in 2019  Past Medical History:  Diagnosis Date   Coronary artery disease    Hyperlipidemia    Hypertension    MI (myocardial infarction) [facility])     Past Surgical History:  Procedure Laterality Date   CORONARY ARTERY BYPASS GRAFT     ICD IMPLANT N/A 04/14/2017   Procedure: ICD IMPLANT;  Surgeon: Evans Lance, MD;  Location: [place] [facility];  Service: Cardiovascular;  Laterality: N/A;   LEFT HEART CATH AND CORONARY ANGIOGRAPHY N/A 12/16/2016   Procedure: LEFT HEART CATH AND CORONARY ANGIOGRAPHY;  Surgeon: Martinique, Peter M, MD;  Location: [place] [facility];  Service: Cardiovascular;  Laterality: N/A;    Current Outpatient Medications   Medication Sig Dispense Refill   aspirin EC 81 MG EC tablet Take 1 tablet (81 mg total) by mouth daily. 30 tablet 11   atorvastatin (LIPITOR) 80 MG tablet Take 1/2 (one-half) tablet by mouth once daily 45 tablet 0   furosemide (LASIX) 40 MG tablet Take 1/2 (one-half) tablet by mouth once daily 45 tablet 0   metoprolol tartrate (LOPRESSOR) 25 MG tablet TAKE 1 TABLET BY MOUTH TWICE DAILY . APPOINTMENT REQUIRED FOR FUTURE REFILLS 180 tablet 0   Multiple Vitamin (MULTIVITAMIN) tablet Take 1 tablet by mouth daily.     nitroGLYCERIN (NITROSTAT) 0.4 MG SL tablet Place 1 tablet (0.4 mg total) under the tongue every 5 (five) minutes x 3 doses as needed for chest pain. 25 tablet 12   valsartan (DIOVAN) 160 MG tablet TAKE 1 TABLET BY MOUTH ONCE DAILY . APPOINTMENT REQUIRED FOR FUTURE REFILLS 90 tablet 0   No current facility-administered medications for this visit.    Allergies:   Hydralazine hcl   Social History:  The patient  reports that he quit smoking about 4 years ago. His smoking use included cigarettes. He has never used smokeless tobacco. He reports that he does not drink alcohol and does not use drugs.   Family History:  The patient's family history includes Heart disease in his mother.  ROS:  Please see the history of present illness.    All other systems are reviewed and otherwise negative.   PHYSICAL EXAM:  VS:  There  were no vitals taken for this visit. BMI: There is no height or weight on file to calculate BMI. Well nourished, well developed, in no acute distress HEENT: normocephalic, atraumatic Neck: no JVD, carotid bruits or masses Cardiac:   RRR; no significant murmurs, no rubs, or gallops Lungs:  CTA b/l, no wheezing, rhonchi or rales Abd: soft, nontender MS: no deformity or atrophy Ext: no edema Skin: warm and dry, no rash Neuro:  No gross deficits appreciated Psych: euthymic mood, full affect  ICD site is stable, no tethering or discomfort   EKG:  Done today and  reviewed by myself shows  SB 59, 1st degree AVblock 276m, not markedly changed from 2019  Device interrogation done today and reviewed by myself:  Battery and lead measurements are good No VT He has had a few AMS episodes, most for Afib/futter, a couple 1:1, longest 168mutes, burden <1% No VT  03/26/2017: TTE Procedure narrative: Transthoracic echocardiography. Image    quality was suboptimal. The study was technically difficult.    Intravenous contrast (Definity) was administered.  - Left ventricle: There is significant swirling of definity    contrast in the apex c/w sluggish flow. Cannot rule out early    forming LV apical thrombus. The cavity size was severely dilated.    Systolic function was moderately to severely reduced. The    estimated ejection fraction was in the range of 30% to 35%. There    is akinesis of the apical anterior, apical septal, apical    inferior, mid anteroseptal and apical myocardium. There is    akinesis of the basalinferior myocardium. There was a reduced    contribution of atrial contraction to ventricular filling, due to    increased ventricular diastolic pressure or atrial contractile    dysfunction. Doppler parameters are consistent with a reversible    restrictive pattern, indicative of decreased left ventricular    diastolic compliance and/or increased left atrial pressure (grade    3 diastolic dysfunction). Doppler parameters are consistent with    high ventricular filling pressure.  - Aortic valve: Trileaflet; mildly thickened, moderately calcified    leaflets.  - Aorta: Aortic root dimension: 38 mm (ED). Ascending aortic    diameter: 38 mm (S).  - Aortic root: The aortic root was mildly dilated.  - Ascending aorta: The ascending aorta was mildly dilated.  - Left atrium: The atrium was moderately dilated.  - Right ventricle: The cavity size was moderately dilated. Wall    thickness was normal. Systolic function was mildly reduced.    12/16/2016: LHC 1.  Severe 3 vessel occlusive CAD 2.  Patent LIMA to the LAD 3.  Patent free radial graft to the OM1 4.  Occluded SVG to the RCA. This appears chronic.  5.  The RCA and first diagonal are filled by collaterals 6.  Severe LV dysfunction. EF estimated at 25-30% 7.  Markedly elevated LVEDP.   Plan: I am unable to identify any culprit lesion. The LAD and LCx territories are well revascularized. The SVG to the RCA is occluded but this appears chronic and the native RCA also has a chronic occlusion and is well collateralized. The first diagonal is small and also has collaterals. I doubt this was grafted and no other grafts identified. I think his presentation was due to decompensated CHF with arrhythmia and Hypertensive urgency. Will admit to ICU. Consult CCM for management of vent. Continue IV Ntg and diurese with IV lasix. Will resume IV heparin until cardiac enzymes are  cycled. Check Echo.     Recent Labs: No results found for requested labs within last 365 days.  No results found for requested labs within last 365 days.   CrCl cannot be calculated (Patient's most recent lab result is older than the maximum 21 days allowed.).   Wt Readings from Last 3 Encounters:  04/10/21 199 lb (90.3 kg)  05/07/17 207 lb 12.8 oz (94.3 kg)  04/15/17 204 lb (92.5 kg)     Other studies reviewed: Additional studies/records reviewed today include: summarized above  ASSESSMENT AND PLAN:  ICD Intact function The patient was provided with [facility] radiation information for devices to provide to his oncologist RF was established today via programmer Discussed with device clinic will make sure he is on our remote schedule Will have him back in 59moto ensure f/u back on track for him  ICM Chronic CHF No symptoms or exam findings of volume OL On BB/ARB diuretic  I recommended labs today but he declined, said he has had numerous labs of late and asked I get them from his  PMD   CAD No anginal symptoms On ASA, statin, BB He assures me he is having labs done somewhere.   Disposition: F/u with [facility] if needed  Current medicines are reviewed at length with the patient today.  The patient did not have any concerns regarding medicines.  SiVenetia NightPA-C 08/31/2021 4:11 AM     [facility] [address] [phone])  [phone])

## 2021-08-31 NOTE — Patient Instructions (Addendum)
Medication Instructions:   Your physician recommends that you continue on your current medications as directed. Please refer to the Current Medication list given to you today.   *If you need a refill on your cardiac medications before your next appointment, please call your pharmacy*   Lab Work: [place]    If you have labs (blood work) drawn today and your tests are completely normal, you will receive your results only by: [place] (if you have MyChart) OR A paper copy in the mail If you have any lab test that is abnormal or we need to change your treatment, we will call you to review the results.   Testing/Procedures: NONE ORDERED  TODAY    Follow-Up: At [facility], you and your health needs are our priority.  As part of our continuing mission to provide you with exceptional heart care, we have created designated Provider Care Teams.  These Care Teams include your primary Cardiologist (physician) and Advanced Practice Providers (APPs -  Physician Assistants and Nurse Practitioners) who all work together to provide you with the care you need, when you need it.  We recommend signing up for the patient portal called "MyChart".  Sign up information is provided on this After Visit Summary.  MyChart is used to connect with patients for Virtual Visits (Telemedicine).  Patients are able to view lab/test results, encounter notes, upcoming appointments, etc.  Non-urgent messages can be sent to your provider as well.   To learn more about what you can do with MyChart, go to [email or web address].    Your next appointment:   3 month(s)  The format for your next appointment:   In Person  Provider:   You may see Dr. Jori Moll!!   Other Instructions   Important Information About [place]

## 2021-09-03 LAB — CUP PACEART REMOTE DEVICE CHECK
Battery Remaining Longevity: 54 mo
Battery Remaining Percentage: 55 %
Battery Voltage: 2.93 V
Brady Statistic AP VP Percent: 1 %
Brady Statistic AP VS Percent: 3 %
Brady Statistic AS VP Percent: 0 %
Brady Statistic AS VS Percent: 96 %
Brady Statistic RA Percent Paced: 1.8 %
Brady Statistic RV Percent Paced: 1 %
Date Time Interrogation Session: 20230811130524
HighPow Impedance: 63 Ohm
HighPow Impedance: 65 Ohm
Implantable Lead Implant Date: 20190325
Implantable Lead Implant Date: 20190325
Implantable Lead Location: 753859
Implantable Lead Location: 753860
Implantable Lead Model: 7122
Implantable Pulse Generator Implant Date: 20190325
Lead Channel Impedance Value: 360 Ohm
Lead Channel Impedance Value: 560 Ohm
Lead Channel Pacing Threshold Amplitude: 0.75 V
Lead Channel Pacing Threshold Amplitude: 1 V
Lead Channel Pacing Threshold Pulse Width: 0.5 ms
Lead Channel Pacing Threshold Pulse Width: 0.5 ms
Lead Channel Sensing Intrinsic Amplitude: 1.3 mV
Lead Channel Sensing Intrinsic Amplitude: 12 mV
Lead Channel Setting Pacing Amplitude: 2.5 V
Lead Channel Setting Pacing Amplitude: 2.5 V
Lead Channel Setting Pacing Pulse Width: 0.5 ms
Lead Channel Setting Sensing Sensitivity: 0.5 mV
Pulse Gen Serial Number: 7387147

## 2021-09-23 NOTE — Progress Notes (Signed)
Remote ICD transmission.   

## 2021-10-29 ENCOUNTER — Other Ambulatory Visit: Payer: Self-pay | Admitting: Cardiology

## 2021-11-09 ENCOUNTER — Telehealth: Payer: Self-pay | Admitting: *Deleted

## 2021-11-09 MED ORDER — ATORVASTATIN CALCIUM 40 MG PO TABS: 40.0000 mg | ORAL_TABLET | Freq: Every day | ORAL | 3 refills | Status: DC

## 2021-11-09 NOTE — Telephone Encounter (Signed)
REFILL 

## 2021-12-07 ENCOUNTER — Encounter: Payer: Medicare Other | Admitting: Internal Medicine

## 2022-01-03 ENCOUNTER — Telehealth: Payer: Self-pay | Admitting: Internal Medicine

## 2022-01-03 NOTE — Telephone Encounter (Signed)
Attempted to call patient to schedule DC apt within next 2 weeks for check prior to radiation. No answer, LMTCB.  I spoke to [facility] and she advised their protocol patient needs device check within 30 days of starting radiation (radiation will start in 2 weeks) and 2 weeks after radiation. Tiffany also stated she can see care everywhere so no need to send any paper work with patient. Just please make sure uploaded and exported into EPIC so she can see it.  Forwarding to [place] for scheduling. Patient may also use 8:00 AM slots.

## 2022-01-03 NOTE — Telephone Encounter (Signed)
I spoke with the patient and he agreed to send a manual transmission. He is going to call me in the morning to help him. I told him It is no guarantee that we can get him in before the December 18th.

## 2022-01-03 NOTE — Telephone Encounter (Signed)
Tiffany from [place] [facility] called stating she needs the pt to have a device check before he starts radiation

## 2022-01-04 ENCOUNTER — Ambulatory Visit: Payer: Medicare Other

## 2022-01-04 ENCOUNTER — Ambulatory Visit: Payer: Medicare Other | Attending: Cardiovascular Disease

## 2022-01-04 ENCOUNTER — Encounter: Payer: Medicare Other | Admitting: Cardiology

## 2022-01-04 DIAGNOSIS — I255 Ischemic cardiomyopathy: Secondary | ICD-10-CM | POA: Diagnosis not present

## 2022-01-04 DIAGNOSIS — Z95810 Presence of automatic (implantable) cardiac defibrillator: Secondary | ICD-10-CM | POA: Diagnosis not present

## 2022-01-04 LAB — CUP PACEART INCLINIC DEVICE CHECK
Battery Remaining Longevity: 56 mo
Brady Statistic RA Percent Paced: 3.3 %
Brady Statistic RV Percent Paced: 0.47 %
Date Time Interrogation Session: 20231215135447
HighPow Impedance: 59.625
Implantable Lead Connection Status: 753985
Implantable Lead Connection Status: 753985
Implantable Lead Implant Date: 20190325
Implantable Lead Implant Date: 20190325
Implantable Lead Location: 753859
Implantable Lead Location: 753860
Implantable Lead Model: 7122
Implantable Pulse Generator Implant Date: 20190325
Lead Channel Impedance Value: 337.5 Ohm
Lead Channel Impedance Value: 537.5 Ohm
Lead Channel Pacing Threshold Amplitude: 0.75 V
Lead Channel Pacing Threshold Amplitude: 0.75 V
Lead Channel Pacing Threshold Amplitude: 0.75 V
Lead Channel Pacing Threshold Amplitude: 0.75 V
Lead Channel Pacing Threshold Pulse Width: 0.5 ms
Lead Channel Pacing Threshold Pulse Width: 0.5 ms
Lead Channel Pacing Threshold Pulse Width: 0.5 ms
Lead Channel Pacing Threshold Pulse Width: 0.5 ms
Lead Channel Sensing Intrinsic Amplitude: 1 mV
Lead Channel Sensing Intrinsic Amplitude: 12 mV
Lead Channel Setting Pacing Amplitude: 2.5 V
Lead Channel Setting Pacing Amplitude: 2.5 V
Lead Channel Setting Pacing Pulse Width: 0.5 ms
Lead Channel Setting Sensing Sensitivity: 0.5 mV
Pulse Gen Serial Number: 7387147
Zone Setting Status: 755011

## 2022-01-04 LAB — CUP PACEART REMOTE DEVICE CHECK
Battery Remaining Longevity: 51 mo
Battery Remaining Longevity: 51 mo
Battery Remaining Percentage: 52 %
Battery Remaining Percentage: 52 %
Battery Voltage: 2.93 V
Battery Voltage: 2.93 V
Brady Statistic AP VP Percent: 1 %
Brady Statistic AP VP Percent: 1 %
Brady Statistic AP VS Percent: 2 %
Brady Statistic AP VS Percent: 2 %
Brady Statistic AS VP Percent: 0 %
Brady Statistic AS VP Percent: 0 %
Brady Statistic AS VS Percent: 97 %
Brady Statistic AS VS Percent: 97 %
Brady Statistic RA Percent Paced: 1.5 %
Brady Statistic RA Percent Paced: 1.5 %
Brady Statistic RV Percent Paced: 1 %
Brady Statistic RV Percent Paced: 1 %
Date Time Interrogation Session: 20231215154023
Date Time Interrogation Session: 20231215154023
HighPow Impedance: 60 Ohm
HighPow Impedance: 60 Ohm
HighPow Impedance: 60 Ohm
HighPow Impedance: 60 Ohm
Implantable Lead Connection Status: 753985
Implantable Lead Connection Status: 753985
Implantable Lead Connection Status: 753985
Implantable Lead Connection Status: 753985
Implantable Lead Implant Date: 20190325
Implantable Lead Implant Date: 20190325
Implantable Lead Implant Date: 20190325
Implantable Lead Implant Date: 20190325
Implantable Lead Location: 753859
Implantable Lead Location: 753859
Implantable Lead Location: 753860
Implantable Lead Location: 753860
Implantable Lead Model: 7122
Implantable Lead Model: 7122
Implantable Pulse Generator Implant Date: 20190325
Implantable Pulse Generator Implant Date: 20190325
Lead Channel Impedance Value: 340 Ohm
Lead Channel Impedance Value: 340 Ohm
Lead Channel Impedance Value: 540 Ohm
Lead Channel Impedance Value: 540 Ohm
Lead Channel Pacing Threshold Amplitude: 0.75 V
Lead Channel Pacing Threshold Amplitude: 0.75 V
Lead Channel Pacing Threshold Amplitude: 0.75 V
Lead Channel Pacing Threshold Amplitude: 0.75 V
Lead Channel Pacing Threshold Pulse Width: 0.5 ms
Lead Channel Pacing Threshold Pulse Width: 0.5 ms
Lead Channel Pacing Threshold Pulse Width: 0.5 ms
Lead Channel Pacing Threshold Pulse Width: 0.5 ms
Lead Channel Sensing Intrinsic Amplitude: 0.9 mV
Lead Channel Sensing Intrinsic Amplitude: 0.9 mV
Lead Channel Sensing Intrinsic Amplitude: 12 mV
Lead Channel Sensing Intrinsic Amplitude: 12 mV
Lead Channel Setting Pacing Amplitude: 2.5 V
Lead Channel Setting Pacing Amplitude: 2.5 V
Lead Channel Setting Pacing Amplitude: 2.5 V
Lead Channel Setting Pacing Amplitude: 2.5 V
Lead Channel Setting Pacing Pulse Width: 0.5 ms
Lead Channel Setting Pacing Pulse Width: 0.5 ms
Lead Channel Setting Sensing Sensitivity: 0.5 mV
Lead Channel Setting Sensing Sensitivity: 0.5 mV
Pulse Gen Serial Number: 7387147
Pulse Gen Serial Number: 7387147
Zone Setting Status: 755011
Zone Setting Status: 755011

## 2022-01-04 NOTE — Telephone Encounter (Signed)
Patient in today at 1:30pm for device interrogation.   See encounter for full details.

## 2022-01-04 NOTE — Progress Notes (Signed)
ICD check in clinic. Normal device function. Patient interrogation today for radiation tx clearance scheduled for 01/07/22.  Thresholds and sensing consistent with previous device measurements. Impedance trends stable over time. No mode switches. No ventricular arrhythmias. Histogram distribution appropriate for patient and level of activity. No changes made this session. Device programmed at appropriate safety margins. Device programmed to optimize intrinsic conduction. Estimated longevity 4.8 YEARS. Pt enrolled in remote follow-up. Patient education completed including shock plan. Auditory/vibratory alert demonstrated.

## 2022-01-07 ENCOUNTER — Ambulatory Visit (INDEPENDENT_AMBULATORY_CARE_PROVIDER_SITE_OTHER): Payer: Medicare Other

## 2022-01-07 DIAGNOSIS — I255 Ischemic cardiomyopathy: Secondary | ICD-10-CM

## 2022-01-09 LAB — CUP PACEART REMOTE DEVICE CHECK
Battery Remaining Longevity: 51 mo
Battery Remaining Longevity: 51 mo
Battery Remaining Percentage: 52 %
Battery Remaining Percentage: 52 %
Battery Voltage: 2.93 V
Battery Voltage: 2.93 V
Brady Statistic AP VP Percent: 1 %
Brady Statistic AP VP Percent: 1 %
Brady Statistic AP VS Percent: 2 %
Brady Statistic AP VS Percent: 2 %
Brady Statistic AS VP Percent: 0 %
Brady Statistic AS VP Percent: 0 %
Brady Statistic AS VS Percent: 97 %
Brady Statistic AS VS Percent: 97 %
Brady Statistic RA Percent Paced: 1.5 %
Brady Statistic RA Percent Paced: 1.5 %
Brady Statistic RV Percent Paced: 1 %
Brady Statistic RV Percent Paced: 1 %
Date Time Interrogation Session: 20231215154023
Date Time Interrogation Session: 20231215154023
HighPow Impedance: 60 Ohm
HighPow Impedance: 60 Ohm
HighPow Impedance: 60 Ohm
HighPow Impedance: 60 Ohm
Implantable Lead Connection Status: 753985
Implantable Lead Connection Status: 753985
Implantable Lead Connection Status: 753985
Implantable Lead Connection Status: 753985
Implantable Lead Implant Date: 20190325
Implantable Lead Implant Date: 20190325
Implantable Lead Implant Date: 20190325
Implantable Lead Implant Date: 20190325
Implantable Lead Location: 753859
Implantable Lead Location: 753860
Implantable Lead Location: 753859
Implantable Lead Location: 753860
Implantable Lead Model: 7122
Implantable Lead Model: 7122
Implantable Pulse Generator Implant Date: 20190325
Implantable Pulse Generator Implant Date: 20190325
Lead Channel Impedance Value: 340 Ohm
Lead Channel Impedance Value: 540 Ohm
Lead Channel Impedance Value: 340 Ohm
Lead Channel Impedance Value: 540 Ohm
Lead Channel Pacing Threshold Amplitude: 0.75 V
Lead Channel Pacing Threshold Amplitude: 0.75 V
Lead Channel Pacing Threshold Amplitude: 0.75 V
Lead Channel Pacing Threshold Amplitude: 0.75 V
Lead Channel Pacing Threshold Pulse Width: 0.5 ms
Lead Channel Pacing Threshold Pulse Width: 0.5 ms
Lead Channel Pacing Threshold Pulse Width: 0.5 ms
Lead Channel Pacing Threshold Pulse Width: 0.5 ms
Lead Channel Sensing Intrinsic Amplitude: 0.9 mV
Lead Channel Sensing Intrinsic Amplitude: 12 mV
Lead Channel Sensing Intrinsic Amplitude: 0.9 mV
Lead Channel Sensing Intrinsic Amplitude: 12 mV
Lead Channel Setting Pacing Amplitude: 2.5 V
Lead Channel Setting Pacing Amplitude: 2.5 V
Lead Channel Setting Pacing Amplitude: 2.5 V
Lead Channel Setting Pacing Amplitude: 2.5 V
Lead Channel Setting Pacing Pulse Width: 0.5 ms
Lead Channel Setting Pacing Pulse Width: 0.5 ms
Lead Channel Setting Sensing Sensitivity: 0.5 mV
Lead Channel Setting Sensing Sensitivity: 0.5 mV
Pulse Gen Serial Number: 7387147
Pulse Gen Serial Number: 7387147
Zone Setting Status: 755011
Zone Setting Status: 755011

## 2022-02-11 NOTE — Progress Notes (Signed)
Remote ICD transmission.   

## 2022-07-08 ENCOUNTER — Ambulatory Visit (INDEPENDENT_AMBULATORY_CARE_PROVIDER_SITE_OTHER): Payer: Medicare Other

## 2022-07-08 DIAGNOSIS — I255 Ischemic cardiomyopathy: Secondary | ICD-10-CM | POA: Diagnosis not present

## 2022-07-10 LAB — CUP PACEART REMOTE DEVICE CHECK
Battery Remaining Longevity: 47 mo
Battery Remaining Percentage: 48 %
Battery Voltage: 2.92 V
Brady Statistic AP VP Percent: 1.1 %
Brady Statistic AP VS Percent: 8.3 %
Brady Statistic AS VP Percent: 1 %
Brady Statistic AS VS Percent: 88 %
Brady Statistic RA Percent Paced: 5.4 %
Brady Statistic RV Percent Paced: 1.4 %
Date Time Interrogation Session: 20240619123400
HighPow Impedance: 57 Ohm
HighPow Impedance: 57 Ohm
Implantable Lead Connection Status: 753985
Implantable Lead Connection Status: 753985
Implantable Lead Implant Date: 20190325
Implantable Lead Implant Date: 20190325
Implantable Lead Location: 753859
Implantable Lead Location: 753860
Implantable Lead Model: 7122
Implantable Pulse Generator Implant Date: 20190325
Lead Channel Impedance Value: 340 Ohm
Lead Channel Impedance Value: 580 Ohm
Lead Channel Pacing Threshold Amplitude: 0.75 V
Lead Channel Pacing Threshold Amplitude: 0.75 V
Lead Channel Pacing Threshold Pulse Width: 0.5 ms
Lead Channel Pacing Threshold Pulse Width: 0.5 ms
Lead Channel Sensing Intrinsic Amplitude: 1 mV
Lead Channel Sensing Intrinsic Amplitude: 12 mV
Lead Channel Setting Pacing Amplitude: 2.5 V
Lead Channel Setting Pacing Amplitude: 2.5 V
Lead Channel Setting Pacing Pulse Width: 0.5 ms
Lead Channel Setting Sensing Sensitivity: 0.5 mV
Pulse Gen Serial Number: 7387147
Zone Setting Status: 755011

## 2022-07-26 NOTE — Progress Notes (Signed)
Remote ICD transmission.   

## 2022-07-29 ENCOUNTER — Other Ambulatory Visit: Payer: Self-pay | Admitting: Physician Assistant

## 2022-09-10 ENCOUNTER — Other Ambulatory Visit: Payer: Self-pay | Admitting: Physician Assistant

## 2022-10-01 ENCOUNTER — Other Ambulatory Visit: Payer: Self-pay | Admitting: Physician Assistant

## 2022-10-22 ENCOUNTER — Other Ambulatory Visit: Payer: Self-pay | Admitting: Physician Assistant

## 2022-10-22 ENCOUNTER — Other Ambulatory Visit: Payer: Self-pay | Admitting: Cardiology

## 2022-10-28 ENCOUNTER — Other Ambulatory Visit: Payer: Self-pay | Admitting: Physician Assistant

## 2022-11-01 ENCOUNTER — Other Ambulatory Visit: Payer: Self-pay | Admitting: Physician Assistant

## 2022-11-12 ENCOUNTER — Other Ambulatory Visit: Payer: Self-pay | Admitting: Internal Medicine

## 2022-12-02 ENCOUNTER — Other Ambulatory Visit: Payer: Self-pay | Admitting: Cardiology

## 2022-12-02 ENCOUNTER — Other Ambulatory Visit: Payer: Self-pay | Admitting: Physician Assistant

## 2022-12-21 ENCOUNTER — Other Ambulatory Visit: Payer: Self-pay | Admitting: Physician Assistant

## 2022-12-21 ENCOUNTER — Other Ambulatory Visit: Payer: Self-pay | Admitting: Cardiology

## 2022-12-24 ENCOUNTER — Other Ambulatory Visit: Payer: Self-pay

## 2022-12-24 MED ORDER — FUROSEMIDE 40 MG PO TABS: 40.0000 mg | ORAL_TABLET | Freq: Every day | ORAL | 0 refills | Status: DC

## 2022-12-24 MED ORDER — METOPROLOL TARTRATE 25 MG PO TABS: ORAL_TABLET | ORAL | 0 refills | Status: DC

## 2022-12-26 ENCOUNTER — Telehealth: Payer: Self-pay | Admitting: Cardiology

## 2022-12-26 MED ORDER — FUROSEMIDE 40 MG PO TABS: 20.0000 mg | ORAL_TABLET | Freq: Every day | ORAL | 0 refills | Status: DC

## 2022-12-26 NOTE — Telephone Encounter (Signed)
Pt c/o medication issue:  1. Name of Medication: furosemide (LASIX) 40 MG tablet   metoprolol tartrate (LOPRESSOR) 25 MG tablet   2. How are you currently taking this medication (dosage and times per day)? Not sure   3. Are you having a reaction (difficulty breathing--STAT)? No   4. What is your medication issue? Pt is confused about dosage on these medications

## 2022-12-26 NOTE — Telephone Encounter (Signed)
Patient identification verified by 2 forms. Marilynn Rail, RN    Called and spoke to patient  Informed patient:   -Per chart review 8/11 OV Furosemide noted to be taken as 1/2 tablet of 40mg  daily   -all previous prescription have been sent for Furosemide 40mg  1/2 tablet daily   -no recent note from provider indicate change for medication   -take furosemide as previously written for 1/2 daily  Patient states:  -he took 1 whole tablet today, will take 1/2 tablet moving forward  -does not understanding how error occurred  -concerned about any medication errors  Advised patient can review all future prescriptions and call with concerns, keep 1/17 appointment for follow up  Patient verbalized understanding, no questions at this time   Updated prescription sent to pharmacy

## 2022-12-26 NOTE — Telephone Encounter (Signed)
Patient identification verified by 2 forms. Marilynn Rail, RN    Called and spoke to patient  Patient states:   -since 12/2020 he takes 1/2 tablet daily of lasix   -noticed recent change stating to take 1 tablet of Rx daily   -unsure when medication change occurred   -there were no changes to his medication is his knowledge   -has appointment scheduled for 02/07/23 Informed patient message sent to clarify dose for medication Informed patient RN will return call  Patient verbalized understanding

## 2023-01-08 ENCOUNTER — Other Ambulatory Visit: Payer: Self-pay | Admitting: Internal Medicine

## 2023-01-08 ENCOUNTER — Other Ambulatory Visit: Payer: Self-pay | Admitting: Cardiology

## 2023-01-28 ENCOUNTER — Other Ambulatory Visit: Payer: Self-pay | Admitting: Internal Medicine

## 2023-02-05 ENCOUNTER — Telehealth: Payer: Self-pay | Admitting: Internal Medicine

## 2023-02-05 MED ORDER — METOPROLOL TARTRATE 25 MG PO TABS: ORAL_TABLET | ORAL | 0 refills | Status: DC

## 2023-02-05 NOTE — Telephone Encounter (Signed)
*  STAT* If patient is at the pharmacy, call can be transferred to refill team.   1. Which medications need to be refilled? (please list name of each medication and dose if known)   metoprolol  tartrate (LOPRESSOR ) 25 MG tablet     4. Which pharmacy/location (including street and city if local pharmacy) is medication to be sent to? [facility] - [place], [place] - [address]     5. Do they need a 30 day or 90 day supply? 90

## 2023-02-07 ENCOUNTER — Ambulatory Visit: Payer: Medicare Other | Attending: Internal Medicine | Admitting: Internal Medicine

## 2023-02-07 VITALS — BP 124/62 | HR 64 | Ht 69.0 in | Wt 201.0 lb

## 2023-02-07 DIAGNOSIS — I4729 Other ventricular tachycardia: Secondary | ICD-10-CM | POA: Diagnosis not present

## 2023-02-07 DIAGNOSIS — I2510 Atherosclerotic heart disease of native coronary artery without angina pectoris: Secondary | ICD-10-CM

## 2023-02-07 DIAGNOSIS — I255 Ischemic cardiomyopathy: Secondary | ICD-10-CM

## 2023-02-07 DIAGNOSIS — I5022 Chronic systolic (congestive) heart failure: Secondary | ICD-10-CM | POA: Diagnosis not present

## 2023-02-07 LAB — CUP PACEART INCLINIC DEVICE CHECK
Battery Remaining Longevity: 46 mo
Brady Statistic RA Percent Paced: 5.4 %
Brady Statistic RV Percent Paced: 1.4 %
Date Time Interrogation Session: 20250117164254
HighPow Impedance: 61 Ohm
Implantable Lead Connection Status: 753985
Implantable Lead Connection Status: 753985
Implantable Lead Implant Date: 20190325
Implantable Lead Implant Date: 20190325
Implantable Lead Location: 753859
Implantable Lead Location: 753860
Implantable Lead Model: 7122
Implantable Pulse Generator Implant Date: 20190325
Lead Channel Impedance Value: 340 Ohm
Lead Channel Impedance Value: 580 Ohm
Lead Channel Pacing Threshold Amplitude: 0.75 V
Lead Channel Pacing Threshold Amplitude: 0.75 V
Lead Channel Pacing Threshold Pulse Width: 0.5 ms
Lead Channel Pacing Threshold Pulse Width: 0.5 ms
Lead Channel Sensing Intrinsic Amplitude: 0.9 mV
Lead Channel Sensing Intrinsic Amplitude: 12 mV
Lead Channel Setting Pacing Amplitude: 2.5 V
Lead Channel Setting Pacing Amplitude: 2.5 V
Lead Channel Setting Pacing Pulse Width: 0.5 ms
Lead Channel Setting Sensing Sensitivity: 0.5 mV
Pulse Gen Serial Number: 7387147
Zone Setting Status: 755011

## 2023-02-07 NOTE — Patient Instructions (Signed)

## 2023-02-07 NOTE — Progress Notes (Signed)
HPI Luis Donaldson for ongoing evaluation of chronic systolic heart failure, s/p ICD insertion. He is a pleasant 78 yo man with longstanding CAD, s/p CABG remotely. The patient developed worsening CHF and underwent left heart cath several years ago where he was found to have and EF of 25%, severe 3 vessel native CAD, and 2 of 3 patent grafts. He does not have angina and his CHF is class 3. He has not had syncope. No edema. He is on maximal medical therapy although his beta blocker has been limited by sinus bradycardia. He has not had syncope. I have not seen him in almost 6 years. Allergies  Allergen Reactions   Hydralazine Hcl Rash     Current Outpatient Medications  Medication Sig Dispense Refill   aspirin EC 81 MG EC tablet Take 1 tablet (81 mg total) by mouth daily. 30 tablet 11   atorvastatin (LIPITOR) 40 MG tablet Take 1 tablet by mouth once daily 30 tablet 0   furosemide (LASIX) 40 MG tablet TAKE 1 TABLET BY MOUTH ONCE DAILY . APPOINTMENT REQUIRED FOR FUTURE REFILLS 30 tablet 0   metFORMIN (GLUCOPHAGE) 500 MG tablet Take 500 mg by mouth 2 (two) times daily.     metoprolol tartrate (LOPRESSOR) 25 MG tablet TAKE 1 TABLET BY MOUTH TWICE DAILY . APPOINTMENT REQUIRED FOR FUTURE REFILLS 60 tablet 0   Multiple Vitamin (MULTIVITAMIN) tablet Take 1 tablet by mouth daily.     nitroGLYCERIN (NITROSTAT) 0.4 MG SL tablet Place 1 tablet (0.4 mg total) under the tongue every 5 (five) minutes x 3 doses as needed for chest pain. 25 tablet 3   valsartan (DIOVAN) 160 MG tablet TAKE 1 TABLET BY MOUTH ONCE DAILY . APPOINTMENT REQUIRED FOR FUTURE REFILLS 90 tablet 0   No current facility-administered medications for this visit.     Past Medical History:  Diagnosis Date   Coronary artery disease    Hyperlipidemia    Hypertension    MI (myocardial infarction) (HCC)     ROS:   All systems reviewed and negative except as noted in the HPI.   Past Surgical History:  Procedure Laterality Date    CORONARY ARTERY BYPASS GRAFT     ICD IMPLANT N/A 04/14/2017   Procedure: ICD IMPLANT;  Surgeon: Marinus Maw, MD;  Location: [facility] INVASIVE CV LAB;  Service: Cardiovascular;  Laterality: N/A;   LEFT HEART CATH AND CORONARY ANGIOGRAPHY N/A 12/16/2016   Procedure: LEFT HEART CATH AND CORONARY ANGIOGRAPHY;  Surgeon: Swaziland, Peter M, MD;  Location: [facility] INVASIVE CV LAB;  Service: Cardiovascular;  Laterality: N/A;     Family History  Problem Relation Age of Onset   Heart disease Mother      Social History   Socioeconomic History   Marital status: Divorced    Spouse name: [name]   Number of children: [name]   Years of education: [name]   Highest education level: [name]  Occupational History   [name]  Tobacco Use   Smoking status: Former    Current packs/day: 0.00    Types: Cigarettes    Quit date: 12/11/2016    Years since quitting: 6.1   Smokeless tobacco: Never  Vaping Use   Vaping status: Never Used  Substance and Sexual Activity   Alcohol use: No   Drug use: No   Sexual activity: [name]  Other Topics Concern   [name]  Social History Narrative  [name]   Social Drivers of Health   Financial Resource Strain: Medium Risk (01/25/2020)   Overall Financial Resource Strain (CARDIA)    Difficulty of Paying Living Expenses: Somewhat hard  Food Insecurity: Unknown (04/14/2017)   Hunger Vital Sign    Worried About Running Out of Food in the Last Year: Patient declined    Ran Out of Food in the Last Year: Patient declined  Transportation Needs: No Transportation Needs (01/25/2020)   PRAPARE - Administrator, Civil Service (Medical): No    Lack of Transportation (Non-Medical): No  Recent Concern: Transportation Needs - Unmet Transportation Needs (01/24/2020)   PRAPARE - Transportation    Lack of Transportation (Medical): Yes    Lack of Transportation (Non-Medical): Yes  Physical Activity: Unknown (04/14/2017)   Exercise Vital Sign     Days of Exercise per Week: Patient declined    Minutes of Exercise per Session: Patient declined  Stress: No Stress Concern Present (04/14/2017)   [employer] of Occupational Health - Occupational Stress Questionnaire    Feeling of Stress : Not at all  Social Connections: Unknown (04/14/2017)   Social Connection and Isolation Panel [NHANES]    Frequency of Communication with Friends and Family: Patient declined    Frequency of Social Gatherings with Friends and Family: Patient declined    Attends Religious Services: Patient declined    Database administrator or Organizations: Patient declined    Attends Banker Meetings: Patient declined    Marital Status: Patient declined  Intimate Partner Violence: Unknown (04/14/2017)   Humiliation, Afraid, Rape, and Kick questionnaire    Fear of Current or Ex-Partner: Patient declined    Emotionally Abused: Patient declined    Physically Abused: Patient declined    Sexually Abused: Patient declined     BP 124/62   Pulse 64   Ht 5\' 9"  (1.753 m)   Wt 201 lb (91.2 kg)   SpO2 95%   BMI 29.68 kg/m   Physical Exam:  Well appearing NAD HEENT: Unremarkable Neck:  No JVD, no thyromegally Lymphatics:  No adenopathy Back:  No CVA tenderness Lungs:  Clear with no wheezes HEART:  Regular rate rhythm, no murmurs, no rubs, no clicks Abd:  soft, positive bowel sounds, no organomegally, no rebound, no guarding Ext:  2 plus pulses, no edema, no cyanosis, no clubbing Skin:  No rashes no nodules Neuro:  CN II through XII intact, motor grossly intact  EKG - nsr with anterior MI  DEVICE  Normal device function.  See PaceArt for details.   Assess/Plan:  1. Chronic systolic heart failure - he is on maximal medical therapy with beta blocker and ACE inhibitor and his EF is low. He has class 2 symptoms and will continue his current GDMT.   2. CAD - he has severe CAD with mostly patent grafts. He denies anginal symptoms. He will  continue his current meds. 3. Sinus node dysfunction - he has significant bradycardia and is s/p DDD ICD at implant. 4. ICD - his St. Jude DDD ICD is working normally.    Leonia Reeves.D.

## 2023-02-25 ENCOUNTER — Other Ambulatory Visit: Payer: Self-pay | Admitting: Internal Medicine

## 2023-02-26 ENCOUNTER — Other Ambulatory Visit: Payer: Self-pay

## 2023-02-26 MED ORDER — ATORVASTATIN CALCIUM 40 MG PO TABS: 40.0000 mg | ORAL_TABLET | Freq: Every day | ORAL | 3 refills | Status: DC

## 2023-03-05 ENCOUNTER — Other Ambulatory Visit: Payer: Self-pay | Admitting: Internal Medicine

## 2023-04-21 ENCOUNTER — Other Ambulatory Visit: Payer: Self-pay | Admitting: Internal Medicine

## 2023-07-07 ENCOUNTER — Encounter

## 2023-08-29 ENCOUNTER — Other Ambulatory Visit: Payer: Self-pay | Admitting: Cardiology

## 2023-08-29 ENCOUNTER — Other Ambulatory Visit: Payer: Self-pay | Admitting: Internal Medicine

## 2023-09-03 ENCOUNTER — Other Ambulatory Visit: Payer: Self-pay | Admitting: Internal Medicine

## 2023-09-03 ENCOUNTER — Telehealth: Payer: Self-pay | Admitting: Cardiology

## 2023-09-03 MED ORDER — METOPROLOL TARTRATE 25 MG PO TABS
ORAL_TABLET | ORAL | 0 refills | Status: DC
Start: 2023-09-03 — End: 2023-10-13

## 2023-09-03 NOTE — Telephone Encounter (Signed)
*  STAT* If patient is at the pharmacy, call can be transferred to refill team.   1. Which medications need to be refilled? (please list name of each medication and dose if known)   metoprolol  tartrate (LOPRESSOR ) 25 MG tablet   2. Would you like to learn more about the convenience, safety, & potential cost savings by using the [facility]?   3. Are you open to using the [facility] (Type [facility]. ).  4. Which pharmacy/location (including street and city if local pharmacy) is medication to be sent to?  [facility] - [place], [place] - [address]   5. Do they need a 30 day or 90 day supply?   90 day  Caller Kayleen) stated patient is completely out of this medication.

## 2023-09-03 NOTE — Telephone Encounter (Signed)
 Spoke to patient advised he is past due to see Dr.Jordan.Appointment scheduled with Dr.Jordan 9/22 at 3:20 pm.Metoprolol  refill sent to his pharmacy.

## 2023-09-03 NOTE — Telephone Encounter (Signed)
 Pt of Dr. Swaziland. Last OV was in 3/23 as a video. This RX for Metoprolol  was refilled under Dr. Adrian name this year. Please address.

## 2023-10-06 ENCOUNTER — Encounter

## 2023-10-06 NOTE — Progress Notes (Signed)
 Cardiology Office Note:    Date:  10/13/2023   ID:  Luis Donaldson, DOB October 10, 1945, MRN [id number]  PCP:  Vicci Barnie NOVAK, MD   [facility] Providers Cardiologist:  Jocabed Cheese Swaz[place], MD     Referring MD: Vicci Barnie NOVAK, MD   Chief Complaint  Patient presents with   Congestive Heart Failure   Coronary Artery Disease    History of Present Illness:    Luis Donaldson is a 78 y.o. male  with PMH of CAD, ischemic cardiomyopathy s/p ICD,  HTN, HLD and DM II.  He had a remote PCI in the 90s and then CABG in 2000 at [facility].  He went back to the hospital in November 2018 with chest pain, respiratory failure, hypertensive emergency and persistent right of SVT.  There was some concern of STEMI with ST elevation in V1-V2.  He was intubated and underwent emergent cardiac catheterization which did not reveal any culprit lesion.  It showed 2 patent grafts with chronic RCA graft occlusion with collaterals, EF 25 to 30% with grade 1 DD.  Medical therapy was recommended.  Patient was readmitted in December 2018 with bradycardia, frequent urination and repetitive runs of SVT and was started on amiodarone . Amlodipine  was added due to elevated blood pressure.  Repeat echocardiogram in March 2019 showed EF 30 to 35%, he was referred to EP for ICD implantation.  He eventually underwent ICD implantation by Dr. Waddell on 04/14/2017.   Amiodarone  was stopped due to elevated TSH and liver function.  He does have a history of allergic reaction with hydralazine .  Since the last visit, he has been diagnosed with diabetes. He was previously not a candidate for Entresto or Jardiance due to cost.   He was last seen by me for a virtual visit in March 2023. He has been followed by EP for his ICD.   He reports he is weak. Also notes swelling in his ankles over the past month. Denies any chest pain or dyspnea. He has been taking lasix  only PRN  Past Medical History:  Diagnosis Date   Coronary artery disease     Hyperlipidemia    Hypertension    MI (myocardial infarction) [facility])     Past Surgical History:  Procedure Laterality Date   CORONARY ARTERY BYPASS GRAFT     ICD IMPLANT N/A 04/14/2017   Procedure: ICD IMPLANT;  Surgeon: Waddell Danelle ORN, MD;  Location: [facility] INVASIVE CV LAB;  Service: Cardiovascular;  Laterality: N/A;   LEFT HEART CATH AND CORONARY ANGIOGRAPHY N/A 12/16/2016   Procedure: LEFT HEART CATH AND CORONARY ANGIOGRAPHY;  Surgeon: Swaziland, Jamin Humphries M, MD;  Location: [facility] INVASIVE CV LAB;  Service: Cardiovascular;  Laterality: N/A;    Current Medications: Current Meds  Medication Sig   aspirin  EC 81 MG EC tablet Take 1 tablet (81 mg total) by mouth daily.   atorvastatin  (LIPITOR ) 80 MG tablet Take 1 tablet (80 mg total) by mouth daily.   enzalutamide (XTANDI) 40 MG capsule Take 160 mg by mouth daily.   glipiZIDE-metformin (METAGLIP) 2.5-500 MG tablet Take 1 tablet by mouth 2 (two) times daily.   metoprolol  succinate (TOPROL -XL) 50 MG 24 hr tablet Take 1 tablet (50 mg total) by mouth daily. Take with or immediately following a meal.   Multiple Vitamin (MULTIVITAMIN) tablet Take 1 tablet by mouth daily.   tamsulosin (FLOMAX) 0.4 MG CAPS capsule Take 0.4 mg by mouth at bedtime.   [DISCONTINUED] atorvastatin  (LIPITOR ) 40 MG tablet Take 1  tablet (40 mg total) by mouth daily.   [DISCONTINUED] furosemide  (LASIX ) 40 MG tablet TAKE 1 TABLET BY MOUTH ONCE DAILY . APPOINTMENT REQUIRED FOR FUTURE REFILLS (Patient taking differently: Take 20 mg by mouth daily.)   [DISCONTINUED] metoprolol  tartrate (LOPRESSOR ) 25 MG tablet TAKE 1 TABLET BY MOUTH TWICE DAILY   [DISCONTINUED] valsartan  (DIOVAN ) 160 MG tablet TAKE 1 TABLET BY MOUTH ONCE DAILY . APPOINTMENT REQUIRED FOR FUTURE REFILLS     Allergies:   Hydralazine  hcl   Social History   Socioeconomic History   Marital status: Divorced    Spouse name: [name]   Number of children: [name]   Years of education: [name]   Highest education  level: [name]  Occupational History   [name]  Tobacco Use   Smoking status: Former    Current packs/day: 0.00    Types: Cigarettes    Quit date: 12/11/2016    Years since quitting: 6.8   Smokeless tobacco: Never  Vaping Use   Vaping status: Never Used  Substance and Sexual Activity   Alcohol use: No   Drug use: No   Sexual activity: [name]  Other Topics Concern   [name]  Social History Narrative   [name]   Social Drivers of Health   Financial Resource Strain: Medium Risk (01/25/2020)   Overall Financial Resource Strain (CARDIA)    Difficulty of Paying Living Expenses: Somewhat hard  Food Insecurity: Unknown (04/14/2017)   Hunger Vital Sign    Worried About Running Out of Food in the Last Year: Patient declined    Ran Out of Food in the Last Year: Patient declined  Transportation Needs: No Transportation Needs (01/25/2020)   PRAPARE - Administrator, Civil Service (Medical): No    Lack of Transportation (Non-Medical): No  Recent Concern: Transportation Needs - Unmet Transportation Needs (01/24/2020)   PRAPARE - Transportation    Lack of Transportation (Medical): Yes    Lack of Transportation (Non-Medical): Yes  Physical Activity: Unknown (04/14/2017)   Exercise Vital Sign    Days of Exercise per Week: Patient declined    Minutes of Exercise per Session: Patient declined  Stress: No Stress Concern Present (04/14/2017)   [employer] of Occupational Health - Occupational Stress Questionnaire    Feeling of Stress : Not at all  Social Connections: Unknown (04/14/2017)   Social Connection and Isolation Panel    Frequency of Communication with Friends and Family: Patient declined    Frequency of Social Gatherings with Friends and Family: Patient declined    Attends Religious Services: Patient declined    Database administrator or Organizations: Patient declined    Attends Banker Meetings: Patient declined    Marital Status:  Patient declined     Family History: The patient's family history includes Heart disease in his mother.  ROS:   Please see the history of present illness.     All other systems reviewed and are negative.  EKGs/Labs/Other Studies Reviewed:    The following studies were reviewed today:       Recent Labs: No results found for requested labs within last 365 days.  Recent Lipid Panel    Component Value Date/Time   CHOL 133 03/05/2017 0948   TRIG 140 03/05/2017 0948   HDL 35 (L) 03/05/2017 0948   CHOLHDL 6.5 12/17/2016 0629   VLDL 46 (H) 12/17/2016 0629   LDLCALC 70 03/05/2017 9051  Risk Assessment/Calculations:                Physical Exam:    VS:  BP (!) 114/50 (BP Location: Left Arm, Cuff Size: Large)   Pulse (!) 58   Ht 5' 9 (1.753 m)   Wt 196 lb 1.6 oz (89 kg)   SpO2 96%   BMI 28.96 kg/m     Wt Readings from Last 3 Encounters:  10/13/23 196 lb 1.6 oz (89 kg)  02/07/23 201 lb (91.2 kg)  08/31/21 192 lb 9.6 oz (87.4 kg)     GEN:  Well nourished, well developed in no acute distress HEENT: Normal NECK: No JVD; No carotid bruits LYMPHATICS: No lymphadenopathy CARDIAC: RRR, no murmurs, rubs, gallops RESPIRATORY:  Clear to auscultation without rales, wheezing or rhonchi  ABDOMEN: Soft, non-tender, non-distended MUSCULOSKELETAL:  1-2 + edema; No deformity  SKIN: Warm and dry NEUROLOGIC:  Alert and oriented x 3 PSYCHIATRIC:  Normal affect   ASSESSMENT:    1. Chronic systolic (congestive) heart failure (HCC)   2. Ischemic cardiomyopathy   3. Coronary artery disease of bypass graft of native heart with stable angina pectoris (HCC)   4. Hypercholesteremia   5. NSVT (nonsustained ventricular tachycardia) (HCC)    PLAN:    In order of problems listed above:  Chronic systolic CHF due to ischemic cardiomyopathy. Last EF 30-35% prior to ICD. Has not been reassessed in 6 years. Now with some edema and weakness. Will update Echo. Will continue Diovan  160  mg daily. Consolidate metoprolol  to Toprol  XL 50 mg daily. Take lasix  20 mg daily. Restrict sodium. Will follow up after Echo to reassess GDMT. May consider switching to Entresto and adding SGLT 2 inhibitor.  S/p ICD for prevention. Followed by EP.  CAD extensive history as noted in HPI. S/p prior PCIs and CABG. No active angina. Continue metoprolol , ASA, statin HLD. Goal LDL < 55. Will increase lipitor  to 80 mg daily. Repeat lab in 2 months.  DM controlled.  Prostate CA metastatic to bone. S/p RT and on Xtandi           Medication Adjustments/Labs and Tests Ordered: Current medicines are reviewed at length with the patient today.  Concerns regarding medicines are outlined above.  Orders Placed This Encounter  Procedures   Comp Met (CMET)   Lipid panel   ECHOCARDIOGRAM COMPLETE   Meds ordered this encounter  Medications   atorvastatin  (LIPITOR ) 80 MG tablet    Sig: Take 1 tablet (80 mg total) by mouth daily.    Dispense:  90 tablet    Refill:  3   furosemide  (LASIX ) 40 MG tablet    Sig: Take 1 tablet (40 mg total) by mouth daily.    Dispense:  90 tablet    Refill:  3    Please keep pending appt on 9/22 w Dr Swaziland for further refills. Thank you Final Attempt   metoprolol  succinate (TOPROL -XL) 50 MG 24 hr tablet    Sig: Take 1 tablet (50 mg total) by mouth daily. Take with or immediately following a meal.    Dispense:  90 tablet    Refill:  3   valsartan  (DIOVAN ) 160 MG tablet    Sig: TAKE 1 TABLET BY MOUTH ONCE DAILY . APPOINTMENT REQUIRED FOR FUTURE REFILLS    Dispense:  90 tablet    Refill:  3    Patient Instructions  Medication Instructions:  Increase Atorvastatin  to 80 mg daily Take Metoprolol  Succinate 50 mg daily  Continue all other medications *If you need a refill on your cardiac medications before your next appointment, please call your pharmacy*  Lab Work: Have fasting lipid panel and cmet 2 to 3 days before next appointment   Testing/Procedures: Echo      first available   Follow-Up: At [facility], you and your health needs are our priority.  As part of our continuing mission to provide you with exceptional heart care, our providers are all part of one team.  This team includes your primary Cardiologist (physician) and Advanced Practice Providers or APPs (Physician Assistants and Nurse Practitioners) who all work together to provide you with the care you need, when you need it.  Your next appointment:  2 months  after Echo    Provider:  Dr.Baylor Cortez or PA    We recommend signing up for the patient portal called MyChart.  Sign up information is provided on this After Visit Summary.  MyChart is used to connect with patients for Virtual Visits (Telemedicine).  Patients are able to view lab/test results, encounter notes, upcoming appointments, etc.  Non-urgent messages can be sent to your provider as well.   To learn more about what you can do with MyChart, go to [email or web address].           Signed, Jenisis Harmsen [place], MD  10/13/2023 5:05 PM    [facility]

## 2023-10-08 ENCOUNTER — Other Ambulatory Visit: Payer: Self-pay | Admitting: Cardiology

## 2023-10-13 ENCOUNTER — Encounter: Payer: Self-pay | Admitting: Cardiology

## 2023-10-13 ENCOUNTER — Ambulatory Visit: Attending: Cardiology | Admitting: Cardiology

## 2023-10-13 VITALS — BP 114/50 | HR 58 | Ht 69.0 in | Wt 196.1 lb

## 2023-10-13 DIAGNOSIS — E7800 Pure hypercholesterolemia, unspecified: Secondary | ICD-10-CM

## 2023-10-13 DIAGNOSIS — I4729 Other ventricular tachycardia: Secondary | ICD-10-CM

## 2023-10-13 DIAGNOSIS — I255 Ischemic cardiomyopathy: Secondary | ICD-10-CM | POA: Diagnosis not present

## 2023-10-13 DIAGNOSIS — I25708 Atherosclerosis of coronary artery bypass graft(s), unspecified, with other forms of angina pectoris: Secondary | ICD-10-CM

## 2023-10-13 DIAGNOSIS — I5022 Chronic systolic (congestive) heart failure: Secondary | ICD-10-CM | POA: Diagnosis not present

## 2023-10-13 MED ORDER — FUROSEMIDE 40 MG PO TABS: 40.0000 mg | ORAL_TABLET | Freq: Every day | ORAL | 3 refills | Status: AC

## 2023-10-13 MED ORDER — VALSARTAN 160 MG PO TABS: ORAL_TABLET | ORAL | 3 refills | Status: DC

## 2023-10-13 MED ORDER — ATORVASTATIN CALCIUM 80 MG PO TABS: 80.0000 mg | ORAL_TABLET | Freq: Every day | ORAL | 3 refills | Status: AC

## 2023-10-13 MED ORDER — METOPROLOL SUCCINATE ER 50 MG PO TB24: 50.0000 mg | ORAL_TABLET | Freq: Every day | ORAL | 3 refills | Status: AC

## 2023-10-13 NOTE — Patient Instructions (Addendum)
 Medication Instructions:  Increase Atorvastatin  to 80 mg daily Take Metoprolol  Succinate 50 mg daily Continue all other medications *If you need a refill on your cardiac medications before your next appointment, please call your pharmacy*  Lab Work: Have fasting lipid panel and cmet 2 to 3 days before next appointment   Testing/Procedures: Echo     first available   Follow-Up: At [facility], you and your health needs are our priority.  As part of our continuing mission to provide you with exceptional heart care, our providers are all part of one team.  This team includes your primary Cardiologist (physician) and Advanced Practice Providers or APPs (Physician Assistants and Nurse Practitioners) who all work together to provide you with the care you need, when you need it.  Your next appointment:  2 months  after Echo    Provider:  Dr.Jordan or PA    We recommend signing up for the patient portal called MyChart.  Sign up information is provided on this After Visit Summary.  MyChart is used to connect with patients for Virtual Visits (Telemedicine).  Patients are able to view lab/test results, encounter notes, upcoming appointments, etc.  Non-urgent messages can be sent to your provider as well.   To learn more about what you can do with MyChart, go to [email or web address].

## 2023-10-28 ENCOUNTER — Other Ambulatory Visit: Payer: Self-pay | Admitting: Cardiology

## 2023-11-11 ENCOUNTER — Ambulatory Visit: Payer: Self-pay | Admitting: Cardiology

## 2023-11-11 LAB — COMPREHENSIVE METABOLIC PANEL WITH GFR
ALT: 11 IU/L (ref 0–44)
AST: 17 IU/L (ref 0–40)
Albumin: 4.1 g/dL (ref 3.8–4.8)
Alkaline Phosphatase: 151 IU/L — ABNORMAL HIGH (ref 47–123)
BUN/Creatinine Ratio: 27 — ABNORMAL HIGH (ref 10–24)
BUN: 31 mg/dL — ABNORMAL HIGH (ref 8–27)
Bilirubin Total: 0.5 mg/dL (ref 0.0–1.2)
CO2: 20 mmol/L (ref 20–29)
Calcium: 9.4 mg/dL (ref 8.6–10.2)
Chloride: 105 mmol/L (ref 96–106)
Creatinine, Ser: 1.16 mg/dL (ref 0.76–1.27)
Globulin, Total: 2.4 g/dL (ref 1.5–4.5)
Glucose: 159 mg/dL — ABNORMAL HIGH (ref 70–99)
Potassium: 5.1 mmol/L (ref 3.5–5.2)
Sodium: 138 mmol/L (ref 134–144)
Total Protein: 6.5 g/dL (ref 6.0–8.5)
eGFR: 65 mL/min/1.73 (ref >59)

## 2023-11-11 LAB — LIPID PANEL
Chol/HDL Ratio: 3.1 ratio (ref 0.0–5.0)
Cholesterol, Total: 133 mg/dL (ref 100–199)
HDL: 43 mg/dL (ref >39)
LDL Chol Calc (NIH): 75 mg/dL (ref 0–99)
Triglycerides: 73 mg/dL (ref 0–149)
VLDL Cholesterol Cal: 15 mg/dL (ref 5–40)

## 2023-11-12 ENCOUNTER — Other Ambulatory Visit: Payer: Self-pay

## 2023-11-12 DIAGNOSIS — E7800 Pure hypercholesterolemia, unspecified: Secondary | ICD-10-CM

## 2023-11-19 ENCOUNTER — Telehealth: Payer: Self-pay

## 2023-11-19 ENCOUNTER — Ambulatory Visit (HOSPITAL_COMMUNITY)
Admission: RE | Admit: 2023-11-19 | Discharge: 2023-11-19 | Disposition: A | Discharge status: Home / Self Care | Source: Ambulatory Visit | Attending: Cardiology | Admitting: Cardiology

## 2023-11-19 DIAGNOSIS — E7800 Pure hypercholesterolemia, unspecified: Secondary | ICD-10-CM | POA: Diagnosis present

## 2023-11-19 DIAGNOSIS — I255 Ischemic cardiomyopathy: Secondary | ICD-10-CM | POA: Insufficient documentation

## 2023-11-19 DIAGNOSIS — I5022 Chronic systolic (congestive) heart failure: Secondary | ICD-10-CM | POA: Diagnosis not present

## 2023-11-19 DIAGNOSIS — I25708 Atherosclerosis of coronary artery bypass graft(s), unspecified, with other forms of angina pectoris: Secondary | ICD-10-CM | POA: Insufficient documentation

## 2023-11-19 DIAGNOSIS — I4729 Other ventricular tachycardia: Secondary | ICD-10-CM | POA: Insufficient documentation

## 2023-11-19 LAB — ECHOCARDIOGRAM COMPLETE
AR max vel: 1.72 cm2
AV Area VTI: 1.78 cm2
AV Area mean vel: 1.76 cm2
AV Mean grad: 5.4 mmHg
AV Peak grad: 9.5 mmHg
Ao pk vel: 1.54 m/s
Area-P 1/2: 4.8 cm2
S' Lateral: 4.4 cm

## 2023-11-19 MED ORDER — PERFLUTREN LIPID MICROSPHERE
1.0000 mL | INTRAVENOUS | Status: AC | PRN
  Administered 2023-11-19: 1 mL via INTRAVENOUS

## 2023-11-19 NOTE — Telephone Encounter (Signed)
 Pt came to 5th floor after echo to report that his bedside monitor-one of the lights did not work this morning and he is concerned it's not working.  Per review of [employer], Pt has not sent a remote transmission since October 2024.  Gave Pt number to [employer] to call and advise he needs a new monitor.  Pt thanked nurse for assistance.

## 2023-11-20 ENCOUNTER — Ambulatory Visit: Payer: Self-pay | Admitting: Cardiology

## 2023-11-20 LAB — HEPATIC FUNCTION PANEL
ALT: 9 IU/L (ref 0–44)
AST: 14 IU/L (ref 0–40)
Albumin: 4.2 g/dL (ref 3.8–4.8)
Alkaline Phosphatase: 147 IU/L — ABNORMAL HIGH (ref 47–123)
Bilirubin Total: 0.3 mg/dL (ref 0.0–1.2)
Bilirubin, Direct: 0.12 mg/dL (ref 0.00–0.40)
Total Protein: 6.7 g/dL (ref 6.0–8.5)

## 2023-11-20 LAB — LIPID PANEL
Chol/HDL Ratio: 3.3 ratio (ref 0.0–5.0)
Cholesterol, Total: 144 mg/dL (ref 100–199)
HDL: 43 mg/dL (ref >39)
LDL Chol Calc (NIH): 84 mg/dL (ref 0–99)
Triglycerides: 90 mg/dL (ref 0–149)
VLDL Cholesterol Cal: 17 mg/dL (ref 5–40)

## 2023-12-05 NOTE — Progress Notes (Signed)
 Cardiology Office Note:    Date:  12/11/2023   ID:  Luis Donaldson, DOB [birth year], MRN [id number]  PCP:  Vicci Barnie NOVAK, MD   [facility] Providers Cardiologist:  Saahas Hidrogo, MD     Referring MD: Vicci Barnie NOVAK, MD   No chief complaint on file.   History of Present Illness:    Luis Donaldson is a 78 y.o. male  with PMH of CAD, ischemic cardiomyopathy s/p ICD,  HTN, HLD and DM II.  He had a remote PCI in the 90s and then CABG in 2000 at [facility]  He went back to the hospital in November 2018 with chest pain, respiratory failure, hypertensive emergency and persistent right of SVT.  There was some concern of STEMI with ST elevation in V1-V2.  He was intubated and underwent emergent cardiac catheterization which did not reveal any culprit lesion.  It showed 2 patent grafts with chronic RCA graft occlusion with collaterals, EF 25 to 30% with grade 1 DD.  Medical therapy was recommended.  Patient was readmitted in December 2018 with bradycardia, frequent urination and repetitive runs of SVT and was started on amiodarone . Amlodipine  was added due to elevated blood pressure.  Repeat echocardiogram in March 2019 showed EF 30 to 35%, he was referred to EP for ICD implantation.  He eventually underwent ICD implantation by Dr. Waddell on 04/14/2017.   Amiodarone  was stopped due to elevated TSH and liver function.  He does have a history of allergic reaction with hydralazine .  Since the last visit, he has been diagnosed with diabetes. He was previously not a candidate for Entresto or Jardiance due to cost.   He was last seen by me for a virtual visit in March 2023. He has been followed by EP for his ICD.   More recently he had repeat Echo showing EF 25-30%. Seen to discuss optimizing his GDMT for CHF   Past Medical History:  Diagnosis Date   Coronary artery disease    Hyperlipidemia    Hypertension    MI (myocardial infarction) [facility])     Past Surgical History:  Procedure  Laterality Date   CORONARY ARTERY BYPASS GRAFT     ICD IMPLANT N/A 04/14/2017   Procedure: ICD IMPLANT;  Surgeon: Waddell Danelle ORN, MD;  Location: [facility] INVASIVE CV LAB;  Service: Cardiovascular;  Laterality: N/A;   LEFT HEART CATH AND CORONARY ANGIOGRAPHY N/A 12/16/2016   Procedure: LEFT HEART CATH AND CORONARY ANGIOGRAPHY;  Surgeon: Amisadai Woodford M, MD;  Location: [facility] INVASIVE CV LAB;  Service: Cardiovascular;  Laterality: N/A;    Current Medications: Current Meds  Medication Sig   aspirin  EC 81 MG EC tablet Take 1 tablet (81 mg total) by mouth daily.   empagliflozin (JARDIANCE) 10 MG TABS tablet Take 1 tablet (10 mg total) by mouth daily before breakfast.   furosemide  (LASIX ) 40 MG tablet Take 1 tablet (40 mg total) by mouth daily.   glipiZIDE-metformin (METAGLIP) 2.5-500 MG tablet Take 1 tablet by mouth 2 (two) times daily.   metoprolol  succinate (TOPROL -XL) 50 MG 24 hr tablet Take 1 tablet (50 mg total) by mouth daily. Take with or immediately following a meal.   Multiple Vitamin (MULTIVITAMIN) tablet Take 1 tablet by mouth daily.   nitroGLYCERIN  (NITROSTAT ) 0.4 MG SL tablet Place 1 tablet (0.4 mg total) under the tongue every 5 (five) minutes x 3 doses as needed for chest pain.   sacubitril-valsartan  (ENTRESTO) 97-103 MG Take 1 tablet by mouth 2 (  two) times daily.   tamsulosin (FLOMAX) 0.4 MG CAPS capsule Take 0.4 mg by mouth at bedtime.   [DISCONTINUED] valsartan  (DIOVAN ) 160 MG tablet TAKE 1 TABLET BY MOUTH ONCE DAILY . APPOINTMENT REQUIRED FOR FUTURE REFILLS     Allergies:   Hydralazine  hcl   Social History   Socioeconomic History   Marital status: Divorced    Spouse name: [name]   Number of children: [name]   Years of education: [name]   Highest education level: [name]  Occupational History   [name]  Tobacco Use   Smoking status: Former    Current packs/day: 0.00    Types: Cigarettes    Quit date: 12/11/2016    Years since quitting: 7.0    Smokeless tobacco: Never  Vaping Use   Vaping status: Never Used  Substance and Sexual Activity   Alcohol use: No   Drug use: No   Sexual activity: [name]  Other Topics Concern   [name]  Social History Narrative   [name]   Social Drivers of Health   Financial Resource Strain: Medium Risk (01/25/2020)   Overall Financial Resource Strain (CARDIA)    Difficulty of Paying Living Expenses: Somewhat hard  Food Insecurity: Unknown (04/14/2017)   Hunger Vital Sign    Worried About Running Out of Food in the Last Year: Patient declined    Ran Out of Food in the Last Year: Patient declined  Transportation Needs: No Transportation Needs (01/25/2020)   PRAPARE - Administrator, Civil Service (Medical): No    Lack of Transportation (Non-Medical): No  Recent Concern: Transportation Needs - Unmet Transportation Needs (01/24/2020)   PRAPARE - Transportation    Lack of Transportation (Medical): Yes    Lack of Transportation (Non-Medical): Yes  Physical Activity: Unknown (04/14/2017)   Exercise Vital Sign    Days of Exercise per Week: Patient declined    Minutes of Exercise per Session: Patient declined  Stress: No Stress Concern Present (04/14/2017)   [employer] of Occupational Health - Occupational Stress Questionnaire    Feeling of Stress : Not at all  Social Connections: Unknown (04/14/2017)   Social Connection and Isolation Panel    Frequency of Communication with Friends and Family: Patient declined    Frequency of Social Gatherings with Friends and Family: Patient declined    Attends Religious Services: Patient declined    Database Administrator or Organizations: Patient declined    Attends Banker Meetings: Patient declined    Marital Status: Patient declined     Family History: The patient's family history includes Heart disease in his mother.  ROS:   Please see the history of present illness.     All other systems reviewed and are  negative.  EKGs/Labs/Other Studies Reviewed:    The following studies were reviewed today:      Echo 11/19/23: IMPRESSIONS     1. Left ventricular ejection fraction, by estimation, is 25 to 30%. The  left ventricle has severely decreased function. The left ventricle  demonstrates regional wall motion abnormalities (see scoring  diagram/findings for description). The left  ventricular internal cavity size was mildly dilated. Left ventricular  diastolic parameters were normal.   2. Right ventricular systolic function is mildly reduced. The right  ventricular size is moderately enlarged.   3. Left atrial size was mildly dilated.   4. The mitral valve is normal in structure. Trivial  mitral valve  regurgitation.   5. The aortic valve is calcified. Aortic valve regurgitation is not  visualized. Aortic valve sclerosis is present, with no evidence of aortic  valve stenosis.   6. Aortic dilatation noted. There is mild dilatation of the ascending  aorta, measuring 40 mm.   Comparison(s): Unable to view 2019 study for comparison.   Recent Labs: 11/10/2023: BUN 31; Creatinine, Ser 1.16; Potassium 5.1; Sodium 138 11/19/2023: ALT 9  Recent Lipid Panel    Component Value Date/Time   CHOL 144 11/19/2023 1253   TRIG 90 11/19/2023 1253   HDL 43 11/19/2023 1253   CHOLHDL 3.3 11/19/2023 1253   CHOLHDL 6.5 12/17/2016 0629   VLDL 46 (H) 12/17/2016 0629   LDLCALC 84 11/19/2023 1253     Risk Assessment/Calculations:                Physical Exam:    VS:  BP 132/60   Pulse 60   Ht 5' 9 (1.753 m)   Wt 200 lb (90.7 kg)   SpO2 98%   BMI 29.53 kg/m     Wt Readings from Last 3 Encounters:  12/11/23 200 lb (90.7 kg)  10/13/23 196 lb 1.6 oz (89 kg)  02/07/23 201 lb (91.2 kg)     GEN:  Well nourished, well developed in no acute distress HEENT: Normal NECK: No JVD; No carotid bruits LYMPHATICS: No lymphadenopathy CARDIAC: RRR, no murmurs, rubs, gallops RESPIRATORY:  Clear to  auscultation without rales, wheezing or rhonchi  ABDOMEN: Soft, non-tender, non-distended MUSCULOSKELETAL:  1-2 + edema; No deformity  SKIN: Warm and dry NEUROLOGIC:  Alert and oriented x 3 PSYCHIATRIC:  Normal affect   ASSESSMENT:    1. Chronic systolic (congestive) heart failure (HCC)   2. Ischemic cardiomyopathy   3. Coronary artery disease of bypass graft of native heart with stable angina pectoris   4. Hypercholesteremia     PLAN:    In order of problems listed above:  Chronic systolic CHF due to ischemic cardiomyopathy. EF down to 25-30%. ICD in place. Recommend switching valsartan  to Entresto 97/103 mg bid. Add Jardiance 10 mg daily.  On Toprol  XL 50 mg daily. Take lasix  20 mg daily. Restrict sodium. Follow up in 3 months.  S/p ICD for prevention. Followed by EP. Due for remote check CAD extensive history as noted in HPI. S/p prior PCIs and CABG. No active angina. Continue metoprolol , ASA, statin HLD. Goal LDL < 55. Will increase lipitor  to 80 mg daily.  DM controlled.  Prostate CA metastatic to bone. S/p RT and on Xtandi           Medication Adjustments/Labs and Tests Ordered: Current medicines are reviewed at length with the patient today.  Concerns regarding medicines are outlined above.  No orders of the defined types were placed in this encounter.  Meds ordered this encounter  Medications   sacubitril-valsartan  (ENTRESTO) 97-103 MG    Sig: Take 1 tablet by mouth 2 (two) times daily.    Dispense:  180 tablet    Refill:  3   empagliflozin (JARDIANCE) 10 MG TABS tablet    Sig: Take 1 tablet (10 mg total) by mouth daily before breakfast.    Dispense:  90 tablet    Refill:  3    There are no Patient Instructions on file for this visit.   Signed, Tniyah Nakagawa, MD  12/11/2023 1:22 PM    [facility]

## 2023-12-11 ENCOUNTER — Encounter: Payer: Self-pay | Admitting: Cardiology

## 2023-12-11 ENCOUNTER — Ambulatory Visit: Attending: Cardiology | Admitting: Cardiology

## 2023-12-11 ENCOUNTER — Other Ambulatory Visit: Payer: Self-pay | Admitting: Cardiology

## 2023-12-11 VITALS — BP 132/60 | HR 60 | Ht 69.0 in | Wt 200.0 lb

## 2023-12-11 DIAGNOSIS — I5022 Chronic systolic (congestive) heart failure: Secondary | ICD-10-CM

## 2023-12-11 DIAGNOSIS — E7800 Pure hypercholesterolemia, unspecified: Secondary | ICD-10-CM

## 2023-12-11 DIAGNOSIS — I255 Ischemic cardiomyopathy: Secondary | ICD-10-CM | POA: Diagnosis not present

## 2023-12-11 DIAGNOSIS — I25708 Atherosclerosis of coronary artery bypass graft(s), unspecified, with other forms of angina pectoris: Secondary | ICD-10-CM | POA: Diagnosis not present

## 2023-12-11 MED ORDER — SACUBITRIL-VALSARTAN 97-103 MG PO TABS: 1.0000 | ORAL_TABLET | Freq: Two times a day (BID) | ORAL | 3 refills | Status: DC

## 2023-12-11 MED ORDER — EMPAGLIFLOZIN 10 MG PO TABS
10.0000 mg | ORAL_TABLET | Freq: Every day | ORAL | 3 refills | Status: AC
Start: 2023-12-11 — End: ?

## 2023-12-11 NOTE — Patient Instructions (Addendum)
 Medication Instructions:  Stop Valsartan  Start Entresto  97/103 mg twice a day Start Jardiance  10 mg every morning before breakfast *If you need a refill on your cardiac medications before your next appointment, please call your pharmacy*  Lab Work: None ordered  Testing/Procedures: None ordered  Follow-Up: At [facility], you and your health needs are our priority.  As part of our continuing mission to provide you with exceptional heart care, our providers are all part of one team.  This team includes your primary Cardiologist (physician) and Advanced Practice Providers or APPs (Physician Assistants and Nurse Practitioners) who all work together to provide you with the care you need, when you need it.  Your next appointment:  3 months   Friday  2/6 at 1:20 pm    Provider:  Dr.Jordan   We recommend signing up for the patient portal called MyChart.  Sign up information is provided on this After Visit Summary.  MyChart is used to connect with patients for Virtual Visits (Telemedicine).  Patients are able to view lab/test results, encounter notes, upcoming appointments, etc.  Non-urgent messages can be sent to your provider as well.   To learn more about what you can do with MyChart, go to [email or web address].

## 2023-12-15 MED ORDER — ENTRESTO 97-103 MG PO TABS: 1.0000 | ORAL_TABLET | Freq: Two times a day (BID) | ORAL | 3 refills | Status: DC

## 2023-12-15 NOTE — Telephone Encounter (Signed)
 Pt of Dr. Jordan. Please address.

## 2024-01-05 ENCOUNTER — Encounter

## 2024-02-01 ENCOUNTER — Other Ambulatory Visit: Payer: Self-pay | Admitting: Internal Medicine

## 2024-02-26 NOTE — Progress Notes (Unsigned)
 " Cardiology Office Note:    Date:  02/27/2024   ID:  CAROLE DEERE, DOB 26-Dec-1945, MRN [id number]  PCP:  Vicci Barnie NOVAK, MD   [facility] Providers Cardiologist:  Dameer Speiser, MD     Referring MD: Vicci Barnie NOVAK, MD   Chief Complaint  Patient presents with   Follow-up   Chronic systolic (congestive) heart failure (HCC)    History of Present Illness:    MEL LANGAN is a 79 y.o. male  with PMH of CAD, ischemic cardiomyopathy s/p ICD,  HTN, HLD and DM II.  He had a remote PCI in the 90s and then CABG in 2000 at [facility].  He went back to the hospital in November 2018 with chest pain, respiratory failure, hypertensive emergency and persistent right of SVT.  There was some concern of STEMI with ST elevation in V1-V2.  He was intubated and underwent emergent cardiac catheterization which did not reveal any culprit lesion.  It showed 2 patent grafts with chronic RCA graft occlusion with collaterals, EF 25 to 30% with grade 1 DD.  Medical therapy was recommended.  Patient was readmitted in December 2018 with bradycardia, frequent urination and repetitive runs of SVT and was started on amiodarone . Amlodipine  was added due to elevated blood pressure.  Repeat echocardiogram in March 2019 showed EF 30 to 35%, he was referred to EP for ICD implantation.  He eventually underwent ICD implantation by Dr. Waddell on 04/14/2017.   Amiodarone  was stopped due to elevated TSH and liver function.  He does have a history of allergic reaction with hydralazine .  Since the last visit, he has been diagnosed with diabetes. He was previously not a candidate for Entresto  or Jardiance  due to cost.   He was last seen by me for a virtual visit in March 2023. He has been followed by EP for his ICD.   Repeat Echo in March showed EF 25-30%. We switched valsartan  to Entresto , added Jardiance . Already on Toprol  and lasix . Also recommended adding Zetia  but apparently that did not happen. Hasn't had his device  checked in over a year. No longer on Xtandi. PSA 0. Does note dizziness today. No increase in SOB or edema. No chest pain.   Past Medical History:  Diagnosis Date   Coronary artery disease    Hyperlipidemia    Hypertension    MI (myocardial infarction) [facility])     Past Surgical History:  Procedure Laterality Date   CORONARY ARTERY BYPASS GRAFT     ICD IMPLANT N/A 04/14/2017   Procedure: ICD IMPLANT;  Surgeon: Waddell Danelle ORN, MD;  Location: [facility] INVASIVE CV LAB;  Service: Cardiovascular;  Laterality: N/A;   LEFT HEART CATH AND CORONARY ANGIOGRAPHY N/A 12/16/2016   Procedure: LEFT HEART CATH AND CORONARY ANGIOGRAPHY;  Surgeon: Marline Morace M, MD;  Location: [facility] INVASIVE CV LAB;  Service: Cardiovascular;  Laterality: N/A;    Current Medications: Current Meds  Medication Sig   aspirin  EC 81 MG EC tablet Take 1 tablet (81 mg total) by mouth daily.   empagliflozin  (JARDIANCE ) 10 MG TABS tablet Take 1 tablet (10 mg total) by mouth daily before breakfast.   ezetimibe  (ZETIA ) 10 MG tablet Take 1 tablet (10 mg total) by mouth daily.   furosemide  (LASIX ) 40 MG tablet Take 1 tablet (40 mg total) by mouth daily.   glipiZIDE-metformin (METAGLIP) 2.5-500 MG tablet Take 1 tablet by mouth 2 (two) times daily.   metoprolol  succinate (TOPROL -XL) 50 MG 24 hr  tablet Take 1 tablet (50 mg total) by mouth daily. Take with or immediately following a meal.   Multiple Vitamin (MULTIVITAMIN) tablet Take 1 tablet by mouth daily.   nitroGLYCERIN  (NITROSTAT ) 0.4 MG SL tablet Place 1 tablet (0.4 mg total) under the tongue every 5 (five) minutes x 3 doses as needed for chest pain.   sacubitril -valsartan  (ENTRESTO ) 49-51 MG Take 1 tablet by mouth 2 (two) times daily.   tamsulosin (FLOMAX) 0.4 MG CAPS capsule Take 0.4 mg by mouth at bedtime.   [DISCONTINUED] ENTRESTO  97-103 MG Take 1 tablet by mouth 2 (two) times daily.   [DISCONTINUED] sacubitril -valsartan  (ENTRESTO ) 97-103 MG Take 1 tablet by mouth 2 (two) times daily.      Allergies:   Hydralazine  hcl   Social History   Socioeconomic History   Marital status: Divorced    Spouse name: [name]   Number of children: [name]   Years of education: [name]   Highest education level: [name]  Occupational History   [name]  Tobacco Use   Smoking status: Former    Current packs/day: 0.00    Types: Cigarettes    Quit date: 12/11/2016    Years since quitting: 7.2   Smokeless tobacco: Never  Vaping Use   Vaping status: Never Used  Substance and Sexual Activity   Alcohol use: No   Drug use: No   Sexual activity: [name]  Other Topics Concern   [name]  Social History Narrative   [name]   Social Drivers of Health   Tobacco Use: Medium Risk (02/27/2024)   Patient History    Smoking Tobacco Use: Former    Smokeless Tobacco Use: Never    Passive Exposure: Not on Actuary Strain: [name]  Food Insecurity: [name]  Transportation Needs: [name]  Physical Activity: [name]  Stress: [name]  Social Connections: [name]  Depression (EYV7-0): [name]  Alcohol Screen: [name]  Housing: Low Risk (02/03/2024)   Received from [facility]   Epic    What is your living situation today?: I have a steady place to live    Think about the place you live. Do you have problems with any of the following? Choose all that apply:: [name]  Utilities: [name]  Health Literacy: [name]     Family History: The patient's family history includes Heart disease in his mother.  ROS:   Please see the history of present illness.     All other systems reviewed and are negative.  EKGs/Labs/Other Studies Reviewed:    The following studies were reviewed today: EKG Interpretation Date/Time:  Friday February 27 2024 13:16:55 EST Ventricular Rate:  66 PR Interval:  218 QRS Duration:  96 QT Interval:  416 QTC Calculation: 436 R Axis:   -10  Text Interpretation: Sinus  rhythm with 1st degree A-V block Inferior infarct (cited on or before 15-Apr-2017) Anteroseptal infarct (cited on or before 15-Apr-2017) T wave abnormality, consider lateral ischemia When compared with ECG of 07-Feb-2023 15:16, Inverted T waves have replaced nonspecific T wave abnormality in Inferior leads Confirmed by Aveah Castell [phone]) on 02/27/2024 1:20:42 PM   Echo 11/19/23: IMPRESSIONS     1. Left ventricular ejection fraction, by estimation, is 25 to 30%. The  left ventricle has severely decreased function. The left ventricle  demonstrates regional wall  motion abnormalities (see scoring  diagram/findings for description). The left  ventricular internal cavity size was mildly dilated. Left ventricular  diastolic parameters were normal.   2. Right ventricular systolic function is mildly reduced. The right  ventricular size is moderately enlarged.   3. Left atrial size was mildly dilated.   4. The mitral valve is normal in structure. Trivial mitral valve  regurgitation.   5. The aortic valve is calcified. Aortic valve regurgitation is not  visualized. Aortic valve sclerosis is present, with no evidence of aortic  valve stenosis.   6. Aortic dilatation noted. There is mild dilatation of the ascending  aorta, measuring 40 mm.   Comparison(s): Unable to view 2019 study for comparison.   Recent Labs: 11/10/2023: BUN 31; Creatinine, Ser 1.16; Potassium 5.1; Sodium 138 11/19/2023: ALT 9  Recent Lipid Panel    Component Value Date/Time   CHOL 144 11/19/2023 1253   TRIG 90 11/19/2023 1253   HDL 43 11/19/2023 1253   CHOLHDL 3.3 11/19/2023 1253   CHOLHDL 6.5 12/17/2016 0629   VLDL 46 (H) 12/17/2016 0629   LDLCALC 84 11/19/2023 1253   Dated 02/03/24: BUN 43, creatinine 1.55. GFR 46. Otherwise normal CMET. Hgb 12.1. PSA 0  Risk Assessment/Calculations:                Physical Exam:    VS:  BP (!) 92/44 (BP Location: Right Arm, Patient Position: Sitting, Cuff Size: Normal)    Pulse 66   Resp 17   Ht 5' 9 (1.753 m)   Wt 200 lb (90.7 kg)   SpO2 95%   BMI 29.53 kg/m     Wt Readings from Last 3 Encounters:  02/27/24 200 lb (90.7 kg)  12/11/23 200 lb (90.7 kg)  10/13/23 196 lb 1.6 oz (89 kg)     GEN:  Well nourished, well developed in no acute distress HEENT: Normal NECK: No JVD; No carotid bruits LYMPHATICS: No lymphadenopathy CARDIAC: RRR, no murmurs, rubs, gallops RESPIRATORY:  Clear to auscultation without rales, wheezing or rhonchi  ABDOMEN: Soft, non-tender, non-distended MUSCULOSKELETAL:  tr  edema; No deformity  SKIN: Warm and dry NEUROLOGIC:  Alert and oriented x 3 PSYCHIATRIC:  Normal affect   ASSESSMENT:    1. Chronic systolic (congestive) heart failure (HCC)   2. Coronary artery disease of bypass graft of native heart with stable angina pectoris   3. Ischemic cardiomyopathy   4. NSVT (nonsustained ventricular tachycardia) (HCC)   5. Hypercholesteremia      PLAN:    In order of problems listed above:  Chronic systolic CHF due to ischemic cardiomyopathy. EF down to 25-30%. ICD in place. BP low today. Will reduce Entresto  dose today to 49/51 mg daily. continue Jardiance  10 mg daily.  On Toprol  XL 50 mg daily. Take lasix  20 mg daily. Restrict sodium. Follow up in 4 months.  S/p ICD for prevention. Needs follow up with EP - will arrange CAD extensive history as noted in HPI. S/p prior PCIs and CABG. No active angina. Continue metoprolol , ASA, statin HLD. Goal LDL < 55. On high dose lipitor . Will add Zetia  10 mg daily. Repeat fasting lab in 3 months.  DM controlled.  Prostate CA metastatic to bone. S/p RT      Follow up 4 months.      Medication Adjustments/Labs and Tests Ordered: Current medicines are reviewed at length with the patient today.  Concerns regarding medicines are outlined above.  Orders Placed This Encounter  Procedures  Comp Met (CMET)   Lipid panel   EKG 12-Lead   Meds ordered this encounter  Medications    sacubitril -valsartan  (ENTRESTO ) 49-51 MG    Sig: Take 1 tablet by mouth 2 (two) times daily.    Dispense:  180 tablet    Refill:  3   ezetimibe  (ZETIA ) 10 MG tablet    Sig: Take 1 tablet (10 mg total) by mouth daily.    Dispense:  90 tablet    Refill:  3    Patient Instructions  Medication Instructions:   *If you need a refill on your cardiac medications before your next appointment, please call your pharmacy*  Lab Work:  If you have labs (blood work) drawn today and your tests are completely normal, you will receive your results only by: MyChart Message (if you have MyChart) OR A paper copy in the mail If you have any lab test that is abnormal or we need to change your treatment, we will call you to review the results.  Testing/Procedures:   Follow-Up: At [facility], you and your health needs are our priority.  As part of our continuing mission to provide you with exceptional heart care, our providers are all part of one team.  This team includes your primary Cardiologist (physician) and Advanced Practice Providers or APPs (Physician Assistants and Nurse Practitioners) who all work together to provide you with the care you need, when you need it.  Your next appointment:      Provider:     We recommend signing up for the patient portal called MyChart.  Sign up information is provided on this After Visit Summary.  MyChart is used to connect with patients for Virtual Visits (Telemedicine).  Patients are able to view lab/test results, encounter notes, upcoming appointments, etc.  Non-urgent messages can be sent to your provider as well.   To learn more about what you can do with MyChart, go to [email or web address].             Signed, Detra Bores, MD  02/27/2024 1:35 PM    [facility]  "

## 2024-02-27 ENCOUNTER — Ambulatory Visit: Admitting: Cardiology

## 2024-02-27 ENCOUNTER — Encounter: Payer: Self-pay | Admitting: Cardiology

## 2024-02-27 VITALS — BP 92/44 | HR 66 | Resp 17 | Ht 69.0 in | Wt 200.0 lb

## 2024-02-27 DIAGNOSIS — I4729 Other ventricular tachycardia: Secondary | ICD-10-CM

## 2024-02-27 DIAGNOSIS — E7800 Pure hypercholesterolemia, unspecified: Secondary | ICD-10-CM

## 2024-02-27 DIAGNOSIS — I25708 Atherosclerosis of coronary artery bypass graft(s), unspecified, with other forms of angina pectoris: Secondary | ICD-10-CM

## 2024-02-27 DIAGNOSIS — I5022 Chronic systolic (congestive) heart failure: Secondary | ICD-10-CM

## 2024-02-27 DIAGNOSIS — I255 Ischemic cardiomyopathy: Secondary | ICD-10-CM

## 2024-02-27 MED ORDER — EZETIMIBE 10 MG PO TABS: 10.0000 mg | ORAL_TABLET | Freq: Every day | ORAL | 3 refills | Status: AC

## 2024-02-27 MED ORDER — SACUBITRIL-VALSARTAN 49-51 MG PO TABS: 1.0000 | ORAL_TABLET | Freq: Two times a day (BID) | ORAL | 3 refills | Status: AC

## 2024-02-27 NOTE — Patient Instructions (Addendum)
 Medication Instructions:  Decrease Entresto  49/51 mg twice a day Start Zetia  10 mg daily Continue all other medications *If you need a refill on your cardiac medications before your next appointment, please call your pharmacy*  Lab Work: Cmet and lipid panel have done fasting in 3 months   Testing/Procedures: None ordered  Follow-Up: At [facility], you and your health needs are our priority.  As part of our continuing mission to provide you with exceptional heart care, our providers are all part of one team.  This team includes your primary Cardiologist (physician) and Advanced Practice Providers or APPs (Physician Assistants and Nurse Practitioners) who all work together to provide you with the care you need, when you need it.  Your next appointment:  4 months   Tuesday 6/2 at 1:40 pm    Provider:  Dr.Jordan          [facility] Monday 3/9 at 4:15 pm   We recommend signing up for the patient portal called MyChart.  Sign up information is provided on this After Visit Summary.  MyChart is used to connect with patients for Virtual Visits (Telemedicine).  Patients are able to view lab/test results, encounter notes, upcoming appointments, etc.  Non-urgent messages can be sent to your provider as well.   To learn more about what you can do with MyChart, go to [email or web address].

## 2024-03-29 ENCOUNTER — Ambulatory Visit: Admitting: Cardiology

## 2024-04-05 ENCOUNTER — Encounter

## 2024-06-22 ENCOUNTER — Ambulatory Visit: Admitting: Cardiology

## 2024-07-05 ENCOUNTER — Encounter

## 2024-10-04 ENCOUNTER — Encounter
